# Patient Record
Sex: Male | Born: 1950 | Race: White | Hispanic: No | Marital: Married | Smoking: Former smoker
Health system: Southern US, Community
[De-identification: ages and names within clinical notes are randomized; demographics above are authoritative.]

## PROBLEM LIST (undated history)

## (undated) DIAGNOSIS — C801 Malignant (primary) neoplasm, unspecified: Secondary | ICD-10-CM

## (undated) DIAGNOSIS — Z87442 Personal history of urinary calculi: Secondary | ICD-10-CM

## (undated) DIAGNOSIS — G4730 Sleep apnea, unspecified: Secondary | ICD-10-CM

## (undated) DIAGNOSIS — M1990 Unspecified osteoarthritis, unspecified site: Secondary | ICD-10-CM

## (undated) DIAGNOSIS — I219 Acute myocardial infarction, unspecified: Secondary | ICD-10-CM

## (undated) DIAGNOSIS — G458 Other transient cerebral ischemic attacks and related syndromes: Secondary | ICD-10-CM

## (undated) DIAGNOSIS — J189 Pneumonia, unspecified organism: Secondary | ICD-10-CM

## (undated) DIAGNOSIS — M21371 Foot drop, right foot: Secondary | ICD-10-CM

## (undated) DIAGNOSIS — R011 Cardiac murmur, unspecified: Secondary | ICD-10-CM

## (undated) DIAGNOSIS — I2510 Atherosclerotic heart disease of native coronary artery without angina pectoris: Secondary | ICD-10-CM

## (undated) DIAGNOSIS — E785 Hyperlipidemia, unspecified: Secondary | ICD-10-CM

## (undated) DIAGNOSIS — H539 Unspecified visual disturbance: Secondary | ICD-10-CM

## (undated) DIAGNOSIS — I10 Essential (primary) hypertension: Secondary | ICD-10-CM

## (undated) DIAGNOSIS — M25511 Pain in right shoulder: Secondary | ICD-10-CM

## (undated) HISTORY — DX: Pain in right shoulder: M25.511

## (undated) HISTORY — DX: Hyperlipidemia, unspecified: E78.5

## (undated) HISTORY — PX: KNEE SURGERY: SHX244

## (undated) HISTORY — DX: Atherosclerotic heart disease of native coronary artery without angina pectoris: I25.10

## (undated) HISTORY — PX: TONSILLECTOMY: SUR1361

## (undated) HISTORY — DX: Unspecified osteoarthritis, unspecified site: M19.90

## (undated) HISTORY — PX: OTHER SURGICAL HISTORY: SHX169

## (undated) HISTORY — DX: Acute myocardial infarction, unspecified: I21.9

## (undated) HISTORY — DX: Unspecified visual disturbance: H53.9

## (undated) HISTORY — DX: Foot drop, right foot: M21.371

## (undated) HISTORY — DX: Essential (primary) hypertension: I10

---

## 1993-06-11 HISTORY — PX: KNEE SURGERY: SHX244

## 1993-06-11 HISTORY — PX: FRACTURE SURGERY: SHX138

## 2007-02-11 ENCOUNTER — Inpatient Hospital Stay (HOSPITAL_COMMUNITY): Admission: AD | Admit: 2007-02-11 | Discharge: 2007-02-19 | Payer: Self-pay | Admitting: Cardiology

## 2007-02-11 ENCOUNTER — Ambulatory Visit: Payer: Self-pay | Admitting: Cardiology

## 2007-02-12 ENCOUNTER — Ambulatory Visit: Payer: Self-pay | Admitting: Thoracic Surgery (Cardiothoracic Vascular Surgery)

## 2007-02-12 ENCOUNTER — Encounter: Payer: Self-pay | Admitting: Thoracic Surgery (Cardiothoracic Vascular Surgery)

## 2007-02-13 ENCOUNTER — Encounter: Payer: Self-pay | Admitting: Thoracic Surgery (Cardiothoracic Vascular Surgery)

## 2007-02-14 HISTORY — PX: CORONARY ARTERY BYPASS GRAFT: SHX141

## 2007-03-05 ENCOUNTER — Ambulatory Visit: Payer: Self-pay | Admitting: Internal Medicine

## 2007-03-18 ENCOUNTER — Encounter
Admission: RE | Admit: 2007-03-18 | Discharge: 2007-03-18 | Payer: Self-pay | Admitting: Thoracic Surgery (Cardiothoracic Vascular Surgery)

## 2007-03-18 ENCOUNTER — Ambulatory Visit: Payer: Self-pay | Admitting: Thoracic Surgery (Cardiothoracic Vascular Surgery)

## 2007-04-29 ENCOUNTER — Ambulatory Visit: Payer: Self-pay | Admitting: Cardiovascular Disease

## 2008-01-05 ENCOUNTER — Ambulatory Visit: Payer: Self-pay | Admitting: Cardiovascular Disease

## 2008-01-15 ENCOUNTER — Ambulatory Visit: Payer: Self-pay

## 2008-01-15 ENCOUNTER — Encounter: Payer: Self-pay | Admitting: Cardiovascular Disease

## 2008-09-22 ENCOUNTER — Telehealth: Payer: Self-pay | Admitting: Cardiovascular Disease

## 2008-09-28 DIAGNOSIS — R5383 Other fatigue: Secondary | ICD-10-CM

## 2008-09-28 DIAGNOSIS — E119 Type 2 diabetes mellitus without complications: Secondary | ICD-10-CM | POA: Insufficient documentation

## 2008-09-28 DIAGNOSIS — E785 Hyperlipidemia, unspecified: Secondary | ICD-10-CM | POA: Insufficient documentation

## 2008-09-28 DIAGNOSIS — M1990 Unspecified osteoarthritis, unspecified site: Secondary | ICD-10-CM | POA: Insufficient documentation

## 2008-09-28 DIAGNOSIS — R5381 Other malaise: Secondary | ICD-10-CM

## 2008-09-28 DIAGNOSIS — R0789 Other chest pain: Secondary | ICD-10-CM | POA: Insufficient documentation

## 2008-09-28 DIAGNOSIS — I10 Essential (primary) hypertension: Secondary | ICD-10-CM

## 2008-09-29 ENCOUNTER — Encounter: Payer: Self-pay | Admitting: Cardiovascular Disease

## 2008-09-29 ENCOUNTER — Ambulatory Visit: Payer: Self-pay | Admitting: Cardiovascular Disease

## 2008-09-29 DIAGNOSIS — R0989 Other specified symptoms and signs involving the circulatory and respiratory systems: Secondary | ICD-10-CM | POA: Insufficient documentation

## 2008-09-29 DIAGNOSIS — I2510 Atherosclerotic heart disease of native coronary artery without angina pectoris: Secondary | ICD-10-CM

## 2008-10-13 ENCOUNTER — Ambulatory Visit: Payer: Self-pay | Admitting: Cardiovascular Disease

## 2008-12-01 ENCOUNTER — Telehealth: Payer: Self-pay | Admitting: Cardiovascular Disease

## 2008-12-07 ENCOUNTER — Telehealth (INDEPENDENT_AMBULATORY_CARE_PROVIDER_SITE_OTHER): Payer: Self-pay | Admitting: *Deleted

## 2008-12-08 ENCOUNTER — Ambulatory Visit: Payer: Self-pay

## 2008-12-08 ENCOUNTER — Encounter: Payer: Self-pay | Admitting: Cardiology

## 2008-12-08 ENCOUNTER — Encounter: Payer: Self-pay | Admitting: Cardiovascular Disease

## 2008-12-22 ENCOUNTER — Encounter: Payer: Self-pay | Admitting: Cardiovascular Disease

## 2008-12-28 ENCOUNTER — Encounter: Payer: Self-pay | Admitting: Cardiovascular Disease

## 2009-01-05 ENCOUNTER — Encounter: Payer: Self-pay | Admitting: Cardiovascular Disease

## 2009-01-14 ENCOUNTER — Encounter: Payer: Self-pay | Admitting: Cardiovascular Disease

## 2009-02-01 ENCOUNTER — Encounter: Admission: RE | Admit: 2009-02-01 | Discharge: 2009-02-01 | Payer: Self-pay | Admitting: Endocrinology

## 2009-02-01 ENCOUNTER — Other Ambulatory Visit: Admission: RE | Admit: 2009-02-01 | Discharge: 2009-02-01 | Payer: Self-pay | Admitting: Interventional Radiology

## 2009-02-01 ENCOUNTER — Encounter (INDEPENDENT_AMBULATORY_CARE_PROVIDER_SITE_OTHER): Payer: Self-pay | Admitting: Interventional Radiology

## 2009-02-10 ENCOUNTER — Telehealth (INDEPENDENT_AMBULATORY_CARE_PROVIDER_SITE_OTHER): Payer: Self-pay | Admitting: *Deleted

## 2009-05-10 ENCOUNTER — Ambulatory Visit: Admission: RE | Admit: 2009-05-10 | Discharge: 2009-05-25 | Payer: Self-pay | Admitting: Radiation Oncology

## 2009-06-17 ENCOUNTER — Ambulatory Visit: Admission: RE | Admit: 2009-06-17 | Discharge: 2009-09-15 | Payer: Self-pay | Admitting: Radiation Oncology

## 2009-07-18 ENCOUNTER — Telehealth (INDEPENDENT_AMBULATORY_CARE_PROVIDER_SITE_OTHER): Payer: Self-pay | Admitting: *Deleted

## 2009-07-21 ENCOUNTER — Ambulatory Visit (HOSPITAL_BASED_OUTPATIENT_CLINIC_OR_DEPARTMENT_OTHER): Admission: RE | Admit: 2009-07-21 | Discharge: 2009-07-21 | Payer: Self-pay | Admitting: Urology

## 2009-08-11 ENCOUNTER — Encounter (INDEPENDENT_AMBULATORY_CARE_PROVIDER_SITE_OTHER): Payer: Self-pay | Admitting: *Deleted

## 2009-10-18 ENCOUNTER — Ambulatory Visit: Admission: RE | Admit: 2009-10-18 | Discharge: 2009-10-23 | Payer: Self-pay | Admitting: Radiation Oncology

## 2010-07-03 ENCOUNTER — Encounter: Payer: Self-pay | Admitting: Endocrinology

## 2010-07-13 NOTE — Letter (Signed)
Summary: Appointment - Reminder 2  [employer], Main Office  [address]   [place], [place] [address]   Phone: [phone]  Fax: [phone]     August 11, 2009 MRN: [id number]   BERNADETTE ARMIJO [address] [place], [place]  [address]   Dear Mr. BAUTCH,  Our records indicate that it is time to schedule a follow-up appointment with Dr. Eden Emms. It is very important that we reach you to schedule this appointment. We look forward to participating in your health care needs. Please contact us at the number listed above at your earliest convenience to schedule your appointment.  If you are unable to make an appointment at this time, give [place] a call so we can update our records.     Sincerely,   Migdalia Dk [facility] Scheduling Team

## 2010-07-13 NOTE — Progress Notes (Signed)
  Phone Note From Other Clinic   Caller: [facility] Details for Reason: Pt.Information Initial call taken by: Denny Peon    Faxed all Cardiac over to [phone] [facility]  July 18, 2009 11:10 AM

## 2010-08-18 ENCOUNTER — Encounter: Payer: Self-pay | Admitting: Cardiovascular Disease

## 2010-08-30 ENCOUNTER — Ambulatory Visit: Payer: Self-pay | Admitting: Cardiovascular Disease

## 2010-08-31 LAB — COMPREHENSIVE METABOLIC PANEL
AST: 18 U/L (ref 0–37)
Albumin: 4 g/dL (ref 3.5–5.2)
Alkaline Phosphatase: 92 U/L (ref 39–117)
CO2: 29 mEq/L (ref 19–32)
GFR calc Af Amer: >60 mL/min (ref >60)
Total Protein: 6.4 g/dL (ref 6.0–8.3)

## 2010-08-31 LAB — PROTIME-INR: Prothrombin Time: 12.4 seconds (ref 11.6–15.2)

## 2010-08-31 LAB — CBC
HCT: 39.8 % (ref 39.0–52.0)
Hemoglobin: 13.7 g/dL (ref 13.0–17.0)
MCHC: 34.4 g/dL (ref 30.0–36.0)
MCV: 95.6 fL (ref 78.0–100.0)
Platelets: 278 10*3/uL (ref 150–400)
RBC: 4.17 MIL/uL — ABNORMAL LOW (ref 4.22–5.81)
RDW: 12.9 % (ref 11.5–15.5)

## 2010-09-21 ENCOUNTER — Ambulatory Visit (INDEPENDENT_AMBULATORY_CARE_PROVIDER_SITE_OTHER): Payer: BC Managed Care – PPO | Admitting: Cardiovascular Disease

## 2010-09-21 ENCOUNTER — Encounter: Payer: Self-pay | Admitting: Cardiovascular Disease

## 2010-09-21 DIAGNOSIS — E785 Hyperlipidemia, unspecified: Secondary | ICD-10-CM

## 2010-09-21 DIAGNOSIS — R0989 Other specified symptoms and signs involving the circulatory and respiratory systems: Secondary | ICD-10-CM

## 2010-09-21 DIAGNOSIS — I2510 Atherosclerotic heart disease of native coronary artery without angina pectoris: Secondary | ICD-10-CM

## 2010-09-21 DIAGNOSIS — I10 Essential (primary) hypertension: Secondary | ICD-10-CM

## 2010-09-21 NOTE — Assessment & Plan Note (Signed)
Mild myalgias on crestor better than others F/U labs primary

## 2010-09-21 NOTE — Progress Notes (Signed)
Jason Bailey is seen today in followup for coronary artery disease.  Previous CABG . He had a nonischemic Myoview 2009 with normal LV function. He is much better than last time I saw him He has a new endocrine doctor in [place] and is off actos.  His thyroid nodules are stable.  In 4/10 he had a normal carotid duplex despite right bruit.  Active with no SSCP.  Compliant with meds.  Primary checking cholesterol  A1c in 6 range.    ROS: Denies fever, malais, weight loss, blurry vision, decreased visual acuity, cough, sputum, SOB, hemoptysis, pleuritic pain, palpitaitons, heartburn, abdominal pain, melena, lower extremity edema, claudication, or rash.   General: Affect appropriate Healthy:  appears stated age HEENT: normal Neck supple with no adenopathy JVP normal right  bruits no thyromegaly Lungs clear with no wheezing and good diaphragmatic motion Heart:  S1/S2 no murmur,rub, gallop or click PMI normal Abdomen: benighn, BS positve, no tenderness, no AAA no bruit.  No HSM or HJR Distal pulses intact with no bruits No edema Neuro non-focal Skin warm and dry No muscular weakness   Current Outpatient Prescriptions  Medication Sig Dispense Refill  . aspirin 81 MG tablet Take 81 mg by mouth daily.        . Cholecalciferol (VITAMIN D) 1000 UNITS capsule Take 1,000 Units by mouth daily.        . fish oil-omega-3 fatty acids 1000 MG capsule Take by mouth daily.        Marland Kitchen glipiZIDE (GLUCOTROL) 10 MG tablet Take 10 mg by mouth daily.        . metFORMIN (GLUMETZA) 500 MG (MOD) 24 hr tablet Take 500 mg by mouth 2 (two) times daily with a meal.        . pravastatin (PRAVACHOL) 10 MG tablet Take 10 mg by mouth daily.        . saxagliptin HCl (ONGLYZA) 5 MG TABS tablet Take 5 mg by mouth daily.        Marland Kitchen testosterone cypionate (DEPOTESTOTERONE CYPIONATE) 200 MG/ML injection Inject 50 mg into the muscle every 14 (fourteen) days.        . vitamin B-12 (CYANOCOBALAMIN) 500 MCG tablet Take 500 mcg by mouth  daily.        Marland Kitchen DISCONTD: pioglitazone (ACTOS) 45 MG tablet Take 45 mg by mouth daily.        Marland Kitchen DISCONTD: rosuvastatin (CRESTOR) 5 MG tablet Take 5 mg by mouth daily.          Allergies  Review of patient's allergies indicates no known allergies.  Electrocardiogram:  NSR 78 normal ECG  Assessment and Plan

## 2010-09-21 NOTE — Assessment & Plan Note (Signed)
Well controlled.  Continue current medications and low sodium Dash type diet.    

## 2010-09-21 NOTE — Assessment & Plan Note (Signed)
stabel no angina

## 2010-09-21 NOTE — Assessment & Plan Note (Signed)
Persistant right bruit.  No disease on duplex 2010  F/U duplex 2 years

## 2010-10-24 NOTE — Letter (Signed)
March 18, 2007   Noralyn Pick. Eden Emms, MD, [facility]  [address]  [address]  [place], [place] [address]   Re:  KENDRICKS, REAP                 DOB:  [birth year]   Dear Cindee Lame:   I saw Adrian Blackwater back in the office today.  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  At catheterization you found he had a 50% left main  stenosis and three-vessel coronary disease.  He underwent coronary  bypass grafting on September 5.  He did well in the early postoperative  period and has continued to do well since he has been discharged home.  His biggest complaints have been from muscle cramping with both Lipitor  and Zocor, so he is not currently on a statin medication because of that  intolerance.  Otherwise, he is doing quite well.   Thank you for allowing me to see Mr. Heyer and participate in his care.   Salvatore Decent Dorris Fetch, M.D.  Electronically Signed   SCH/MEDQ  D:  03/18/2007  T:  03/19/2007  Job:  [id number]   cc:   Nicolasa Ducking, MD

## 2010-10-24 NOTE — Cardiovascular Report (Signed)
NAMESILVESTRE, MINES NO.:  [id number]   MEDICAL RECORD NO.:  [id number]          PATIENT TYPE:  INP   LOCATION:  3731                         FACILITY:  [facility]   PHYSICIAN:  Noralyn Pick. Eden Emms, MD, FACCDATE OF BIRTH:  Jul 27, 1950   DATE OF PROCEDURE:  02/12/2007  DATE OF DISCHARGE:                            CARDIAC CATHETERIZATION   PROCEDURE PERFORMED:  Cardiac catheterization.   CARDIOLOGIST:  Noralyn Pick. Eden Emms, M.D., [facility]   INDICATIONS:  Unstable angina in a 60 year old diabetic.   DESCRIPTION OF THE PROCEDURE:  Cine catheterization is done with 6  French catheters from right femoral artery.  There was some oozing from  the arteriotomy site.  There was no hematoma.  At the end of the case we  had good hemostasis with Angio-Seal.   RESULTS:   ANGIOGRAPHIC DATA:  Left Main Coronary Artery:  The left main coronary  artery had a 50% eccentric stenosis.   Left Anterior Descending:  The left anterior descending artery 20-30%  multiple discrete lesions proximally.  The midvessel had 70% discrete  disease.  The distal vessel was large and graftable.  The first diagonal  branch had 70% multiple discrete lesions.   Circumflex:  The circumflex coronary artery had an 80% lesion in the  midvessel.  This was prior to the takeoff of a large first obtuse  marginal branch.  There appeared to be some left-to-right collaterals to  the distal right coronary artery.   Right Coronary Artery:  The right coronary artery had a long stenosis in  the proximal to midvessel.  I  could not tell if it was subtotally  occluded although it looked like a high-grade 95% lesion with TIMI II  flow.  Although the PDA and posterior lateral branch were small, there  appeared to be competitive flow from collaterals, and I believe the  vessel to be graftable.   Ventriculogram:  The RAO ventriculography and LAO ventriculography  showed preserved LV function with an EF of 55%.  There were no  discrete  wall motion abnormalities although the inferior basalar wall was  minimally hypokinetic.  There was no significant MR.   HEMODYNAMIC DATA:  Aortic pressure was equal to 135/62.  LV pressure was  135/11.   IMPRESSION:  The patient is a diabetic with multivessel disease  including moderate left main disease.  He has TIMI II flow in the right  coronary artery over a very long lesion, which I do not think is able to  be revascularized.  I  think his best option would be coronary bypass surgery.  I will have Dr.  Juanda Chance review the films, but  make tentative plans for CVTS to see the  patient.   The patient tolerated the procedure well.      Noralyn Pick. Eden Emms, MD, [facility]  Electronically Signed     PCN/MEDQ  D:  02/12/2007  T:  02/13/2007  Job:  [id number]

## 2010-10-24 NOTE — Assessment & Plan Note (Signed)
[facility]                            CARDIOLOGY OFFICE NOTE   NAME:Jason Bailey, Jason Bailey                        MRN:          [id number]  DATE:03/05/2007                            DOB:          1950/08/16    This is a 60 year old married white male patient who presented to the  hospital with chest pain and underwent cardiac catheterization and  eventual CABG x4 on February 14, 2007. He had a lima to the LAD, SVG to  the first diagonal, SVG to the OM1, and SVG to the PDA. The patient did  well postoperatively. The patient says that he was placed on Toprol in  the hospital and when he got home, he became extremely dizzy and  fatigued. His blood pressures were running in the 80s, so he stopped  this. He also was placed on Lipitor and he had so much muscle pain in  his upper thighs and lower extremities that he stopped this. Within  three days, he was feeling better. He had been on Lipitor before and did  not tolerate it. His wife inquires about cardiac rehab and diet as his  sugars have been running over 200 on his current treatment.   CURRENT MEDICATIONS:  1. Aspirin 325 mg daily.  2. Glipizide 5 mg daily.  3. Glucophage 500 mg 2 b.i.d.  4. Aspirin 81 mg daily.   He has stopped both his Toprol and his Lipitor.   PHYSICAL EXAMINATION:  GENERAL:  This is a pleasant 60 year old white  male in no acute distress.  VITAL SIGNS:  Blood pressure 135/81, pulse 77, weight 166.  NECK:  Without JVD, HJR, bruit, or thyroid enlargement.  LUNGS:  Decreased breath sounds at the bilateral bases. Clear elsewhere.  HEART:  Regular rate and rhythm at 77 beats-per-minute. Normal S1, S2,  positive S4, and a 1/6 systolic ejection murmur at the left sternal  border.  ABDOMEN:  Soft without organomegaly, masses, lesions, or abnormal  tenderness.  EXTREMITIES:  Without cyanosis, clubbing, or edema. He has good distal  pulses. Incision sites are all healing well.   EKG  normal sinus rhythm. Non-specific T-wave changes. No acute change.   IMPRESSION:  1. Coronary artery disease status post CABG x4 on 02/14/2007.  2. Diabetes mellitus.  3. Intolerance to LIPITOR and TOPROL.   PLAN:  I have given the patient a prescription for Zocor 20 mg daily to  see if he can tolerate this. I have also referred him to cardiac rehab  in [facility] and asked him to follow up with Dr.  Heriberto Antigua, his primary care physician, concerning his diabetes. He will  see Dr. Eden Emms back in follow up in two months and is due to see the  surgeon back next week.      Jacolyn Reedy, PA-C  Electronically Signed      Duke Salvia, MD, [facility]  Electronically Signed   ML/MedQ  DD: 03/05/2007  DT: 03/06/2007  Job #: [id number]   cc:   Salvatore Decent. Dorris Fetch, M.D.  Chrissie Noa MD Heriberto Antigua

## 2010-10-24 NOTE — Discharge Summary (Signed)
NAMEDEXTER, SIGNOR NO.:  [id number]   MEDICAL RECORD NO.:  [id number]          PATIENT TYPE:  INP   LOCATION:  2031                         FACILITY:  [facility]   PHYSICIAN:  Salvatore Decent. Dorris Fetch, M.D.DATE OF BIRTH:  1950-10-06   DATE OF ADMISSION:  02/11/2007  DATE OF DISCHARGE:  02/19/2007                               DISCHARGE SUMMARY   HISTORY OF PRESENT ILLNESS:  This patient is a 60 year old white male  with no previous history of coronary artery disease.  He awoke on the  day of admission at approximately 12:30 a.m. with left-sided chest  heaviness, diaphoresis, nausea and vomiting x1 without shortness of  breath.  The pain was approximately 4 on a scale of 1-10.  He did feel  some improvement with sitting up.  No changes in symptoms with deep  cough or inspiration.  The duration of the symptoms lasted for  approximately 2 hours.  He also noted that the day before he had some  generalized malaise.  He presented to the [facility] Emergency  Department when he developed recurrence of the chest pain.  He received  2 sublingual nitroglycerin and became painfree.  He was felt to require  further evaluation and treatment and was transferred to [facility] under the care of the [facility] Card[facility] physicians for further  evaluation and treatment including cardiac catheterization.   ALLERGIES:  NO KNOWN DRUG ALLERGIES.   MEDICATIONS PRIOR TO ADMISSION:  1. Glucophage 500 mg b.i.d.  2. Glucotrol 5 mg daily.  3. Aspirin 81 mg daily.   PAST MEDICAL HISTORY:  1. Diabetes mellitus, type 2.  2. Tobacco abuse.  3. Osteoarthritis.   FAMILY HISTORY SOCIAL HISTORY REVIEW OF SYSTEMS AND PHYSICAL EXAM:  Please see the history and physical done at the time of admission.   HOSPITAL COURSE:  The patient was admitted.  He was started on  intravenous heparin through pharmacy dosing.  He was scheduled for  cardiac catheterization, which he underwent on  February 12, 2007.  He  was found to have severe multivessel coronary artery disease.  Findings  included a 50% left main stenosis.  He also had a 70% mid LAD lesion and  a 70% diagonal #1 lesion.  The circumflex revealed a 80% midvessel  lesion and the right coronary artery showed a 90% proximal lesion.  Left  ventricular function was normal and ejection fraction was estimated at  55%.  Due to these findings, surgical consultation was felt to be  required and he was seen by Charlett Lango, MD.  He evaluated the  patient and his studies and agreed to proceed with recommendations of  surgical revascularization.  It is noted that his cardiac enzymes were  negative with a peak CK of 37 and his MB of 0.9 with a troponin and  0.02.  It is also noted to his initial cholesterol studies revealed his  HDL to be only 23 with his total at 186 and triglycerides significantly  elevated at 473.   PROCEDURE:  On February 14, 2007, he was taken to the operating  room,  where he underwent the following procedure:  Coronary artery bypass  grafting x4; the following grafts were placed:  (1) Left internal  mammary artery to the left anterior descending, (2) saphenous vein graft  to the first diagonal, (3) saphenous vein graft to the obtuse marginal  #1 and (4), a saphenous vein graft to the posterior descending.  The  patient underwent endoscopic vein harvesting from the right leg.  Additionally, the left radial artery was explored.  This was felt to be  small in size and had a minimal response to vasodilators; therefore, it  was abandoned as a conduit for bypass.  The patient tolerated the  procedure well and was taken in stable condition to the surgical  intensive care unit.   POSTOPERATIVE HOSPITAL COURSE:  The patient has done quite well.  He has  maintained stable hemodynamics.  He initially did require some inotropic  support including Neo-Synephrine and subsequent to that, Levophed, but  these  were weaned without difficulty over the early initial  postoperative period.  The patient was weaned from the ventilator  without difficulty.  All routine lines, monitors and drainage devices  have been discontinued in the standard fashion.  He is neurologically  intact.  His laboratory values do reveal a moderate acute blood loss  anemia.  His values have stabilized, but he did require transfusion.  On  February 18, 2007, his hemoglobin and hematocrit were measured at 8.6  and 24.5, respectively.  Electrolytes, BUN and creatinine are within  normal limits.  Incisions are all healing without evidence of infection.  He does have some evidence of volume overload and will require further  diuresis as an outpatient.  He is tolerating cardiac rehabilitation  phase one modalities per standard protocols.  Overall, his status is  felt to be tentatively stable for discharge the morning of February 19, 2007 pending morning round reevaluation.   MEDICATIONS AT TIME OF DISCHARGE:  1. Aspirin 325 mg daily.  2. Toprol-XL 25 mg daily.  3. Lipitor 40 mg daily.  4. Glipizide 5 mg daily.  5. Glucophage 500 mg twice daily.  6. Lasix 40 mg daily for 7 days.  7. K-Dur 20 mEq daily for 7 days.  8. For pain, Ultram 50 mg one or two every 6 hours as needed.   INSTRUCTIONS:  The patient received written instructions regarding  medications, activity, diet, wound care and followup.   FOLLOWUP:  Followup will include Dr. Dorris Fetch on March 14, 2007 with  chest x-ray as well.  Additionally, he will follow up with Dr. Dietrich Pates  at [facility] in 2 weeks.   FINAL DIAGNOSES:  1. Severe multivessel coronary disease as described above, now status      post surgical revascularization, also as described above.  2.      Acute blood loss anemia.  2. Diabetes mellitus.  3. History of tobacco abuse.  4. Other diagnoses as previously listed.      Rowe Clack, P.A.-C.      Salvatore Decent Dorris Fetch,  M.D.  Electronically Signed    WEG/MEDQ  D:  02/18/2007  T:  02/19/2007  Job:  [id number]   cc:   Wyatt Haste MD  Gerrit Friends. Dietrich Pates, MD, [facility]

## 2010-10-24 NOTE — Assessment & Plan Note (Signed)
[facility]                            CARDIOLOGY OFFICE NOTE   NAME:Jason Bailey, Jason Bailey                        MRN:          [id number]  DATE:01/05/2008                            DOB:          [birth year]    Jason Bailey is seen today at Jason request of Dr. Heriberto Antigua.   I have seen him in Jason past, most recently in September 2008 when he had  open heart surgery.   Jason Bailey is a diabetic with 3-vessel disease and normal LV function.  He underwent bypass surgery by Dr. Dorris Fetch.   Jason Bailey had been going to cardiac rehab in [facility].  He is a  Administrator by trade.  He has been having some sort of allergic  reactions.  In talking to Jason Bailey, he has had them for over 3 years.  He gets spontaneous hives and whelps and needs to take Benadryl.  Sometimes when he gets these reactions, his blood pressure drops too low  and this causes dizziness.  This is clearly not a cardiac problem.   He has had occasional atypical chest pain.  It actually sounds more  epigastric in nature.  It is nonexertional.  He did have one episode of  exertional chest pain, which was sharp and in Jason middle of his chest  last week.   In talking to Jason Bailey, there was a question of some of these  reactions being precipitated by food.  However, he has never had food  allergy testing and I do not know that there is a rhyme or reason to any  specific food.  He specifically states that they are clearly not related  to peanuts.   Jason Bailey is a longstanding diabetic.  He feels a tickling when it is  hot out.  He may be on too much medication; however, they have been no  documented hypoglycemic reactions.   Jason Bailey has just not felt well over Jason past 5 weeks.  He primarily  complains of fatigue and malaise.  I reviewed Jason lab work from Dr.  Sherrill Raring office.  His sed rate was 5.  His CBC count was normal.  There  was no evidence of other abnormalities.   I did not  notice any thyroid studies.   His review of systems is otherwise negative.  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   His only medications include glipizide and Glucophage as well as fish  oil.   PHYSICAL EXAMINATION:  GENERAL:  Remarkable for a healthy-appearing  middle-aged white male in no distress.  VITAL SIGNS:  Weight is 171, blood pressure is 140/70, pulse 74 and  regular, it is not postural, respiratory rate 14, afebrile.  HEENT:  Unremarkable.  No thyromegaly, lymphadenopathy.  No JVP elevation.  No  tenderness.  LUNGS:  Clear with good diaphragmatic motion.  No wheezing.  S1, S2  normal heart sounds.  Sternotomy is well healed. ABDOMEN:  Benign.  Bowel sounds positive.  No AAA, no tenderness, no bruit, no  hepatosplenomegaly or hepatojugular reflux.  EXTREMITIES:  Distal pulses are intact, no edema.  NEURO:  Nonfocal.  SKIN:  Warm and dry.  No muscular weakness.   EKG shows sinus rhythm with no acute changes.   Again, lab work from Dr. Sherrill Raring office reviewed.   IMPRESSION:  1. Malaise and fatigue, not likely due to his heart.  Check 2-D      echocardiogram to make sure right ventricular and left ventricular      function normal.  2. Spontaneous hives and question of allergic reaction.  Jason Bailey      may have some sort of mast cell disease.  We will refer him to Jason      allergist.  This seems to be his biggest problem.  Clearly, mast      cell release would account for his whelps and relative low blood      pressure.  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  3. Diabetes.  We gave Jason Bailey Jason name of some endocrinologists.      He really does not have a primary care doctor in [facility], Dr.      Heriberto Antigua; I believe is a Careers adviser.  He was inquiring  and I will give      him Jason names of Dr. Talmage Nap and Dr. Leslie Dales, Dr. Roanna Raider, and Dr.      Evlyn Kanner.  He will continue his current Glucophage and glipizide and      monitor his sugars and make sure that he is not having any      hypoglycemic reactions.  4. Previous coronary artery bypass surgery in Jason setting of diabetes.      Check stress Myoview.  We will make sure he has no abnormal blood      pressure responses to exercise and make sure there is not premature      graft failure.  As long as his echo and Myoview are normal, I will      see him back in 6 months, but I think that there may be an issue      regarding allergies or mast cell disease.     Noralyn Pick. Eden Emms, MD, [facility]  Electronically Signed    PCN/MedQ  DD: 01/05/2008  DT: 01/06/2008  Job #: [id number]

## 2010-10-24 NOTE — Consult Note (Signed)
[name], Bailey NO.:  [id number]   MEDICAL RECORD NO.:  [id number]          PATIENT TYPE:  INP   LOCATION:  2807                         FACILITY:  [facility]   PHYSICIAN:  Salvatore Decent. Dorris Fetch, M.D.DATE OF BIRTH:  07-14-1950   DATE OF CONSULTATION:  02/12/2007  DATE OF DISCHARGE:                                 CONSULTATION   REASON FOR CONSULTATION:  Left main and three-vessel disease.   HISTORY OF PRESENT ILLNESS:  Jason Bailey is a 60 year old white male who has  no prior cardiac history.  He does have a history of adult-onset non-  insulin-dependent diabetes and tobacco abuse.  He was awakened  approximately midnight September 1 with left-sided chest heaviness  associated with diaphoresis, nausea and vomiting.  There was no  shortness of breath.  He said it was about a 4/10 pain.  He was unable  to get comfortable but after about 2 hours the pain resolved and he felt  better.  On the morning of February 11, 2007, he felt dizzy and was  taken to the emergency room, where he was given sublingual nitroglycerin  x2 with resolution of his pain.  He was then transferred to [facility]  for further evaluation.  He was ruled out for an MI but given his risk  factors and his presentation he underwent cardiac catheterization today,  which showed 50% left main stenosis, three-vessel coronary disease and  an ejection fraction of 55%.   PAST MEDICAL HISTORY:  Significant for adult-onset diabetes for 14  years.  He denies hypertension, hyperlipidemia.  He does have  osteoarthritis and has right foot drop secondary to a previous injury  with a tree falling on him.  He also has right shoulder problems but no  functional problems with his right hand.   MEDICATIONS AT THE TIME OF ADMISSION:  1. Glucophage 500 mg b.i.d.  2. Glipizide 5 mg daily.  3. Aspirin 81 mg daily.   He has no known drug allergies.   FAMILY HISTORY:  Both parents died of cancer.  No history of  coronary  disease.   SOCIAL HISTORY:  He lives in [place] with his wife.  He works in  Aeronautical engineer.  He smokes about half-a-pack a day and has for the last 40  years.  He is active and walks on a regular.  He is not able to run due  to his foot drop.  His job does involve physical labor.   REVIEW OF SYSTEMS:  See HPI.  He does use reading glasses.  He had the  dizziness but no true syncope.  He does sometimes get cramping,  particularly his right leg with exertion, no true claudication.  No  history of excessive bleeding or bruising.  All other systems are  negative.   PHYSICAL EXAMINATION:  Jason Bailey is a 60 year old white male in no acute  distress.  NEUROLOGICAL:  He is alert and x3, he is appropriate and grossly intact.  HEENT:  Unremarkable.  NECK:  Supple without thyromegaly, adenopathy or bruits.  CARDIAC:  A regular rate and  rhythm, normal S1 and S2.  No rubs, murmurs  or gallops.  ABDOMEN:  Soft and nontender.  EXTREMITIES:  Without clubbing, cyanosis or edema.  He does have some  atrophy in his right calf compared to the left and has a right foot  drop.  He has 2+ pulses throughout.  He has a normal Allen test on the  left.   LABORATORY DATA:  Cardiac catheterization as previously mentioned.  His  cardiac enzymes were negative with a peak CK of 37, MB of 0.9, troponin  of 0.02.  His cholesterol was 186.  His HDL was low at 23.  Triglycerides were elevated at 473.  Hematocrit 40, white count 7.8,  platelets 302.  Sodium 136, potassium 4.0.  BUN and creatinine 9 and  0.4, glucose is 143.  PT 13.6.  EKG shows nonspecific T-wave  abnormalities.   IMPRESSION:  Jason Bailey is a 59 year old gentleman with multiple cardiac  risk factors who presents with an unstable coronary syndrome.  He ruled  out for myocardial infarction by enzymes but at catheterization has a  50% left main stenosis and severe three-vessel coronary disease with  preserved left ventricular function.   Coronary artery bypass grafting is  indicated for survival benefit and relief of symptoms.  I have discussed  in detail with the patient and his wife and daughter the indications,  risks, benefits and alternatives.  He understands the risks include but  are not limited to death, stroke, MI, DVT, PE, bleeding, possible need  for transfusions, infection, as well as other organ system dysfunction  including respiratory, renal or GI complications.  He also understands  the palliative nature of coronary artery bypass graft and the need for  long-term lifestyle modification, particularly smoking cessation and  tight control of his diabetes and diet for his long-term outcome.  He  does understand that given his young age that there is a high likelihood  he will need additional cardiac procedures in the future.  We did  discuss the potential for radial artery grafting.  He has a normal Allen  test confirmed with pulse oximetry and plethysonography.  Given his  young age, I think a left radial artery to his circumflex in addition to  his mammary to his left anterior descending artery will be important.  He does have poor targets on the right but likely graftable.  He also  needs a graft to the diagonal branch of the left anterior descending  artery.  [name] understands and accepts the risks of surgery.  He  wishes to proceed.  We will proceed with the first available OR, which  is Friday, September 5.      Salvatore Decent Dorris Fetch, M.D.  Electronically Signed     SCH/MEDQ  D:  02/12/2007  T:  02/13/2007  Job:  [id number]   cc:   Gerrit Friends. Dietrich Pates, MD, [facility]  Nicolasa Ducking, MD

## 2010-10-24 NOTE — Op Note (Signed)
NAMETERRENCE, WISHON NO.:  [id number]   MEDICAL RECORD NO.:  [id number]          PATIENT TYPE:  INP   LOCATION:  2313                         FACILITY:  [facility]   PHYSICIAN:  Salvatore Decent. Dorris Fetch, M.D.DATE OF BIRTH:  12/27/1950   DATE OF PROCEDURE:  02/14/2007  DATE OF DISCHARGE:                               OPERATIVE REPORT   PREOPERATIVE DIAGNOSIS:  Three-vessel coronary disease with unstable  angina.   POSTOPERATIVE DIAGNOSIS:  Three-vessel coronary disease with unstable  angina.   PROCEDURE:  Median sternotomy, extracorporeal circulation, coronary  artery bypass grafting x4 (left internal mammary artery to left anterior  descending artery, saphenous vein graft to first diagonal, saphenous  vein graft to obtuse marginal #1, saphenous vein graft to posterior  descending), endoscopic vein harvest, right leg.   SURGEON:  Salvatore Decent. Dorris Fetch, M.D.   ASSISTANT:  Rowe Clack, P.A.-C.   ANESTHESIA:  General.   FINDINGS:  Left radial small with minimal response to vasodilators,  therefore not utilized.  Saphenous vein of good quality, mammary artery  good-quality,  posterior descending, fair target.  Remaining targets  good-quality.   CLINICAL NOTE:  Mr. Kervin is a 60 year old white male who presents with  unstable anginal symptoms.  At catheterization he was found to have 50%  left main stenosis and three-vessel coronary disease.  He was advised to  undergo coronary artery bypass grafting.  The indications, benefits and  alternatives were discussed in detail with the patient.  He understood  and accepted the risks and agreed to proceed.  The patient was advised  that we would attempt to use the left radial artery.  He had a normal  Allen test preoperatively.   OPERATIVE NOTE:  Mr. Shahin was brought the preop holding area on February 14, 2007.  There the anesthesia service placed lines for monitoring  arterial, central venous and pulmonary arterial  pressure.  ECG leads  were placed for continuous telemetry and intravenous antibiotics were  administered.  He was taken to the operating room, anesthetized and  intubated, a Foley catheter was placed, and the chest, abdomen, legs and  left arm were prepped and draped in the usual fashion.  A curvilinear  incision was made in the volar aspect of the left arm overlying the  radial pulse.  This was carried through the skin and subcutaneous  tissue.  Hemostasis was achieved with the harmonic scalpel.  The distal  segment of the artery had been apparently used for an arterial blood gas  preoperatively and there was hematoma in that area.  The vessel was  relatively small at the wrist.  As the dissection was carried more  proximally it was apparent that the vessel was small throughout its  course.  There was concern for spasm.  Papaverine was applied but there  was no real response.  The vessel was felt to be too small to be  suitable for use as a coronary bypass graft.  The vessel did have a good  pulse; therefore, the incision was closed in two layers with a running 3-  0 Vicryl subcutaneous suture and running 3-0 Vicryl subcuticular suture.  An incision was made in the medial aspect of the right leg at the level  of the knee and the greater saphenous vein was harvested from the groin  to the mid calf.  This was a good-quality conduit.  Simultaneously a  median sternotomy was performed and the left internal mammary artery was  harvested using standard technique.  This also a good-quality conduit.  Five thousand units of heparin was administered during the vessel  harvest.  The remainder of the full heparin dose was given prior to  opening the pericardium.   The pericardium was opened.  The ascending aorta was inspected.  There  was no evidence of atherosclerotic disease.  The aorta was cannulated  via concentric 2-0 Ethibond pledgeted pursestring sutures.  A dual-stage  venous cannula  placed via a pursestring suture in the right atrial  appendage.  Cardiopulmonary bypass was instituted and the patient was  cooled to 32 degrees Celsius.  The coronary arteries were inspected and  anastomotic sites were chosen.  The conduits were inspected and cut to  length.  A foam pad was placed in the pericardium to the left phrenic  nerve.  A temperature probe was placed in the myocardial septum and a  cardioplegia cannula was placed in the ascending aorta.   The aorta was crossclamped.  The left ventricle was emptied via the  aortic root vent.  Cardiac arrest then was achieved with a combination  of cold antegrade blood cardioplegia and topical iced saline.  After  achieving a complete diastolic arrest and adequate myocardial septal  cooling, the following distal anastomoses were performed:   First a reversed saphenous vein graft was placed end-to-side to the to  posterior descending branch of the right coronary.  This was a 1.5-mm  fair-quality target.  The vein graft was anastomosed with a running 7-0  Prolene suture.  There was excellent flow.  All anastomoses were probed  proximally and distally at their completion.  There was good flow  through the graft with cardioplegia administration and good hemostasis.   Next a reversed saphenous vein graft was placed end-to-side to the first  obtuse marginal branch of the left circumflex.  This was a 1.5-mm good-  quality target.  The vein graft was of good quality and the anastomosis  was performed with a running 7-0 Prolene suture.   Next a reversed saphenous vein graft was placed end-to-side to the first  diagonal branch of the LAD.  This was also a 1.5 mm good-quality target  that bifurcated into two smaller 1-mm branches just beyond the  anastomosis.  Again the anastomosis was performed with a running 7-0  Prolene suture.   Next the left internal mammary artery was brought through a window in  the pericardium and the distal end  was beveled and was anastomosed end-  to-side to the distal LAD.  The LAD was a 2-mm good-quality target.  The  mammary was a 2 mm good-quality conduit.  The anastomosis was performed  with a running 8-0 Prolene suture.  At the completion of the mammary to  LAD anastomosis the bulldog clamp was briefly removed to inspect for  hemostasis.  Immediate and rapid septal rewarming was noted.  The  bulldog clamp was replaced.  The mammary pedicle was tacked to the  epicardial surface of the heart with 6-0 Prolene sutures.   Additional cardioplegia was administered down the aortic root and the  vein grafts.  The vein grafts were cut to length.  The cardioplegia  cannula then was removed from the ascending aorta and the proximal vein  graft anastomoses were performed to 4.5 mm punch aortotomies with  running 6-0 Prolene sutures.  At the completion of the final proximal  anastomosis, the patient was placed in Trendelenburg position.  Lidocaine was administered.  The bulldog clamp was again removed from  the left mammary artery and immediate and rapid septal rewarming was  again noted.  The aortic root was de-aired and the aortic crossclamp was  removed.  The total crossclamp time was 65 minutes.  The patient  initially fibrillated and required a single defibrillation with 20  joules.  He remained in sinus rhythm thereafter.   All proximal and distal anastomoses were inspected for hemostasis.  Epicardial pacing wires were placed on the right ventricle and right  atrium.  After the patient had rewarmed to a core temperature of 37  degrees Celsius, he was weaned from cardiopulmonary bypass on the first  attempt without difficulty.  The total bypass time was 110 minutes.  The  initial cardiac index greater than 2 L/min. per sq. m and the patient  remained hemodynamically stable throughout the post-bypass period.   A test dose of protamine was administered and was well-tolerated.  The  atrial and  aortic cannulae were removed.  The remainder of the protamine  was administered without incident.  The chest was irrigated with 1 L of  warm normal saline containing 1 g of vancomycin.  Hemostasis was  achieved.  A left pleural and two mediastinal chest tubes were placed  through separate subcostal incisions.  The pericardium was  reapproximated with interrupted 3-0 silk sutures.  It came together  easily without tension.  The sternum was closed with interrupted heavy-  gauge stainless steel wires.  The remainder of the incisions were closed  in standard fashion.  Subcuticular closure was used on the skin.  All  sponge, needle and instruments counts were correct at the end of  procedure.  The patient was taken from the operating room to the  surgical intensive care unit in fair condition.      Salvatore Decent Dorris Fetch, M.D.  Electronically Signed     SCH/MEDQ  D:  02/14/2007  T:  02/14/2007  Job:  [id number]   cc:   Gerrit Friends. Dietrich Pates, MD, [facility]  Noralyn Pick. Eden Emms, MD, [facility]  Nicolasa Ducking, MD

## 2010-10-24 NOTE — Assessment & Plan Note (Signed)
[facility]                            CARDIOLOGY OFFICE NOTE   NAME:Jason Bailey                        MRN:          [id number]  DATE:04/29/2007                            DOB:          September 04, 1950    [name] is seen today in followup.  Unfortunately, I do not really know  the patient well; I had to go back and look at his cath films and read  all of his hospital records.  He was admitted by Dr. Dietrich Pates and cared  for by him in the hospital.  I did do his heart catheterization.  The  patient presented with unstable angina.  He had left main and three-  vessel disease.  He is a diabetic.  His EF was in the 50% range.  He  underwent CABG by Dr. Dorris Fetch.  On reading through his notes,  apparently they tried to harvest his radial artery, but was too small to  be used for a bypass.  He had endoscopic harvesting of the right lower  extremity veins and a LIMA.  His postoperative course was fairly  unremarkable.  The patient has followed up with Rehab in [facility].  He  has his own landscaping business and had disability benefit forms that  had to be filled out; time to fill out was 15-20 minutes.   The patient has had some atypical pain since his discharge; this is  clearly related to his sternotomy, particularly in the left lower rib  area.  He has had improvement with anti-inflammatories; otherwise, he  has not had any PND or orthopnea.  He kept his followup appointment with  Dr. Dorris Fetch.  He said his chest x-ray looked fine and he was  released to follow up with [place].   The patient has gone back to work.  He is not lifting anything more than  20 pounds.   In regards to his risk factor, he has been somewhat slack with his  diabetes; he is really not checking his sugars every day.  In the  hospital prior to his outpatient followup, his hemoglobin A1c was in the  9 range.  Apparently, his Glucophage was just increased to 2 tablets  b.i.d. and  he has continued on his glipizide and I explained to him the  relationship between his diabetes, tight sugar control and a hemoglobin  A1c goal of 6 and keeping his bypass grafts open.  Fortunately, since he  has left the hospital, he has stopped smoking.   His blood pressure has been under good control and I reviewed his  records from [facility] and cardiac rehab and these seem to be fine.   Unfortunately, in regards to his cholesterol, he is intolerant to  stands.  He has been on Zocor and Lipitor and has severe myalgias.   The Lipitor was stopped by Dr. Dorris Fetch.  He apparently told the  patient his LDL was not that bad.  In talking to the patient, I told him  I would check his labs from the hospital, since they are currently not  in  the chart.  He was encouraged to take Niacin and fish oil over the  counter for the time-being.  At some point in the future, we may try him  on Zetia, although isolated use would probably increase liver  production.   REVIEW OF SYSTEMS:  His review of systems is otherwise remarkable for  some chronic right lower extremity pain from an accident involving a  knee injury in a crush accident from a tree; he wears a splint on the  back of his right leg chronically.   CURRENT MEDICATIONS:  1. Glipizide 5 a day.  2. Glucophage 1 g b.i.d.  3. An aspirin a day.   ALLERGIES:  He has no known allergies.   PHYSICAL EXAMINATION:  His exam is remarkable for a healthy-appearing  middle-age white male in no distress.  Weight is 174.  Blood pressure is 140/80.  Pulse is 70 and regular.  Afebrile.  Respiratory rate 14.  HEENT:  Unremarkable.  Carotids are normal without bruit.  No lymphadenopathy, thyromegaly or  JVP elevation.  LUNGS:  Clear with good diaphragmatic motion, no wheezing.  Sternum well healed.  S1 and S2 with no rub.  PMI normal.  No pain or evidence of fractured  ribs, particularly in the left lower cage.  ABDOMEN:  Benign.  No AAA.  Bowel  sounds are positive.  No  hepatosplenomegaly or hepatojugular reflux.  No AAA.  Femorals are +3 bilaterally.  PTs are +3. There is no neuropathy.  He  has a splint behind the right calf.  There is a mild weakness to flexion  and extension in the right foot, which is chronic.  His right saphenous  vein endoscopic vein harvest site is well healed.  His left arm has a  radial excision scar, but the radial pulse is intact and I guess was not  harvested at all.   IMPRESSION:  1. Diabetic with coronary disease, post coronary artery bypass graft,      recovering well.  He will continue aspirin therapy, currently no      indication for beta blocker therapy.  He has had fatigue and      excessive bradycardia on this before.  2. Hyperlipidemia.  Continue dietary consultation.  Avoid in between      meals.  Try Niacin and fish oil.  Followup lipid and liver profile      with Dr. Heriberto Antigua in 6 months.  3. Diabetes.  Follow up with Dr. Heriberto Antigua.  Glucophage apparently      recently increased.  Check hemoglobin A1c in a month or 2.      Continue to escalate drugs as needed to enhance longlasting bypass      grafts.  4. Orthopedic problem with previous crush injury.  Continue to wear      splints.  Physical therapy and occupational therapy as needed.  5. Smoking:  The patient is currently abstaining; he knows how      important this is.  I told him to call us if he thinks that he is      going to back to this and we can prescribe him either Wellbutrin,      Chantix or nicotine replacement.  6. Overall, the patient is doing well.  I spent quite a bit of time      with him today, since I really did not know him.  I had to review      his hospital records, fill out his disability forms and  review his      heart catheterization.   I will see him back in 6 months.  Being a diabetic, I would think that  he would need a yearly Myoview study for surveillance of his grafts,  particularly if his risk factors  continue to more poorly modified.     Noralyn Pick. Eden Emms, MD, [facility]  Electronically Signed    PCN/MedQ  DD: 04/29/2007  DT: 04/30/2007  Job #: [id number]

## 2010-10-24 NOTE — Assessment & Plan Note (Signed)
OFFICE VISIT   DEMONT, LINFORD  DOB:  [birth year]                                        March 18, 2007  CHART #:  [id number]   HISTORY OF PRESENT ILLNESS:  Mr. Glance is a 60 year old gentleman who  recently was admitted with unstable angina. At catheterization, he had 3  vessel coronary disease and a 50% left main stenosis. He underwent  coronary bypass grafting x4 on February 14, 2007. We explored the radial  artery but it was a small vessel and did not respond to vasodilators and  therefore, was not utilized. We used saphenous vein in addition to his  left mammary. Postoperatively, Mr. Galli did well and was discharged home  on postoperative day 4. Since that time, he had a problem with Lipitor,  which caused muscle cramping in his legs. He subsequently was changed to  Zocor, which again caused muscle cramping. He has been off that now for  2 or 3 days. He has had minimal incisional discomfort. He has not had to  take any pain medication. He has had some difficulty sleeping.   PHYSICAL EXAMINATION:  GENERAL:  Mr. Dobrowski is a 60 year old white male in  no acute distress.  VITAL SIGNS:  Blood pressure 130/79, pulse 78, respiratory rate 18.  Oxygen saturation is 97% on room air.  SKIN:  There is a macular papillare rash over the trunk, more prominent  on the chest than the back.  CHEST:  The sternal incision is clean, dry, and intact. The sternum is  stable. Chest tube site on the left is open with some Eschar but no  signs of infection.  EXTREMITIES:  His radial artery incision is well healed. His hand is  neurologic intact with good capillary refill. His leg incision is  healing well.   DIAGNOSTIC STUDIES:  Chest x-ray shows improving aeration with no  significant effusions.   IMPRESSION:  Mr. Mancia is a 60 year old gentleman. He is now about a month  out for coronary bypass grafting. He is doing very well at this point in  time. He is having a lot less pain  than most younger men have. I do not  know if this is related to diabetic neuropathy or what but he really has  minimal discomfort. His activity level is good. He is anxious to  increase his activity levels. He has had some trouble with his sleep. I  offered to give him a prescription for a sleep medication. He did not  wish to do that. I did tell him that he can try Benadryl or some other  diphenhydramine preparation if he wanted to give that a try. He says it  seems to be getting slightly better over the past few days. He was going  to start cardiac rehabilitation next week. He is scheduled for  orientation next Wednesday. He should be able to proceed with that  without difficulty. Unfortunately, he has now failed 2 Statins, both  Lipitor and Zocor caused muscle cramping and those have been  discontinued. I will leave it up to the cardiologists discretion,  whether they would like to try any other anti lipid therapy.   ACTIVITY:  I gave Mr. Schopf instructions on driving. He may begin driving  but with strict precautions for the next several weeks. He is still not  to lift any objects greater than 10 pounds or do any heavy work for at  least another 2 weeks.   FOLLOWUP:  He will continue to be followed by [facility] here in  [place]. I will be happy to see him back at any time, if I can be of  any further assistance for his care.   Salvatore Decent Dorris Fetch, M.D.  Electronically Signed   SCH/MEDQ  D:  03/18/2007  T:  03/18/2007  Job:  [id number]   cc:   Noralyn Pick. Eden Emms, MD, [facility]  Nicolasa Ducking, M.D.

## 2010-10-24 NOTE — H&P (Signed)
[name], Jason Bailey NO.:  [id number]   MEDICAL RECORD NO.:  [id number]          PATIENT TYPE:  INP   LOCATION:  3731                         FACILITY:  [facility]   PHYSICIAN:  Gerrit Friends. Dietrich Pates, MD, FACCDATE OF BIRTH:  May 25, 1951   DATE OF ADMISSION:  02/11/2007  DATE OF DISCHARGE:                              HISTORY & PHYSICAL   REFERRING:  Dr. Livingston Diones from [place].   HISTORY OF PRESENT ILLNESS:  A 60 year old gentleman with recent  episodes of chest discomfort referred from the [facility] for further evaluation and treatment.  Jason Bailey has multiple  cardiovascular risk factors, most notably diabetes for the past 14  years.  He has been advised to take a lipid lowering agents, but has not  had frank hyperlipidemia.  He does use tobacco products with a total  consumption of 20 pack-years.  There is no prominent family history for  coronary disease.  He developed the sudden onset of mild to moderate  left chest heaviness early in the morning on the day prior to admission.  This awakened him from sleep.  There was a clammy sensation, as well as  nausea and one episode of emesis.  The discomfort persisted for  approximately 2 hours and then resolved gradually.  There was no  associated dyspnea.  He also has had dizziness in recent weeks that is  typically orthostatic and occurs morning, and that he relates to his  chest discomfort.  He subsequently did well, went to work on the day of  admission, but had dizziness with some recurrent left upper chest  discomfort without radiation prompting him to come to the emergency  department.  His symptoms were promptly relieved with sublingual  nitroglycerin.  He has felt well since, after her treatment with heparin  was initiated.   Past medical history is generally benign.  He has had orthopedic  procedures on the left shoulder and right knee after an injury work.   HE HAS NO KNOWN  ALLERGIES.   CURRENT MEDICATIONS:  Include only metformin b.i.d., and a second  medicine for diabetes, the name of which he cannot recall.  He also  takes aspirin 81 mg daily.   SOCIAL HISTORY:  No excessive alcohol; works in [employer];  physically active, generally without symptoms.   FAMILY HISTORY:  Mother died at age 40 due to neoplastic disease; father  died at age 79, also due to neoplastic disease.  He has one living  sister who is well.   Review of systems is notable for decreased mobility of the right ankle,  mild arthralgias, and the need for corrective lenses for reading.   GENERAL APPEARANCE:  On exam, pleasant, tanned, well-appearing  gentleman.  VITAL SIGNS:  The heart rate is 80 and regular, respirations 14, blood  pressure 120/70.  Afebrile.  EENT:  Anicteric sclerae; normal oral mucosa.  NECK:  No jugular venous distention; normal carotid upstrokes without  bruits.  ENDOCRINE:  No thyromegaly.  HEMATOPOIETIC:  No adenopathy.  SKIN:  No significant lesions.  PSYCHIATRIC:  Alert and  oriented; normal affect.  MUSCULOSKELETAL::  Decreased mobility of the right ankle.  CARDIAC:  Normal first and second heart sounds; fourth heart sound  present.  Normal PMI.  ABDOMEN:  Soft and nontender; liver edge 2 cm below the right costal  margin; smooth and firm.  LUNGS:  Clear.  EXTREMITIES:  Normal distal pulses; no edema.   EKG:  Normal sinus rhythm; shallow inferior T-wave inversions.   Other laboratory from [facility] is normal including a CBC, chemistry  profile, coagulation studies, urinalysis and cardiac markers.   IMPRESSION:  [name] is actually more concerned about as lightheadedness  than his chest discomfort.  We will rule out orthostatic hypotension,  but reserve any possible neurologic assessment until his cardiac workup  is complete.  His chest discomfort is worrisome in quality.  His EKG is  not entirely normal.  He has substantial cardiovascular  risk.  Accordingly, I believe that coronary angiography is warranted for  definitive diagnosis and plan optimal therapy.  The risks and benefits  that procedure were described to the patient, who agrees to proceed.  We  will obtain additional cardiac markers to rule out myocardial damage.  EKG will be repeated.  We will assess lipid profile and start a statin  drug.  His initial therapy will consist of a beta blocker, aspirin and  intravenous heparin.  Intravenous nitroglycerin will be added should  symptoms recur.      Gerrit Friends. Dietrich Pates, MD, [facility]  Electronically Signed     RMR/MEDQ  D:  02/11/2007  T:  02/12/2007  Job:  [id number]

## 2011-03-23 LAB — POCT I-STAT 3, VENOUS BLOOD GAS (G3P V)
Acid-base deficit: 2
Bicarbonate: 23.7
O2 Saturation: 81
TCO2: 25
pH, Ven: 7.34 — ABNORMAL HIGH

## 2011-03-23 LAB — CROSSMATCH: ABO/RH(D): O NEG

## 2011-03-23 LAB — POCT I-STAT 4, (NA,K, GLUC, HGB,HCT)
Glucose, Bld: 120 — ABNORMAL HIGH
Glucose, Bld: 120 — ABNORMAL HIGH
Glucose, Bld: 129 — ABNORMAL HIGH
Glucose, Bld: 144 — ABNORMAL HIGH
Glucose, Bld: 91
HCT: 23 — ABNORMAL LOW
HCT: 23 — ABNORMAL LOW
HCT: 24 — ABNORMAL LOW
HCT: 35 — ABNORMAL LOW
HCT: 36 — ABNORMAL LOW
Hemoglobin: 11.2 — ABNORMAL LOW
Hemoglobin: 11.9 — ABNORMAL LOW
Hemoglobin: 7.8 — CL
Hemoglobin: 8.2 — ABNORMAL LOW
Operator id: 203371
Operator id: 3291
Operator id: 3291
Operator id: 3291
Potassium: 3.5
Potassium: 4
Potassium: 4
Potassium: 4.4
Potassium: 5
Sodium: 134 — ABNORMAL LOW
Sodium: 135
Sodium: 136
Sodium: 142

## 2011-03-23 LAB — BASIC METABOLIC PANEL
BUN: 5 — ABNORMAL LOW
BUN: 7
BUN: 9
BUN: 9
CO2: 26
Calcium: 7.4 — ABNORMAL LOW
Calcium: 8 — ABNORMAL LOW
Chloride: 109
Creatinine, Ser: 0.41
Creatinine, Ser: 0.48
Creatinine, Ser: 0.51
GFR calc Af Amer: >60
GFR calc Af Amer: >60
GFR calc Af Amer: >60
GFR calc non Af Amer: >60
GFR calc non Af Amer: >60
GFR calc non Af Amer: >60
GFR calc non Af Amer: >60
Glucose, Bld: 112 — ABNORMAL HIGH
Glucose, Bld: 133 — ABNORMAL HIGH
Glucose, Bld: 143 — ABNORMAL HIGH
Potassium: 3.7
Potassium: 3.7
Potassium: 4
Sodium: 136

## 2011-03-23 LAB — BLOOD GAS, ARTERIAL
Acid-Base Excess: 1.3
FIO2: 0.21
pCO2 arterial: 39.4
pH, Arterial: 7.423
pO2, Arterial: 72.1 — ABNORMAL LOW

## 2011-03-23 LAB — CREATININE, SERUM
Creatinine, Ser: 0.45
Creatinine, Ser: 0.48
GFR calc Af Amer: >60
GFR calc non Af Amer: >60
GFR calc non Af Amer: >60

## 2011-03-23 LAB — I-STAT EC8
Acid-Base Excess: 1
Chloride: 107
HCT: 27 — ABNORMAL LOW
Operator id: 279171
Potassium: 4.1
TCO2: 25
pCO2 arterial: 31.3 — ABNORMAL LOW
pH, Arterial: 7.497 — ABNORMAL HIGH

## 2011-03-23 LAB — POCT I-STAT 3, ART BLOOD GAS (G3+)
Bicarbonate: 24.7 — ABNORMAL HIGH
Bicarbonate: 26 — ABNORMAL HIGH
Bicarbonate: 27.3 — ABNORMAL HIGH
O2 Saturation: 100
O2 Saturation: 100
Operator id: 203371
Operator id: 279171
Operator id: 3291
Patient temperature: 35.7
TCO2: 26
pCO2 arterial: 31.2 — ABNORMAL LOW
pCO2 arterial: 41.8
pCO2 arterial: 41.9
pH, Arterial: 7.379
pH, Arterial: 7.462 — ABNORMAL HIGH
pH, Arterial: 7.492 — ABNORMAL HIGH
pO2, Arterial: 164 — ABNORMAL HIGH
pO2, Arterial: 231 — ABNORMAL HIGH
pO2, Arterial: 331 — ABNORMAL HIGH

## 2011-03-23 LAB — CBC
HCT: 19.7 — ABNORMAL LOW
HCT: 22.8 — ABNORMAL LOW
HCT: 23.8 — ABNORMAL LOW
HCT: 23.9 — ABNORMAL LOW
HCT: 25 — ABNORMAL LOW
HCT: 25.9 — ABNORMAL LOW
HCT: 39.6
Hemoglobin: 13.2
Hemoglobin: 13.8
Hemoglobin: 8.6 — ABNORMAL LOW
Hemoglobin: 8.7 — ABNORMAL LOW
Hemoglobin: 8.9 — ABNORMAL LOW
MCHC: 34.6
MCHC: 34.8
MCHC: 34.9
MCHC: 35
MCV: 93.1
MCV: 93.1
Platelets: 156
Platelets: 162
Platelets: 172
Platelets: 175
Platelets: 177
Platelets: 317
RBC: 2.05 — ABNORMAL LOW
RBC: 2.65 — ABNORMAL LOW
RBC: 2.78 — ABNORMAL LOW
RBC: 3.09 — ABNORMAL LOW
RBC: 4.05 — ABNORMAL LOW
RBC: 4.28
RDW: 12.6
RDW: 12.9
RDW: 13
RDW: 13
RDW: 13.1
RDW: 13.3
RDW: 14.5 — ABNORMAL HIGH
WBC: 10.8 — ABNORMAL HIGH
WBC: 10.8 — ABNORMAL HIGH
WBC: 6.2
WBC: 7.1
WBC: 7.1
WBC: 9
WBC: 9
WBC: 9.7

## 2011-03-23 LAB — COMPREHENSIVE METABOLIC PANEL
ALT: 18
Albumin: 3.7
Alkaline Phosphatase: 104
BUN: 6
Calcium: 9.1
Potassium: 4.3
Sodium: 138
Total Protein: 6.4

## 2011-03-23 LAB — ABO/RH: ABO/RH(D): O NEG

## 2011-03-23 LAB — CARDIAC PANEL(CRET KIN+CKTOT+MB+TROPI)
CK, MB: 0.9
Relative Index: INVALID
Total CK: 37

## 2011-03-23 LAB — TYPE AND SCREEN
ABO/RH(D): O NEG
Antibody Screen: NEGATIVE

## 2011-03-23 LAB — LIPID PANEL
HDL: 23 — ABNORMAL LOW
LDL Cholesterol: UNDETERMINED
Triglycerides: 473 — ABNORMAL HIGH

## 2011-03-23 LAB — URINALYSIS, ROUTINE W REFLEX MICROSCOPIC
Nitrite: NEGATIVE
Specific Gravity, Urine: 1.044 — ABNORMAL HIGH
pH: 5.5

## 2011-03-23 LAB — MAGNESIUM
Magnesium: 1.7
Magnesium: 1.9
Magnesium: 2.6 — ABNORMAL HIGH

## 2011-03-23 LAB — PROTIME-INR
INR: 1
Prothrombin Time: 16.8 — ABNORMAL HIGH

## 2011-03-23 LAB — HEPARIN LEVEL (UNFRACTIONATED): Heparin Unfractionated: 0.69

## 2011-03-23 LAB — HEMOGLOBIN AND HEMATOCRIT, BLOOD
HCT: 24.6 — ABNORMAL LOW
Hemoglobin: 8.4 — ABNORMAL LOW

## 2011-03-23 LAB — POCT I-STAT GLUCOSE: Glucose, Bld: 145 — ABNORMAL HIGH

## 2011-03-23 LAB — HEMOGLOBIN A1C: Mean Plasma Glucose: 172

## 2011-03-23 LAB — PLATELET COUNT: Platelets: 222

## 2011-10-09 ENCOUNTER — Telehealth: Payer: Self-pay | Admitting: Cardiovascular Disease

## 2011-10-09 NOTE — Telephone Encounter (Signed)
New Problem:     I called the patient and was unable to reach them. I left a message on their voicemail with my name, the reason I called, the name of his physician, and a number to call back to schedule their appointment. 

## 2011-11-22 ENCOUNTER — Encounter: Payer: Self-pay | Admitting: Cardiovascular Disease

## 2011-11-22 ENCOUNTER — Ambulatory Visit (INDEPENDENT_AMBULATORY_CARE_PROVIDER_SITE_OTHER): Payer: BC Managed Care – PPO | Admitting: Cardiovascular Disease

## 2011-11-22 VITALS — BP 135/77 | HR 77 | Ht 68.0 in | Wt 174.0 lb

## 2011-11-22 DIAGNOSIS — E119 Type 2 diabetes mellitus without complications: Secondary | ICD-10-CM

## 2011-11-22 DIAGNOSIS — I10 Essential (primary) hypertension: Secondary | ICD-10-CM

## 2011-11-22 DIAGNOSIS — E785 Hyperlipidemia, unspecified: Secondary | ICD-10-CM

## 2011-11-22 DIAGNOSIS — R0989 Other specified symptoms and signs involving the circulatory and respiratory systems: Secondary | ICD-10-CM

## 2011-11-22 DIAGNOSIS — I2510 Atherosclerotic heart disease of native coronary artery without angina pectoris: Secondary | ICD-10-CM

## 2011-11-22 NOTE — Assessment & Plan Note (Signed)
Well controlled.  Continue current medications and low sodium Dash type diet.    

## 2011-11-22 NOTE — Progress Notes (Signed)
Patient ID: Jason Bailey, male   DOB: 03-14-1951, 61 y.o.   MRN: [id number] Jason Bailey is seen today in followup for coronary artery disease. Previous CABG . He had a nonischemic Myoview 2009 with normal LV function. He is much better than last time I saw him He has a new endocrine doctor in [place] and is off actos. His thyroid nodules are stable. In 4/10 he had a normal carotid duplex despite right bruit. Active with no SSCP. Compliant with meds. Primary checking cholesterol A1c in 6 range. Has mild calf pain with statin but tolerable.  ROS: Denies fever, malais, weight loss, blurry vision, decreased visual acuity, cough, sputum, SOB, hemoptysis, pleuritic pain, palpitaitons, heartburn, abdominal pain, melena, lower extremity edema, claudication, or rash.  All other systems reviewed and negative  General: Affect appropriate Healthy:  appears stated age HEENT: normal Neck supple with no adenopathy JVP normal right ruits no thyromegaly Lungs clear with no wheezing and good diaphragmatic motion Heart:  S1/S2 no murmur, no rub, gallop or click PMI normal Abdomen: benighn, BS positve, no tenderness, no AAA no bruit.  No HSM or HJR Distal pulses intact with no bruits S/P left radial artery graft No edema Neuro non-focal Skin warm and dry No muscular weakness Brace on RLE  S./P right rotator cuff surgery   Current Outpatient Prescriptions  Medication Sig Dispense Refill  . aspirin 81 MG tablet Take 81 mg by mouth daily.        . Cholecalciferol (VITAMIN D) 1000 UNITS capsule Take 2,000 Units by mouth daily.       Marland Kitchen glipiZIDE (GLUCOTROL) 5 MG tablet 1 TAB AM AND A 2.5 MG TAB AT NIGHT...  TOTAL OF 7.5 MG DAILY      . lisinopril (PRINIVIL,ZESTRIL) 10 MG tablet Take 10 mg by mouth daily.      . metFORMIN (GLUMETZA) 500 MG (MOD) 24 hr tablet 1 TAB AM AND 2 TABS PM      . pravastatin (PRAVACHOL) 40 MG tablet Take 40 mg by mouth daily.      . saxagliptin HCl (ONGLYZA) 5 MG TABS tablet Take 5 mg by  mouth daily.        Marland Kitchen testosterone cypionate (DEPOTESTOTERONE CYPIONATE) 200 MG/ML injection Inject 50 mg into the muscle every 14 (fourteen) days.        . vitamin B-12 (CYANOCOBALAMIN) 500 MCG tablet Take 500 mcg by mouth daily.          Allergies  Review of patient's allergies indicates no known allergies.  Electrocardiogram: NSR rate 73  Nonspecific inferolateral T wave changes  Assessment and Plan

## 2011-11-22 NOTE — Patient Instructions (Signed)
Your physician wants you to follow-up in:  YEAR WITH DR Haywood Filler will receive a reminder letter in the mail two months in advance. If you don't receive a letter, please call our office to schedule the follow-up appointment. Your physician recommends that you continue on your current medications as directed. Please refer to the Current Medication list given to you today. Your physician has requested that you have a carotid duplex. This test is an ultrasound of the carotid arteries in your neck. It looks at blood flow through these arteries that supply the brain with blood. Allow one hour for this exam. There are no restrictions or special instructions. NEXT COUPLE OF WEEKS  DX BRUIT

## 2011-11-22 NOTE — Assessment & Plan Note (Signed)
Discussed low carb diet.  Target hemoglobin A1c is 6.5 or less.  Continue current medications.  

## 2011-11-22 NOTE — Assessment & Plan Note (Signed)
Stable with no angina and good activity level.  Continue medical Rx  

## 2011-11-22 NOTE — Assessment & Plan Note (Signed)
F/U duplex Has not been done in 3 years will order.  ASA

## 2011-11-22 NOTE — Assessment & Plan Note (Signed)
Cholesterol is at goal.  Continue current dose of statin and diet Rx.  No myalgias or side effects.  F/U  LFT's in 6 months. Lab Results  Component Value Date   LDLCALC  Value: UNABLE TO CALCULATE IF TRIGLYCERIDE OVER 400 mg/dL        Total Cholesterol/HDL:CHD Risk Coronary Heart Disease Risk Table                     Men   Women  1/2 Average Risk   3.4   3.3 02/11/2007   Labs being followed by primary.

## 2011-12-11 ENCOUNTER — Encounter (INDEPENDENT_AMBULATORY_CARE_PROVIDER_SITE_OTHER): Payer: BC Managed Care – PPO

## 2011-12-11 DIAGNOSIS — I6529 Occlusion and stenosis of unspecified carotid artery: Secondary | ICD-10-CM

## 2011-12-11 DIAGNOSIS — R0989 Other specified symptoms and signs involving the circulatory and respiratory systems: Secondary | ICD-10-CM

## 2013-01-12 ENCOUNTER — Ambulatory Visit (INDEPENDENT_AMBULATORY_CARE_PROVIDER_SITE_OTHER): Payer: BC Managed Care – PPO | Admitting: Cardiovascular Disease

## 2013-01-12 ENCOUNTER — Encounter: Payer: Self-pay | Admitting: Cardiovascular Disease

## 2013-01-12 VITALS — BP 132/72 | HR 77 | Ht 68.0 in | Wt 174.0 lb

## 2013-01-12 DIAGNOSIS — R0989 Other specified symptoms and signs involving the circulatory and respiratory systems: Secondary | ICD-10-CM

## 2013-01-12 DIAGNOSIS — I2510 Atherosclerotic heart disease of native coronary artery without angina pectoris: Secondary | ICD-10-CM

## 2013-01-12 MED ORDER — NITROGLYCERIN 0.4 MG SL SUBL: 0.4000 mg | SUBLINGUAL_TABLET | SUBLINGUAL | Status: AC | PRN

## 2013-01-12 NOTE — Assessment & Plan Note (Signed)
Discussed low carb diet.  Target hemoglobin A1c is 6.5 or less.  Continue current medications.  

## 2013-01-12 NOTE — Patient Instructions (Addendum)
Your physician has requested that you have a lexiscan myoview. For further information please visit [email or web address] Please follow instruction sheet, as given.  Your physician has requested that you have a carotid duplex. This test is an ultrasound of the carotid arteries in your neck. It looks at blood flow through these arteries that supply the brain with blood. Allow one hour for this exam. There are no restrictions or special instructions.  Your physician has recommended you make the following change in your medication: start using Nitroglycerin for chest pain. Please follow instructions given to you today and on RX bottle.

## 2013-01-12 NOTE — Assessment & Plan Note (Signed)
F/U carotid duplex no sig disease on previous duplex  ASA

## 2013-01-12 NOTE — Assessment & Plan Note (Signed)
Last 2 months with muscular like pain Seems related to zero turn mover and sounds muscular Nitro to be called in.  F/U lexiscan myovue ECG does show some T wave changes compared to a year ago

## 2013-01-12 NOTE — Addendum Note (Signed)
**Note De-Identified Jason Bailey Obfuscation** Addended by: Demetrios Loll on: 01/12/2013 10:57 AM   Modules accepted: Orders

## 2013-01-12 NOTE — Assessment & Plan Note (Signed)
Cholesterol is at goal.  Continue current dose of statin and diet Rx.  No myalgias or side effects.  F/U  LFT's in 6 months. Lab Results  Component Value Date   LDLCALC  Value: UNABLE TO CALCULATE IF TRIGLYCERIDE OVER 400 mg/dL        Total Cholesterol/HDL:CHD Risk Coronary Heart Disease Risk Table                     Men   Women  1/2 Average Risk   3.4   3.3 02/11/2007             

## 2013-01-12 NOTE — Assessment & Plan Note (Signed)
Well controlled.  Continue current medications and low sodium Dash type diet.    

## 2013-01-12 NOTE — Progress Notes (Signed)
Patient ID: Jason Bailey, male   DOB: February 01, 1951, 62 y.o.   MRN: [id number] Jason Bailey is seen today in followup for coronary artery disease. Previous CABG . He had a nonischemic Myoview 2009 with normal LV function. He is much better than last time I saw him He has a new endocrine doctor in [place] and is off actos. His thyroid nodules are stable. In 4/10 he had a normal carotid duplex despite right bruit. Active with no SSCP. Compliant with meds. Primary checking cholesterol A1c in 6 range. Has mild calf pain with statin but tolerable.  CABG 2008 [facility] PROCEDURE: Median sternotomy, extracorporeal circulation, coronary  artery bypass grafting x4 (left internal mammary artery to left anterior  descending artery, saphenous vein graft to first diagonal, saphenous  vein graft to obtuse marginal #1, saphenous vein graft to posterior  descending), endoscopic vein harvest, right leg.   ROS: Denies fever, malais, weight loss, blurry vision, decreased visual acuity, cough, sputum, SOB, hemoptysis, pleuritic pain, palpitaitons, heartburn, abdominal pain, melena, lower extremity edema, claudication, or rash.  All other systems reviewed and negative  General: Affect appropriate Healthy:  appears stated age HEENT: normal Neck supple with no adenopathy JVP normal right bruits no thyromegaly Lungs clear with no wheezing and good diaphragmatic motion Heart:  S1/S2 no murmur, no rub, gallop or click PMI normal Abdomen: benighn, BS positve, no tenderness, no AAA no bruit.  No HSM or HJR Distal pulses intact with no bruits No edema Neuro non-focal Skin warm and dry RLE weak with splint   Current Outpatient Prescriptions  Medication Sig Dispense Refill  . aspirin 81 MG tablet Take 81 mg by mouth daily.        Marland Kitchen glipiZIDE (GLUCOTROL) 5 MG tablet 1 TAB AM AND A 2.5 MG TAB AT NIGHT...  TOTAL OF 7.5 MG DAILY      . lisinopril (PRINIVIL,ZESTRIL) 10 MG tablet Take 10 mg by mouth daily.      .  metFORMIN (GLUMETZA) 500 MG (MOD) 24 hr tablet 1 TAB AM AND 2 TABS PM      . pravastatin (PRAVACHOL) 40 MG tablet Take 40 mg by mouth daily.      Marland Kitchen PRILOSEC OTC 20 MG tablet Take 1 tablet by mouth daily.      . saxagliptin HCl (ONGLYZA) 5 MG TABS tablet Take 5 mg by mouth daily.        Marland Kitchen testosterone cypionate (DEPOTESTOTERONE CYPIONATE) 200 MG/ML injection Inject 50 mg into the muscle every 14 (fourteen) days.        . traMADol (ULTRAM) 50 MG tablet Take 1 tablet by mouth as needed.      . vitamin B-12 (CYANOCOBALAMIN) 500 MCG tablet Take 500 mcg by mouth daily.         No current facility-administered medications for this visit.    Allergies  Review of patient's allergies indicates no known allergies.  Electrocardiogram:  SR rate 51 old IMI    Assessment and Plan

## 2013-01-20 ENCOUNTER — Ambulatory Visit (HOSPITAL_COMMUNITY): Payer: BC Managed Care – PPO | Attending: Cardiology | Admitting: Radiology

## 2013-01-20 ENCOUNTER — Encounter (INDEPENDENT_AMBULATORY_CARE_PROVIDER_SITE_OTHER): Payer: BC Managed Care – PPO

## 2013-01-20 VITALS — BP 148/72 | HR 58 | Ht 68.0 in | Wt 173.0 lb

## 2013-01-20 DIAGNOSIS — I6529 Occlusion and stenosis of unspecified carotid artery: Secondary | ICD-10-CM

## 2013-01-20 DIAGNOSIS — I2510 Atherosclerotic heart disease of native coronary artery without angina pectoris: Secondary | ICD-10-CM

## 2013-01-20 DIAGNOSIS — R079 Chest pain, unspecified: Secondary | ICD-10-CM

## 2013-01-20 DIAGNOSIS — R0989 Other specified symptoms and signs involving the circulatory and respiratory systems: Secondary | ICD-10-CM

## 2013-01-20 MED ORDER — TECHNETIUM TC 99M SESTAMIBI GENERIC - CARDIOLITE
33.0000 | Freq: Once | INTRAVENOUS | Status: AC | PRN
  Administered 2013-01-20: 33 via INTRAVENOUS

## 2013-01-20 MED ORDER — TECHNETIUM TC 99M SESTAMIBI GENERIC - CARDIOLITE
11.0000 | Freq: Once | INTRAVENOUS | Status: AC | PRN
  Administered 2013-01-20: 11 via INTRAVENOUS

## 2013-01-20 MED ORDER — REGADENOSON 0.4 MG/5ML IV SOLN
0.4000 mg | Freq: Once | INTRAVENOUS | Status: AC
  Administered 2013-01-20: 0.4 mg via INTRAVENOUS

## 2013-01-20 NOTE — Progress Notes (Signed)
[facility] SITE 3 NUCLEAR MED [address] [place], [place] [address] [phone]    Cardiology Nuclear Med Study  Jason Bailey is a 62 y.o. male     MRN : [id number]     DOB: [birth year]  Procedure Date: 01/20/2013  Nuclear Med Background Indication for Stress Test:  Evaluation for Ischemia and Graft Patency History:  '08 CABG; '09 Echo:EF=65%; '10 OZH:YQMVHQ, EF=56% Cardiac Risk Factors: History of Smoking, Hypertension, Lipids and NIDDM  Symptoms:  Chest Pain with and without Exertion (last episode of chest discomfort is now, 1/10) and Fatigue   Nuclear Pre-Procedure Caffeine/Decaff Intake:  7:00pm NPO After: 10:00pm   Lungs:  Clear. O2 Sat: 98% on room air. IV 0.9% NS with Angio Cath:  20g  IV Site: R Hand  IV Started by:  Cathlyn Parsons, RN  Chest Size (in):  40 Cup Size: n/a  Height: 5\' 8"  (1.727 m)  Weight:  173 lb (78.472 kg)  BMI:  Body mass index is 26.31 kg/(m^2). Tech Comments:  No med's taken today.    Nuclear Med Study 1 or 2 day study: 1 day  Stress Test Type:  Lexiscan  Reading MD: Cassell Clement, MD  Order Authorizing Provider:  Charlton Haws, MD  Resting Radionuclide: Technetium 50m Sestamibi  Resting Radionuclide Dose: 11.0 mCi   Stress Radionuclide:  Technetium 42m Sestamibi  Stress Radionuclide Dose: 33.0 mCi           Stress Protocol Rest HR: 58 Stress HR: 98  Rest BP: 148/72 Stress BP: 155/84  Exercise Time (min): 2:00 METS: n/a   Predicted Max HR: 160 bpm % Max HR: 60.62 bpm Rate Pressure Product: 46962   Dose of Adenosine (mg):  n/a Dose of Lexiscan: 0.4 mg  Dose of Atropine (mg): n/a Dose of Dobutamine: n/a mcg/kg/min (at max HR)  Stress Test Technologist: Smiley Houseman, CMA-N  Nuclear Technologist:  Domenic Polite, CNMT     Rest Procedure:  Myocardial perfusion imaging was performed at rest 45 minutes following the intravenous administration of Technetium 79m Sestamibi.  Rest ECG: NSR - Normal EKG  Stress  Procedure:  The patient received IV Lexiscan 0.4 mg over 15-seconds with concurrent low level exercise and then Technetium 18m Sestamibi was injected at 30-seconds while the patient continued walking one more minute.  He c/o being mildly lightheaded with the Lexiscan.  Quantitative spect images were obtained after a 45-minute delay.  Stress ECG: No significant change from baseline ECG  QPS Raw Data Images:  Normal; no motion artifact; normal heart/lung ratio. Stress Images:  Normal homogeneous uptake in all areas of the myocardium. Rest Images:  Normal homogeneous uptake in all areas of the myocardium. Subtraction (SDS):  No evidence of ischemia. Transient Ischemic Dilatation (Normal <1.22):  n/a Lung/Heart Ratio (Normal <0.45):  0.34  Quantitative Gated Spect Images QGS EDV:  133 ml QGS ESV:  80 ml  Impression Exercise Capacity:  Lexiscan with low level exercise. BP Response:  Normal blood pressure response. Clinical Symptoms:  No chest pain. ECG Impression:  No significant ST segment change suggestive of ischemia. Comparison with Prior Nuclear Study: No significant change from previous study except EF has dropped from 56% down to 40%.  Overall Impression:  Low risk stress nuclear study.  No evidence of ischemia or scar.  LV Ejection Fraction: 40%.  LV Wall Motion:  There are no segmental wall motion abnormalities.  The visual EF appears to be higher than 40%. Consider 2D echo to assess  LV function additionally.   Cassell Clement

## 2013-01-22 ENCOUNTER — Telehealth: Payer: Self-pay | Admitting: Cardiovascular Disease

## 2013-01-22 DIAGNOSIS — I25810 Atherosclerosis of coronary artery bypass graft(s) without angina pectoris: Secondary | ICD-10-CM

## 2013-01-22 DIAGNOSIS — R079 Chest pain, unspecified: Secondary | ICD-10-CM

## 2013-01-22 NOTE — Telephone Encounter (Signed)
Follow up ° ° ° °Pt is calling about his test results.  °

## 2013-01-22 NOTE — Telephone Encounter (Signed)
Spoke with patient to report nuclear stress results.  Patient informed that Dr. Eden Emms recommends f/u ECHO to assess EF.  Patient verbalized understanding and agreement with plan of care; patient aware that scheduling will call back with an appointment for ECHO; orders in EPIC.

## 2013-01-22 NOTE — Telephone Encounter (Signed)
Ok to go to [facility] and have echo latter

## 2013-01-22 NOTE — Telephone Encounter (Signed)
°

## 2013-01-22 NOTE — Telephone Encounter (Signed)
I left patient a message per patient request regarding Dr. Fabio Bering advice that [place] can go to beach and have ECHO after he returns.

## 2013-01-22 NOTE — Telephone Encounter (Signed)
Spoke with patient who would like to know if it is okay to go to the beach next week and have ECHO the week of 8/25?  I advised that I will ask Dr. Eden Emms and call patient back.

## 2013-02-06 ENCOUNTER — Ambulatory Visit (HOSPITAL_COMMUNITY): Payer: BC Managed Care – PPO | Attending: Cardiovascular Disease | Admitting: Radiology

## 2013-02-06 DIAGNOSIS — I059 Rheumatic mitral valve disease, unspecified: Secondary | ICD-10-CM | POA: Insufficient documentation

## 2013-02-06 DIAGNOSIS — R079 Chest pain, unspecified: Secondary | ICD-10-CM

## 2013-02-06 DIAGNOSIS — I2510 Atherosclerotic heart disease of native coronary artery without angina pectoris: Secondary | ICD-10-CM | POA: Insufficient documentation

## 2013-02-06 DIAGNOSIS — I25810 Atherosclerosis of coronary artery bypass graft(s) without angina pectoris: Secondary | ICD-10-CM

## 2013-02-06 DIAGNOSIS — I428 Other cardiomyopathies: Secondary | ICD-10-CM | POA: Insufficient documentation

## 2013-02-06 DIAGNOSIS — E785 Hyperlipidemia, unspecified: Secondary | ICD-10-CM | POA: Insufficient documentation

## 2013-02-06 DIAGNOSIS — R072 Precordial pain: Secondary | ICD-10-CM

## 2013-02-06 DIAGNOSIS — I10 Essential (primary) hypertension: Secondary | ICD-10-CM | POA: Insufficient documentation

## 2013-02-06 DIAGNOSIS — I379 Nonrheumatic pulmonary valve disorder, unspecified: Secondary | ICD-10-CM | POA: Insufficient documentation

## 2013-02-06 DIAGNOSIS — E119 Type 2 diabetes mellitus without complications: Secondary | ICD-10-CM | POA: Insufficient documentation

## 2013-02-06 NOTE — Progress Notes (Signed)
Echocardiogram performed.  

## 2013-02-11 NOTE — Telephone Encounter (Signed)
PT AWARE OF ECHO RESULTS./CY 

## 2013-02-11 NOTE — Telephone Encounter (Signed)
Follow up ° °Pt is returning your call regarding his test results.  °

## 2014-03-05 ENCOUNTER — Other Ambulatory Visit: Payer: Self-pay | Admitting: *Deleted

## 2014-03-05 DIAGNOSIS — R0989 Other specified symptoms and signs involving the circulatory and respiratory systems: Secondary | ICD-10-CM

## 2014-04-07 ENCOUNTER — Ambulatory Visit (HOSPITAL_COMMUNITY): Payer: BC Managed Care – PPO | Attending: Cardiovascular Disease | Admitting: Cardiology

## 2014-04-07 ENCOUNTER — Ambulatory Visit (INDEPENDENT_AMBULATORY_CARE_PROVIDER_SITE_OTHER): Payer: BC Managed Care – PPO | Admitting: Cardiovascular Disease

## 2014-04-07 ENCOUNTER — Encounter: Payer: Self-pay | Admitting: Cardiovascular Disease

## 2014-04-07 VITALS — BP 140/68 | HR 72 | Resp 12 | Ht 68.0 in | Wt 167.0 lb

## 2014-04-07 DIAGNOSIS — E119 Type 2 diabetes mellitus without complications: Secondary | ICD-10-CM

## 2014-04-07 DIAGNOSIS — I2510 Atherosclerotic heart disease of native coronary artery without angina pectoris: Secondary | ICD-10-CM | POA: Diagnosis present

## 2014-04-07 DIAGNOSIS — I10 Essential (primary) hypertension: Secondary | ICD-10-CM

## 2014-04-07 DIAGNOSIS — R0989 Other specified symptoms and signs involving the circulatory and respiratory systems: Secondary | ICD-10-CM

## 2014-04-07 DIAGNOSIS — E785 Hyperlipidemia, unspecified: Secondary | ICD-10-CM | POA: Insufficient documentation

## 2014-04-07 DIAGNOSIS — Z87891 Personal history of nicotine dependence: Secondary | ICD-10-CM | POA: Insufficient documentation

## 2014-04-07 DIAGNOSIS — Z951 Presence of aortocoronary bypass graft: Secondary | ICD-10-CM | POA: Insufficient documentation

## 2014-04-07 DIAGNOSIS — I6523 Occlusion and stenosis of bilateral carotid arteries: Secondary | ICD-10-CM

## 2014-04-07 NOTE — Assessment & Plan Note (Signed)
Stable with no angina and good activity level.  Continue medical Rx  

## 2014-04-07 NOTE — Patient Instructions (Signed)
Your physician wants you to follow-up in:  6 MONTHS WITH DR NISHAN  You will receive a reminder letter in the mail two months in advance. If you don't receive a letter, please call our office to schedule the follow-up appointment. Your physician recommends that you continue on your current medications as directed. Please refer to the Current Medication list given to you today. 

## 2014-04-07 NOTE — Progress Notes (Signed)
Patient ID: Jason Bailey, male   DOB: December 28, 1950, 63 y.o.   MRN: [id number] Jason Bailey is seen today in followup for coronary artery disease. Previous CABG . He had a nonischemic Myoview 2009 with normal LV function. He is much better than last time I saw him He has a new endocrine doctor in [place] and is off actos. His thyroid nodules are stable. In 4/10 he had a normal carotid duplex despite right bruit. Active with no SSCP. Compliant with meds. Primary checking cholesterol A1c in 6 range. Has mild calf pain with statin but tolerable.   CABG 2008 [facility]  PROCEDURE: Median sternotomy, extracorporeal circulation, coronary  artery bypass grafting x4 (left internal mammary artery to left anterior  descending artery, saphenous vein graft to first diagonal, saphenous  vein graft to obtuse marginal #1, saphenous vein graft to posterior  descending), endoscopic vein harvest, right leg.  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  F/U duplex in one year  Needs surgery for right foot drop in [facility]  From previous accident    ROS: Denies fever, malais, weight loss, blurry vision, decreased visual acuity, cough, sputum, SOB, hemoptysis, pleuritic pain, palpitaitons, heartburn, abdominal pain, melena, lower extremity edema, claudication, or rash.  All other systems reviewed and negative  General: Affect appropriate Healthy:  appears stated age 7: normal Neck supple with no adenopathy JVP normal bilateral  bruits no thyromegaly Lungs clear with no wheezing and good diaphragmatic motion Heart:  S1/S2 SEM murmur, no rub, gallop or click PMI normal Abdomen: benighn, BS positve, no tenderness, no AAA no bruit.  No HSM or HJR Distal pulses intact with no bruits No edema Neuro non-focal  Right foot drop  Skin warm and dry No muscular weakness   Current Outpatient Prescriptions  Medication Sig Dispense Refill  . aspirin 81 MG tablet Take 81 mg by mouth daily.         Marland Kitchen glipiZIDE (GLUCOTROL) 5 MG tablet 1 TAB AM AND A 2.5 MG TAB AT NIGHT...  TOTAL OF 7.5 MG DAILY      . lisinopril (PRINIVIL,ZESTRIL) 10 MG tablet Take 10 mg by mouth daily.      . metFORMIN (GLUMETZA) 500 MG (MOD) 24 hr tablet 1 TAB AM AND 2 TABS PM      . nitroGLYCERIN (NITROSTAT) 0.4 MG SL tablet Place 1 tablet (0.4 mg total) under the tongue every 5 (five) minutes as needed for chest pain.  25 tablet  3  . pravastatin (PRAVACHOL) 40 MG tablet Take 40 mg by mouth daily.      Marland Kitchen PRILOSEC OTC 20 MG tablet Take 1 tablet by mouth daily.      . saxagliptin HCl (ONGLYZA) 5 MG TABS tablet Take 5 mg by mouth daily.        Marland Kitchen testosterone cypionate (DEPOTESTOTERONE CYPIONATE) 200 MG/ML injection Inject 50 mg into the muscle every 14 (fourteen) days.        . traMADol (ULTRAM) 50 MG tablet Take 1 tablet by mouth as needed.      . vitamin B-12 (CYANOCOBALAMIN) 500 MCG tablet Take 500 mcg by mouth daily.         No current facility-administered medications for this visit.    Allergies  Review of patient's allergies indicates no known allergies.  Electrocardiogram:  SR rate 77 LVH  Old IMI   Assessment and Plan

## 2014-04-07 NOTE — Assessment & Plan Note (Signed)
Well controlled.  Continue current medications and low sodium Dash type diet.    

## 2014-04-07 NOTE — Assessment & Plan Note (Signed)
Cholesterol is at goal.  Continue current dose of statin and diet Rx.  No myalgias or side effects.  F/U  LFT's in 6 months. Lab Results  Component Value Date   LDLCALC  Value: UNABLE TO CALCULATE IF TRIGLYCERIDE OVER 400 mg/dL        Total Cholesterol/HDL:CHD Risk Coronary Heart Disease Risk Table                     Men   Women  1/2 Average Risk   3.4   3.3 02/11/2007

## 2014-04-07 NOTE — Assessment & Plan Note (Signed)
Discussed low carb diet.  Target hemoglobin A1c is 6.5 or less.  Continue current medications. A1c down to 7.2 from 9 slowly improving with change in meds as well Seen by eye doctor yearly

## 2014-04-07 NOTE — Progress Notes (Signed)
Carotid duplex performed 

## 2014-04-07 NOTE — Assessment & Plan Note (Signed)
123456 LICA stenosis.  F/U duplex in one year

## 2015-04-27 ENCOUNTER — Other Ambulatory Visit: Payer: Self-pay | Admitting: Cardiovascular Disease

## 2015-04-27 DIAGNOSIS — I6523 Occlusion and stenosis of bilateral carotid arteries: Secondary | ICD-10-CM

## 2015-05-02 ENCOUNTER — Inpatient Hospital Stay (HOSPITAL_COMMUNITY): Admission: RE | Admit: 2015-05-02 | Payer: BC Managed Care – PPO | Source: Ambulatory Visit

## 2015-06-23 ENCOUNTER — Encounter: Payer: Self-pay | Admitting: Cardiovascular Disease

## 2015-06-23 NOTE — Progress Notes (Signed)
Patient ID: Jason Bailey, male   DOB: 06-Mar-1951, 65 y.o.   MRN: [id number] Tavon is seen today in followup for coronary artery disease. Previous CABG . He had a nonischemic Myoview 2009 with normal LV function. He is much better than last time I saw him He has a new endocrine doctor in [place] and is off actos. His thyroid nodules are stable. In 4/10 he had a normal carotid duplex despite right bruit. Active with no SSCP. Compliant with meds. Primary checking cholesterol A1c in 6 range. Has mild calf pain with statin but tolerable.   CABG 2008 [facility]  PROCEDURE: Median sternotomy, extracorporeal circulation, coronary  artery bypass grafting x4 (left internal mammary artery to left anterior  descending artery, saphenous vein graft to first diagonal, saphenous  vein graft to obtuse marginal #1, saphenous vein graft to posterior  descending), endoscopic vein harvest, right leg.  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  F/U duplex overdue  Had Surgery for right foot drop in [facility]  From previous accident    ROS: Denies fever, malais, weight loss, blurry vision, decreased visual acuity, cough, sputum, SOB, hemoptysis, pleuritic pain, palpitaitons, heartburn, abdominal pain, melena, lower extremity edema, claudication, or rash.  All other systems reviewed and negative  General: Affect appropriate Healthy:  appears stated age 47: normal Neck supple with no adenopathy JVP normal bilateral  bruits no thyromegaly Lungs clear with no wheezing and good diaphragmatic motion Heart:  S1/S2 SEM murmur, no rub, gallop or click PMI normal Abdomen: benighn, BS positve, no tenderness, no AAA no bruit.  No HSM or HJR Distal pulses intact with no bruits No edema Neuro non-focal  Right foot drop  Skin warm and dry No muscular weakness   Current Outpatient Prescriptions  Medication Sig Dispense Refill  . aspirin 81 MG tablet Take 81 mg by mouth daily.      .  dapagliflozin propanediol (FARXIGA) 5 MG TABS tablet Take 2.5 mg by mouth daily.    Marland Kitchen lisinopril (PRINIVIL,ZESTRIL) 10 MG tablet Take 10 mg by mouth daily.    . metFORMIN (GLUMETZA) 500 MG (MOD) 24 hr tablet 1 TAB BY MOUTH IN THE AM AND 2 TABS BY MOUTH IN THE PM    . nitroGLYCERIN (NITROSTAT) 0.4 MG SL tablet Place 1 tablet (0.4 mg total) under the tongue every 5 (five) minutes as needed for chest pain. 25 tablet 3  . pravastatin (PRAVACHOL) 40 MG tablet Take 40 mg by mouth daily.    Marland Kitchen PRILOSEC OTC 20 MG tablet Take 1 tablet by mouth daily.    Marland Kitchen testosterone cypionate (DEPOTESTOTERONE CYPIONATE) 200 MG/ML injection Inject 50 mg into the muscle every 14 (fourteen) days.      . traMADol (ULTRAM) 50 MG tablet Take 1 tablet by mouth as directed.     . vitamin B-12 (CYANOCOBALAMIN) 500 MCG tablet Take 500 mcg by mouth daily.       No current facility-administered medications for this visit.    Allergies  Review of patient's allergies indicates no known allergies.  Electrocardiogram:  SR rate 77 LVH  Old IMI  06/24/15  SR rate 63 LVH Q lead 3   Assessment and Plan CAD/CABG:  2008  No angina continue medical Rx Chol: labs with primary  Lab Results  Component Value Date   LDLCALC  02/11/2007    UNABLE TO CALCULATE IF TRIGLYCERIDE OVER 400 mg/dL        Total Cholesterol/HDL:CHD Risk Coronary Heart Disease Risk Table  Men   Women  1/2 Average Risk   3.4   3.3  Bruit:  Known 123456 LICA f/u duplex ordered     Jenkins Rouge

## 2015-06-24 ENCOUNTER — Encounter: Payer: Self-pay | Admitting: Cardiovascular Disease

## 2015-06-24 ENCOUNTER — Ambulatory Visit (INDEPENDENT_AMBULATORY_CARE_PROVIDER_SITE_OTHER): Payer: BC Managed Care – PPO | Admitting: Cardiovascular Disease

## 2015-06-24 VITALS — BP 120/60 | HR 68 | Ht 68.0 in | Wt 156.1 lb

## 2015-06-24 DIAGNOSIS — I10 Essential (primary) hypertension: Secondary | ICD-10-CM | POA: Diagnosis not present

## 2015-06-24 DIAGNOSIS — R0989 Other specified symptoms and signs involving the circulatory and respiratory systems: Secondary | ICD-10-CM

## 2015-06-24 DIAGNOSIS — I6523 Occlusion and stenosis of bilateral carotid arteries: Secondary | ICD-10-CM | POA: Diagnosis not present

## 2015-06-24 NOTE — Patient Instructions (Addendum)
Medication Instructions:  Your physician recommends that you continue on your current medications as directed. Please refer to the Current Medication list given to you today.  Labwork: NONE  Testing/Procedures: Your physician has requested that you have a carotid duplex. This test is an ultrasound of the carotid arteries in your neck. It looks at blood flow through these arteries that supply the brain with blood. Allow one hour for this exam. There are no restrictions or special instructions.  Follow-Up: Your physician wants you to follow-up in: 12 months with Dr. Nishan. You will receive a reminder letter in the mail two months in advance. If you don't receive a letter, please call our office to schedule the follow-up appointment.   If you need a refill on your cardiac medications before your next appointment, please call your pharmacy.    

## 2015-07-04 ENCOUNTER — Ambulatory Visit (HOSPITAL_COMMUNITY)
Admission: RE | Admit: 2015-07-04 | Discharge: 2015-07-04 | Disposition: A | Discharge status: Home / Self Care | Payer: BC Managed Care – PPO | Source: Ambulatory Visit | Attending: Cardiovascular Disease | Admitting: Cardiovascular Disease

## 2015-07-04 DIAGNOSIS — Z951 Presence of aortocoronary bypass graft: Secondary | ICD-10-CM | POA: Diagnosis not present

## 2015-07-04 DIAGNOSIS — E785 Hyperlipidemia, unspecified: Secondary | ICD-10-CM | POA: Insufficient documentation

## 2015-07-04 DIAGNOSIS — Z87891 Personal history of nicotine dependence: Secondary | ICD-10-CM | POA: Diagnosis not present

## 2015-07-04 DIAGNOSIS — I6523 Occlusion and stenosis of bilateral carotid arteries: Secondary | ICD-10-CM

## 2015-07-04 DIAGNOSIS — I10 Essential (primary) hypertension: Secondary | ICD-10-CM

## 2015-07-04 DIAGNOSIS — R0989 Other specified symptoms and signs involving the circulatory and respiratory systems: Secondary | ICD-10-CM

## 2015-07-04 DIAGNOSIS — I2510 Atherosclerotic heart disease of native coronary artery without angina pectoris: Secondary | ICD-10-CM | POA: Insufficient documentation

## 2015-07-04 DIAGNOSIS — E119 Type 2 diabetes mellitus without complications: Secondary | ICD-10-CM | POA: Insufficient documentation

## 2015-07-18 ENCOUNTER — Telehealth: Payer: Self-pay

## 2015-07-18 NOTE — Telephone Encounter (Signed)
Carotids ok some blockage in right innominate F/u duplex in a year   Left message for patient to call back about Carotid results.

## 2015-07-18 NOTE — Telephone Encounter (Signed)
Patient called back about carotid results. Informed patient that carotids okay some blockage in right innominate, per Dr. Johnsie Cancel. Will put recall in for patient to have echo in one year.

## 2016-06-22 NOTE — Progress Notes (Signed)
Patient ID: Jason Bailey, male   DOB: 04/25/1951, 66 y.o.   MRN: [id number] Jason Bailey is seen today in followup for coronary artery disease. Previous CABG . He had a nonischemic Myoview 2009 with normal LV function. He is much better than last time I saw him He has a new endocrine doctor in [place] and is off actos. His thyroid nodules are stable. In 4/10 he had a normal carotid duplex despite right bruit. Active with no SSCP. Compliant with meds. Primary checking cholesterol A1c in 6 range. Has mild calf pain with statin but tolerable.   CABG 2008 [facility]  PROCEDURE: Median sternotomy, extracorporeal circulation, coronary  artery bypass grafting x4 (left internal mammary artery to left anterior  descending artery, saphenous vein graft to first diagonal, saphenous  vein graft to obtuse marginal #1, saphenous vein graft to posterior  descending), endoscopic vein harvest, right leg.  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  F/U duplex overdue Duplex with new criteria 07/04/15 suggested lower blockage in LICA  Had Surgery for right foot drop in [facility]  From previous accident   Only taking 1/2 his pravachol due to myalgias  ROS: Denies fever, malais, weight loss, blurry vision, decreased visual acuity, cough, sputum, SOB, hemoptysis, pleuritic pain, palpitaitons, heartburn, abdominal pain, melena, lower extremity edema, claudication, or rash.  All other systems reviewed and negative  General: Affect appropriate Healthy:  appears stated age 66: normal Neck supple with no adenopathy JVP normal bilateral  bruits no thyromegaly Lungs clear with no wheezing and good diaphragmatic motion Heart:  S1/S2 SEM murmur, no rub, gallop or click PMI normal Abdomen: benighn, BS positve, no tenderness, no AAA no bruit.  No HSM or HJR Distal pulses intact with no bruits No edema Neuro non-focal  Right foot drop  Skin warm and dry No muscular weakness   Current  Outpatient Prescriptions  Medication Sig Dispense Refill  . aspirin 81 MG tablet Take 81 mg by mouth daily.      . citalopram (CELEXA) 10 MG tablet Take 5 mg by mouth daily.    . dapagliflozin propanediol (FARXIGA) 5 MG TABS tablet Take 2.5 mg by mouth daily.    . fluticasone (FLONASE) 50 MCG/ACT nasal spray Place 2 sprays into both nostrils daily as needed for allergies.    Marland Kitchen lisinopril (PRINIVIL,ZESTRIL) 10 MG tablet Take 10 mg by mouth daily.    . metFORMIN (GLUMETZA) 500 MG (MOD) 24 hr tablet 1 TAB BY MOUTH IN THE AM AND 2 TABS BY MOUTH IN THE PM    . nitroGLYCERIN (NITROSTAT) 0.4 MG SL tablet Place 1 tablet (0.4 mg total) under the tongue every 5 (five) minutes as needed for chest pain. 25 tablet 3  . pravastatin (PRAVACHOL) 40 MG tablet Take 40 mg by mouth daily.    Marland Kitchen PRILOSEC OTC 20 MG tablet Take 1 tablet by mouth daily.    Marland Kitchen testosterone cypionate (DEPOTESTOTERONE CYPIONATE) 200 MG/ML injection Inject 50 mg into the muscle every 14 (fourteen) days.      . traMADol (ULTRAM) 50 MG tablet Take 1 tablet by mouth as directed.     . vitamin B-12 (CYANOCOBALAMIN) 500 MCG tablet Take 500 mcg by mouth daily.       No current facility-administered medications for this visit.     Allergies  Patient has no known allergies.  Electrocardiogram:  SR rate 77 LVH  Old IMI  06/24/15  SR rate 63 LVH Q lead 3  06/27/15 SR rate  79 old IMI T inversions 3,F and V6   Assessment and Plan CAD/CABG:  2008  No angina continue medical Rx Chol: labs with primary on statin he indicates being at goal if not and myalgias prevent Titration of statin will refer to lipid clinic for PSK9 Bruit:  Known 123456 LICA Left subclavian velocity 3 m/sec Duplex next visit 06/2017    Jason Bailey

## 2016-06-26 ENCOUNTER — Encounter: Payer: Self-pay | Admitting: Cardiovascular Disease

## 2016-06-26 ENCOUNTER — Ambulatory Visit (INDEPENDENT_AMBULATORY_CARE_PROVIDER_SITE_OTHER): Payer: BC Managed Care – PPO | Admitting: Cardiovascular Disease

## 2016-06-26 VITALS — BP 110/50 | HR 69 | Ht 67.0 in | Wt 163.8 lb

## 2016-06-26 DIAGNOSIS — I10 Essential (primary) hypertension: Secondary | ICD-10-CM | POA: Diagnosis not present

## 2016-06-26 DIAGNOSIS — I6523 Occlusion and stenosis of bilateral carotid arteries: Secondary | ICD-10-CM | POA: Diagnosis not present

## 2016-06-26 NOTE — Patient Instructions (Addendum)
Medication Instructions:  Your physician recommends that you continue on your current medications as directed. Please refer to the Current Medication list given to you today.  Labwork: NONE  Testing/Procedures: Your physician has requested that you have a carotid duplex in one year. This test is an ultrasound of the carotid arteries in your neck. It looks at blood flow through these arteries that supply the brain with blood. Allow one hour for this exam. There are no restrictions or special instructions.  Follow-Up: Your physician wants you to follow-up in: 12 months with Dr. Johnsie Cancel. You will receive a reminder letter in the mail two months in advance. If you don't receive a letter, please call our office to schedule the follow-up appointment.   If you need a refill on your cardiac medications before your next appointment, please call your pharmacy.

## 2017-06-13 ENCOUNTER — Emergency Department (HOSPITAL_BASED_OUTPATIENT_CLINIC_OR_DEPARTMENT_OTHER): Payer: BC Managed Care – PPO

## 2017-06-13 ENCOUNTER — Encounter (HOSPITAL_BASED_OUTPATIENT_CLINIC_OR_DEPARTMENT_OTHER): Payer: Self-pay

## 2017-06-13 ENCOUNTER — Other Ambulatory Visit: Payer: Self-pay

## 2017-06-13 ENCOUNTER — Observation Stay (HOSPITAL_BASED_OUTPATIENT_CLINIC_OR_DEPARTMENT_OTHER)
Admission: EM | Admit: 2017-06-13 | Discharge: 2017-06-14 | Disposition: A | Discharge status: Home / Self Care | Payer: BC Managed Care – PPO | Attending: Internal Medicine | Admitting: Internal Medicine

## 2017-06-13 DIAGNOSIS — Z951 Presence of aortocoronary bypass graft: Secondary | ICD-10-CM | POA: Diagnosis not present

## 2017-06-13 DIAGNOSIS — I10 Essential (primary) hypertension: Secondary | ICD-10-CM | POA: Diagnosis present

## 2017-06-13 DIAGNOSIS — Z7982 Long term (current) use of aspirin: Secondary | ICD-10-CM | POA: Diagnosis not present

## 2017-06-13 DIAGNOSIS — G459 Transient cerebral ischemic attack, unspecified: Secondary | ICD-10-CM

## 2017-06-13 DIAGNOSIS — R200 Anesthesia of skin: Secondary | ICD-10-CM | POA: Diagnosis present

## 2017-06-13 DIAGNOSIS — Z87891 Personal history of nicotine dependence: Secondary | ICD-10-CM | POA: Insufficient documentation

## 2017-06-13 DIAGNOSIS — I2510 Atherosclerotic heart disease of native coronary artery without angina pectoris: Secondary | ICD-10-CM | POA: Insufficient documentation

## 2017-06-13 DIAGNOSIS — E785 Hyperlipidemia, unspecified: Secondary | ICD-10-CM | POA: Diagnosis not present

## 2017-06-13 DIAGNOSIS — R0989 Other specified symptoms and signs involving the circulatory and respiratory systems: Secondary | ICD-10-CM | POA: Diagnosis not present

## 2017-06-13 DIAGNOSIS — Z7984 Long term (current) use of oral hypoglycemic drugs: Secondary | ICD-10-CM | POA: Insufficient documentation

## 2017-06-13 DIAGNOSIS — Z79899 Other long term (current) drug therapy: Secondary | ICD-10-CM | POA: Diagnosis not present

## 2017-06-13 DIAGNOSIS — I639 Cerebral infarction, unspecified: Principal | ICD-10-CM | POA: Insufficient documentation

## 2017-06-13 DIAGNOSIS — E119 Type 2 diabetes mellitus without complications: Secondary | ICD-10-CM | POA: Diagnosis not present

## 2017-06-13 LAB — BASIC METABOLIC PANEL
ANION GAP: 9 (ref 5–15)
BUN: 11 mg/dL (ref 6–20)
CALCIUM: 9.2 mg/dL (ref 8.9–10.3)
CO2: 26 mmol/L (ref 22–32)
Chloride: 101 mmol/L (ref 101–111)
Creatinine, Ser: 0.58 mg/dL — ABNORMAL LOW (ref 0.61–1.24)
GFR calc Af Amer: >60 mL/min (ref >60)
Glucose, Bld: 215 mg/dL — ABNORMAL HIGH (ref 65–99)
Potassium: 3.9 mmol/L (ref 3.5–5.1)
Sodium: 136 mmol/L (ref 135–145)

## 2017-06-13 LAB — CBC WITH DIFFERENTIAL/PLATELET
BASOS PCT: 0 %
Basophils Absolute: 0 10*3/uL (ref 0.0–0.1)
Eosinophils Absolute: 0.1 10*3/uL (ref 0.0–0.7)
Eosinophils Relative: 2 %
HEMATOCRIT: 45.6 % (ref 39.0–52.0)
Hemoglobin: 15.8 g/dL (ref 13.0–17.0)
Lymphocytes Relative: 27 %
Lymphs Abs: 2.2 10*3/uL (ref 0.7–4.0)
MCH: 32.1 pg (ref 26.0–34.0)
MCHC: 34.6 g/dL (ref 30.0–36.0)
MCV: 92.7 fL (ref 78.0–100.0)
MONO ABS: 0.7 10*3/uL (ref 0.1–1.0)
Monocytes Relative: 8 %
NEUTROS ABS: 5 10*3/uL (ref 1.7–7.7)
NEUTROS PCT: 63 %
PLATELETS: 232 10*3/uL (ref 150–400)
RBC: 4.92 MIL/uL (ref 4.22–5.81)
RDW: 13 % (ref 11.5–15.5)
WBC: 8 10*3/uL (ref 4.0–10.5)

## 2017-06-13 MED ORDER — INSULIN ASPART 100 UNIT/ML ~~LOC~~ SOLN
0.0000 [IU] | Freq: Three times a day (TID) | SUBCUTANEOUS | Status: DC
  Administered 2017-06-14: 2 [IU] via SUBCUTANEOUS
  Administered 2017-06-14: 3 [IU] via SUBCUTANEOUS

## 2017-06-13 MED ORDER — STROKE: EARLY STAGES OF RECOVERY BOOK
Freq: Once | Status: AC
  Administered 2017-06-13: 1

## 2017-06-13 MED ORDER — PRAVASTATIN SODIUM 40 MG PO TABS
40.0000 mg | ORAL_TABLET | Freq: Every day | ORAL | Status: DC
Start: 2017-06-14 — End: 2017-06-14
  Administered 2017-06-14: 40 mg via ORAL
  Filled 2017-06-13: qty 1

## 2017-06-13 MED ORDER — ASPIRIN 325 MG PO TABS
325.0000 mg | ORAL_TABLET | Freq: Every day | ORAL | Status: DC
  Administered 2017-06-14: 325 mg via ORAL
  Filled 2017-06-13: qty 1

## 2017-06-13 MED ORDER — ACETAMINOPHEN 160 MG/5ML PO SOLN: 650.0000 mg | ORAL | Status: DC | PRN

## 2017-06-13 MED ORDER — ASPIRIN 300 MG RE SUPP: 300.0000 mg | Freq: Every day | RECTAL | Status: DC

## 2017-06-13 MED ORDER — LISINOPRIL 10 MG PO TABS
10.0000 mg | ORAL_TABLET | Freq: Every day | ORAL | Status: DC
  Administered 2017-06-14: 10 mg via ORAL
  Filled 2017-06-13: qty 1

## 2017-06-13 MED ORDER — ASPIRIN 81 MG PO CHEW
324.0000 mg | CHEWABLE_TABLET | Freq: Once | ORAL | Status: AC
  Administered 2017-06-13: 324 mg via ORAL
  Filled 2017-06-13: qty 4

## 2017-06-13 MED ORDER — ACETAMINOPHEN 650 MG RE SUPP: 650.0000 mg | RECTAL | Status: DC | PRN

## 2017-06-13 MED ORDER — ACETAMINOPHEN 325 MG PO TABS: 650.0000 mg | ORAL_TABLET | ORAL | Status: DC | PRN

## 2017-06-13 NOTE — ED Notes (Signed)
After patient passed stroke swallow screen. Pt was given crackers, pnut butter, and water

## 2017-06-13 NOTE — ED Triage Notes (Signed)
Pt has no difficulty speaking, no facial drooping, or weakness in triage.

## 2017-06-13 NOTE — ED Triage Notes (Signed)
Pt c/o dizziness x2 weeks. Pt reports last night he started having facial numbness on the L side from ear the jaw. Pt also endorses ear ache on L.

## 2017-06-13 NOTE — ED Provider Notes (Signed)
[facility] Provider Note   CSN: [id number] Arrival date & time: 06/13/17  1510     History   Chief Complaint Chief Complaint  Patient presents with  . Numbness    Face    HPI Jason Bailey is a 67 y.o. male. Chief complaint is left hand clumsiness and weakness last weekend, and facial numbness today.  HPI:  67 year old male. History of coronary bypass, diabetes mellitus-non-insulin-dependent, hypertension, hypercholesterolemia. No prior CVA or TIA.  Last weekend on Saturday and Sunday states that he felt weak and clumsy with his left hand. This is improved or resolved. He fell sleep last night before midnight feeling normal. He awakened at 2 AM and felt like his left face was numb "like you've been to the dentist". He felt like his left eye would not open completely. His vision was however normal.  He awakened naproxen 7 AM. He's been up and around. No abnormalities of strength or coordination with his arm or leg today. No difficulty with gait vision or swallowing. However he still feels numbness on the left side of his face and head.  No palpitations. No history of A. Fib. He is in sinus rhythm today.  On exam he has an notable right carotid bruit, and very subtle left carotid bruit. He states Dr. Johnsie Cancel in his cardiologist has told him about this in the past  Past Medical History:  Diagnosis Date  . Chest pain   . Diabetes mellitus   . Hyperlipidemia   . Hypertension   . Malaise and fatigue   . Osteoarthritis   . Right foot drop   . Right shoulder pain     Patient Active Problem List   Diagnosis Date Noted  . Coronary atherosclerosis 09/29/2008  . Bilateral carotid bruits 09/29/2008  . Diabetes mellitus type 2, uncomplicated ([place]) 78/46/9629  . HYPERLIPIDEMIA 09/28/2008  . Essential hypertension 09/28/2008  . OSTEOARTHRITIS 09/28/2008  . MALAISE AND FATIGUE 09/28/2008  . CHEST PAIN, ATYPICAL 09/28/2008    Past Surgical History:    Procedure Laterality Date  . 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 KNEE SURGERY         Home Medications    Prior to Admission medications   Medication Sig Start Date End Date Taking? Authorizing Provider  cholecalciferol (VITAMIN D) 1000 units tablet Take 1,000 Units by mouth daily.   Yes [provider]  aspirin 81 MG tablet Take 81 mg by mouth daily.      [provider]  citalopram (CELEXA) 10 MG tablet Take 5 mg by mouth daily. 05/11/16   [provider]  dapagliflozin propanediol (FARXIGA) 5 MG TABS tablet Take 2.5 mg by mouth daily.    [provider]  fluticasone (FLONASE) 50 MCG/ACT nasal spray Place 2 sprays into both nostrils daily as needed for allergies. 05/25/15   [provider]  lisinopril (PRINIVIL,ZESTRIL) 10 MG tablet Take 10 mg by mouth daily.    [provider]  metFORMIN (GLUMETZA) 500 MG (MOD) 24 hr tablet 1 TAB BY MOUTH IN THE AM AND 2 TABS BY MOUTH IN THE PM    [provider]  nitroGLYCERIN (NITROSTAT) 0.4 MG SL tablet Place 1 tablet (0.4 mg total) under the tongue every 5 (five) minutes as needed for chest pain. 01/12/13   Josue Hector, MD  pravastatin (PRAVACHOL) 40 MG tablet Take 40 mg by mouth daily.    [provider]  PRILOSEC OTC 20 MG tablet Take 1 tablet by mouth daily. 12/30/12   [provider]  testosterone cypionate (DEPOTESTOTERONE CYPIONATE) 200 MG/ML injection Inject 50 mg into the muscle every 14 (fourteen) days.      [provider]  traMADol (ULTRAM) 50 MG tablet Take 1 tablet by mouth as directed.  12/29/12   [provider]  vitamin B-12 (CYANOCOBALAMIN) 500 MCG tablet Take 500 mcg by mouth daily.      [provider]    Family History No family history on file.  Social History Social History   Tobacco Use  . Smoking status: Former Research scientist (life sciences)  . Smokeless tobacco: Never Used  . Tobacco comment: quit 4 yrs ago   Substance Use Topics  . Alcohol use: No  . Drug use: No     Allergies   Patient has no known allergies.   Review of Systems Review of Systems  Constitutional: Negative for appetite change, chills, diaphoresis, fatigue and fever.  HENT: Negative for mouth sores, sore throat and trouble swallowing.   Eyes: Negative for visual disturbance.  Respiratory: Negative for cough, chest tightness, shortness of breath and wheezing.   Cardiovascular: Negative for chest pain.  Gastrointestinal: Negative for abdominal distention, abdominal pain, diarrhea, nausea and vomiting.  Endocrine: Negative for polydipsia, polyphagia and polyuria.  Genitourinary: Negative for dysuria, frequency and hematuria.  Musculoskeletal: Negative for gait problem.  Skin: Negative for color change, pallor and rash.  Neurological: Positive for weakness and numbness. Negative for dizziness, syncope, light-headedness and headaches.  Hematological: Does not bruise/bleed easily.  Psychiatric/Behavioral: Negative for behavioral problems and confusion.     Physical Exam Updated Vital Signs BP 114/69   Pulse 63   Temp 98.1 F (36.7 C) (Oral)   Resp 16   Ht 5\' 7"  (1.702 m)   Wt 71.7 kg (158 lb)   SpO2 95%   BMI 24.75 kg/m   Physical Exam  Constitutional: He is oriented to person, place, and time. He appears well-developed and well-nourished. No distress.  HENT:  Head: Normocephalic.  Eyes: Conjunctivae are normal. Pupils are equal, round, and reactive to light. No scleral icterus.  Neck: Normal range of motion. Neck supple. No thyromegaly present.  Cardiovascular: Normal rate and regular rhythm. Exam reveals no gallop and no friction rub.  No murmur heard. Regular. Sinus rhythm on the monitor.  Pulmonary/Chest: Effort normal and breath sounds normal. No respiratory distress. He has no wheezes. He has no rales.  Abdominal: Soft. Bowel sounds are normal. He exhibits no distension. There is no tenderness. There  is no rebound.  Musculoskeletal: Normal range of motion.  Neurological: He is alert and oriented to person, place, and time.  Numbness to the left upper face. No facial weakness. No peripheral Bell's. Normal ear. No rash or lesions. Cranial nerves otherwise intact. Normal strength and sensation 4 extremities. Normal gait and cerebellar function. NIH 1 for decreased facial sensation  Skin: Skin is warm and dry. No rash noted.  Psychiatric: He has a normal mood and affect. His behavior is normal.     ED Treatments / Results  Labs (all labs ordered are listed, but only abnormal results are displayed) Labs Reviewed  BASIC METABOLIC PANEL - Abnormal; Notable for the following components:      Result Value   Glucose, Bld 215 (*)    Creatinine, Ser 0.58 (*)    All other components within normal limits  CBC WITH DIFFERENTIAL/PLATELET    EKG  EKG Interpretation None  Radiology Ct Head Wo Contrast  Result Date: 06/13/2017 CLINICAL DATA:  Dizziness for 2 weeks EXAM: CT HEAD WITHOUT CONTRAST TECHNIQUE: Contiguous axial images were obtained from the base of the skull through the vertex without intravenous contrast. COMPARISON:  07/14/2013 FINDINGS: Brain: No evidence of acute infarction, hemorrhage, hydrocephalus, extra-axial collection or mass lesion/mass effect. A small lacunar infarct is noted along the inferior aspect of the basal ganglia on the left. Vascular: No hyperdense vessel or unexpected calcification. Skull: Normal. Negative for fracture or focal lesion. Sinuses/Orbits: No acute finding. Other: None. IMPRESSION: No acute abnormality noted. Small lacunar infarct is noted as described. Electronically Signed   By: Inez Catalina M.D.   On: 06/13/2017 17:27    Procedures Procedures (including critical care time)  Medications Ordered in ED Medications - No data to display   Initial Impression / Assessment and Plan / ED Course  I have reviewed the triage vital signs and the  nursing notes.  Pertinent labs & imaging results that were available during my care of the patient were reviewed by me and considered in my medical decision making (see chart for details).    My concern is for lacunar right hemispheric infarct. Patient has multiple risk factors and known vascular disease with status post bypass surgery. No indication for medics for code stroke as he is several days into symptoms and has been symptomatically since greater than 12 hours ago today, also low NIH. CT scan shows left basal ganglia are lacunar infarct. Does not show right hemispheric abnormalities.  I'm concerned that this is embolic or thrombotic CVA not evident on CT scanning. She's had 2 episodes over the last 5 days with left ventricular me symptoms now resolved, and persistent left facial weakness.  Strangely, although he has significant right-sided bruit his carotid Dopplers done one year ago showed no right-sided carotid disease, and less than 47% left carotid disease.  I discussed transfer for admission, MRI, and further stroke evaluation with patient. I have placed a call to the hospitalist regarding this.  Final Clinical Impressions(s) / ED Diagnoses   Final diagnoses:  Cerebrovascular accident (CVA), unspecified mechanism [facility])    ED Discharge Orders    None       Tanna Furry, MD 06/13/17 1759

## 2017-06-13 NOTE — H&P (Signed)
History and Physical    Jason Bailey:[id number] DOB: 08-Nov-1950 DOA: 06/13/2017  PCP: Coy Saunas, MD  Patient coming from:  home  Chief Complaint:  Dizziness, left facial numb  HPI: Jason Bailey is a 67 y.o. male with medical history significant of HTN, HLD, DM comes in with 2 weeks of persistent dizziness and with facial left numbness that started yesterday that has improved but not resolved along with feeling like both hands are dropping stuff.  He denies any weakness in arms or legs.  But feels more clumsy.  No fevers.  Pt takes a baby aspirin a day already.  He went to [facility] and ct shows an acute infarct.  Pt referred for admission for further work up of likely stroke.  Review of Systems: As per HPI otherwise 10 point review of systems negative.   Past Medical History:  Diagnosis Date  . Chest pain   . Diabetes mellitus   . Hyperlipidemia   . Hypertension   . Malaise and fatigue   . Osteoarthritis   . Right foot drop   . Right shoulder pain     Past Surgical History:  Procedure Laterality Date  . 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 KNEE SURGERY       reports that he has quit smoking. he has never used smokeless tobacco. He reports that he does not drink alcohol or use drugs.  No Known Allergies  No family history on file.  No premature CAD  Prior to Admission medications   Medication Sig Start Date End Date Taking? Authorizing Provider  cholecalciferol (VITAMIN D) 1000 units tablet Take 1,000 Units by mouth daily.   Yes [provider]  aspirin 81 MG tablet Take 81 mg by mouth daily.      [provider]  citalopram (CELEXA) 10 MG tablet Take 5 mg by mouth daily. 05/11/16   [provider]  dapagliflozin propanediol (FARXIGA) 5 MG TABS tablet Take 2.5 mg by mouth daily.    [provider]  fluticasone (FLONASE) 50 MCG/ACT nasal spray Place 2 sprays into both nostrils daily as needed for allergies. 05/25/15    [provider]  lisinopril (PRINIVIL,ZESTRIL) 10 MG tablet Take 10 mg by mouth daily.    [provider]  metFORMIN (GLUMETZA) 500 MG (MOD) 24 hr tablet 1 TAB BY MOUTH IN THE AM AND 2 TABS BY MOUTH IN THE PM    [provider]  nitroGLYCERIN (NITROSTAT) 0.4 MG SL tablet Place 1 tablet (0.4 mg total) under the tongue every 5 (five) minutes as needed for chest pain. 01/12/13   Josue Hector, MD  pravastatin (PRAVACHOL) 40 MG tablet Take 40 mg by mouth daily.    [provider]  PRILOSEC OTC 20 MG tablet Take 1 tablet by mouth daily. 12/30/12   [provider]  testosterone cypionate (DEPOTESTOTERONE CYPIONATE) 200 MG/ML injection Inject 50 mg into the muscle every 14 (fourteen) days.      [provider]  traMADol (ULTRAM) 50 MG tablet Take 1 tablet by mouth as directed.  12/29/12   [provider]  vitamin B-12 (CYANOCOBALAMIN) 500 MCG tablet Take 500 mcg by mouth daily.      [provider]    Physical Exam: Vitals:   06/13/17 1804 06/13/17 1829 06/13/17 1924 06/13/17 2057  BP: 126/68  120/60 119/62  Pulse: 62 60 63 60  Resp: 16 15 18  18  Temp:    98 F (36.7 C)  TempSrc:    Oral  SpO2: 99% 100% 99% 100%  Weight:    71.7 kg (158 lb)  Height:    5\' 7"  (1.702 m)      Constitutional: NAD, calm, comfortable Vitals:   06/13/17 1804 06/13/17 1829 06/13/17 1924 06/13/17 2057  BP: 126/68  120/60 119/62  Pulse: 62 60 63 60  Resp: 16 15 18 18   Temp:    98 F (36.7 C)  TempSrc:    Oral  SpO2: 99% 100% 99% 100%  Weight:    71.7 kg (158 lb)  Height:    5\' 7"  (1.702 m)   Eyes: PERRL, lids and conjunctivae normal ENMT: Mucous membranes are moist. Posterior pharynx clear of any exudate or lesions.Normal dentition.  Neck: normal, supple, no masses, no thyromegaly Respiratory: clear to auscultation bilaterally, no wheezing, no crackles. Normal respiratory effort. No accessory muscle use.  Cardiovascular: Regular rate and  rhythm, no murmurs / rubs / gallops. No extremity edema. 2+ pedal pulses. No carotid bruits.  Abdomen: no tenderness, no masses palpated. No hepatosplenomegaly. Bowel sounds positive.  Musculoskeletal: no clubbing / cyanosis. No joint deformity upper and lower extremities. Good ROM, no contractures. Normal muscle tone.  Skin: no rashes, lesions, ulcers. No induration Neurologic: CN 2-12 grossly intact. Sensation intact, DTR normal. Strength 5/5 in all 4.  Psychiatric: Normal judgment and insight. Alert and oriented x 3. Normal mood.    Labs on Admission: I have personally reviewed following labs and imaging studies  CBC: Recent Labs  Lab 06/13/17 1700  WBC 8.0  NEUTROABS 5.0  HGB 15.8  HCT 45.6  MCV 92.7  PLT 161   Basic Metabolic Panel: Recent Labs  Lab 06/13/17 1700  NA 136  K 3.9  CL 101  CO2 26  GLUCOSE 215*  BUN 11  CREATININE 0.58*  CALCIUM 9.2   GFR: Estimated Creatinine Clearance: 87.2 mL/min (A) (by C-G formula based on SCr of 0.58 mg/dL (L)). Liver Function Tests: No results for input(s): AST, ALT, ALKPHOS, BILITOT, PROT, ALBUMIN in the last 168 hours. No results for input(s): LIPASE, AMYLASE in the last 168 hours. No results for input(s): AMMONIA in the last 168 hours. Coagulation Profile: No results for input(s): INR, PROTIME in the last 168 hours. Cardiac Enzymes: No results for input(s): CKTOTAL, CKMB, CKMBINDEX, TROPONINI in the last 168 hours. BNP (last 3 results) No results for input(s): PROBNP in the last 8760 hours. HbA1C: No results for input(s): HGBA1C in the last 72 hours. CBG: No results for input(s): GLUCAP in the last 168 hours. Lipid Profile: No results for input(s): CHOL, HDL, LDLCALC, TRIG, CHOLHDL, LDLDIRECT in the last 72 hours. Thyroid Function Tests: No results for input(s): TSH, T4TOTAL, FREET4, T3FREE, THYROIDAB in the last 72 hours. Anemia Panel: No results for input(s): VITAMINB12, FOLATE, FERRITIN, TIBC, IRON, RETICCTPCT in  the last 72 hours. Urine analysis:    Component Value Date/Time   COLORURINE YELLOW 02/12/2007 2301   APPEARANCEUR CLOUDY (A) 02/12/2007 2301   LABSPEC 1.044 (H) 02/12/2007 2301   PHURINE 5.5 02/12/2007 2301   GLUCOSEU 500 (A) 02/12/2007 2301   HGBUR NEGATIVE 02/12/2007 2301   BILIRUBINUR NEGATIVE 02/12/2007 2301   KETONESUR NEGATIVE 02/12/2007 2301   PROTEINUR NEGATIVE 02/12/2007 2301   UROBILINOGEN 1.0 02/12/2007 2301   NITRITE NEGATIVE 02/12/2007 2301   LEUKOCYTESUR  02/12/2007 2301    NEGATIVE MICROSCOPIC NOT DONE ON URINES WITH NEGATIVE PROTEIN, BLOOD, LEUKOCYTES, NITRITE, OR GLUCOSE <  1000 mg/dL.   Sepsis Labs: !!!!!!!!!!!!!!!!!!!!!!!!!!!!!!!!!!!!!!!!!!!! @LABRCNTIP (procalcitonin:4,lacticidven:4) )No results found for this or any previous visit (from the past 240 hour(s)).   Radiological Exams on Admission: Ct Head Wo Contrast  Result Date: 06/13/2017 CLINICAL DATA:  Dizziness for 2 weeks EXAM: CT HEAD WITHOUT CONTRAST TECHNIQUE: Contiguous axial images were obtained from the base of the skull through the vertex without intravenous contrast. COMPARISON:  07/14/2013 FINDINGS: Brain: No evidence of acute infarction, hemorrhage, hydrocephalus, extra-axial collection or mass lesion/mass effect. A small lacunar infarct is noted along the inferior aspect of the basal ganglia on the left. Vascular: No hyperdense vessel or unexpected calcification. Skull: Normal. Negative for fracture or focal lesion. Sinuses/Orbits: No acute finding. Other: None. IMPRESSION: No acute abnormality noted. Small lacunar infarct is noted as described. Electronically Signed   By: Inez Catalina M.D.   On: 06/13/2017 17:27    EKG: Independently reviewed. nsr Old chart reviewed Case discussed with edp at [facility]   Assessment/Plan 67 yo male with small left lucanar infarct  Principal Problem:   Stroke [facility])- full dose of aspirin.  freq neuro cks.  flp and hga1c in am.  Obtain cardiac echo, carotid dopplers, along  with mri brain and mra head.  Stroke pathway initiated.  Active Problems:   Diabetes mellitus type 2, uncomplicated ([place])- ssi, hold oral agents   Essential hypertension- cont home meds   Bilateral carotid bruits- noted, work up as above    DVT prophylaxis:  scds Code Status:  full Family Communication:  none Disposition Plan:  Per day team Consults called:  none Admission status:  observation   Aaronjames Kelsay A MD [facility]  If 7PM-7AM, please contact night-coverage [email or web address] Password [email or web address]  06/13/2017, 11:02 PM

## 2017-06-13 NOTE — ED Notes (Signed)
Patient transported to CT 

## 2017-06-14 ENCOUNTER — Other Ambulatory Visit: Payer: Self-pay

## 2017-06-14 ENCOUNTER — Encounter (HOSPITAL_COMMUNITY): Payer: BC Managed Care – PPO

## 2017-06-14 ENCOUNTER — Observation Stay (HOSPITAL_BASED_OUTPATIENT_CLINIC_OR_DEPARTMENT_OTHER): Payer: BC Managed Care – PPO

## 2017-06-14 ENCOUNTER — Observation Stay (HOSPITAL_COMMUNITY): Payer: BC Managed Care – PPO

## 2017-06-14 DIAGNOSIS — R0989 Other specified symptoms and signs involving the circulatory and respiratory systems: Secondary | ICD-10-CM

## 2017-06-14 DIAGNOSIS — I639 Cerebral infarction, unspecified: Secondary | ICD-10-CM | POA: Diagnosis not present

## 2017-06-14 DIAGNOSIS — G459 Transient cerebral ischemic attack, unspecified: Secondary | ICD-10-CM

## 2017-06-14 DIAGNOSIS — I5033 Acute on chronic diastolic (congestive) heart failure: Secondary | ICD-10-CM | POA: Diagnosis not present

## 2017-06-14 DIAGNOSIS — E119 Type 2 diabetes mellitus without complications: Secondary | ICD-10-CM | POA: Diagnosis not present

## 2017-06-14 LAB — LIPID PANEL
CHOL/HDL RATIO: 6 ratio
Cholesterol: 156 mg/dL (ref 0–200)
HDL: 26 mg/dL — AB (ref >40)
LDL CALC: 89 mg/dL (ref 0–99)
Triglycerides: 206 mg/dL — ABNORMAL HIGH (ref <150)
VLDL: 41 mg/dL — ABNORMAL HIGH (ref 0–40)

## 2017-06-14 LAB — GLUCOSE, CAPILLARY
GLUCOSE-CAPILLARY: 233 mg/dL — AB (ref 65–99)
GLUCOSE-CAPILLARY: 234 mg/dL — AB (ref 65–99)
Glucose-Capillary: 177 mg/dL — ABNORMAL HIGH (ref 65–99)

## 2017-06-14 LAB — ECHOCARDIOGRAM COMPLETE
CHL CUP MV DEC (S): 250
E decel time: 250 msec
FS: 27 % — AB (ref 28–44)
HEIGHTINCHES: 67 in
IVS/LV PW RATIO, ED: 1
LA diam end sys: 48 mm
LA diam index: 2.59 cm/m2
LA vol A4C: 52.2 ml
LA vol index: 29.5 mL/m2
LA vol: 54.5 mL
LASIZE: 48 mm
LV PW d: 11 mm — AB (ref 0.6–1.1)
LV TDI E'MEDIAL: 7.51
LVELAT: 9.68 cm/s
LVOT area: 3.8 cm2
LVOT diameter: 22 mm
Lateral S' vel: 9.9 cm/s
MV pk E vel: 0.9 m/s
TAPSE: 23.5 mm
TDI e' lateral: 9.68
Weight: 2528 oz

## 2017-06-14 LAB — HEMOGLOBIN A1C
Hgb A1c MFr Bld: 9 % — ABNORMAL HIGH (ref 4.8–5.6)
Mean Plasma Glucose: 211.6 mg/dL

## 2017-06-14 MED ORDER — METFORMIN HCL ER (MOD) 1000 MG PO TB24: 1000.0000 mg | ORAL_TABLET | Freq: Two times a day (BID) | ORAL | 2 refills | Status: DC

## 2017-06-14 MED ORDER — METFORMIN HCL 1000 MG PO TABS: 1000.0000 mg | ORAL_TABLET | Freq: Two times a day (BID) | ORAL | 3 refills | Status: DC

## 2017-06-14 MED ORDER — ASPIRIN 325 MG PO TABS: 325.0000 mg | ORAL_TABLET | Freq: Every day | ORAL | 3 refills | Status: DC

## 2017-06-14 MED ORDER — PRAVASTATIN SODIUM 40 MG PO TABS: 40.0000 mg | ORAL_TABLET | Freq: Every day | ORAL | 1 refills | Status: AC

## 2017-06-14 NOTE — Discharge Summary (Signed)
Physician Discharge Summary   Patient ID: Jason Bailey MRN: [id number] DOB/AGE: 65-30-1952 65 y.o.  Admit date: 06/13/2017 Discharge date: 06/14/2017  Primary Care Physician:  Coy Saunas, MD  Discharge Diagnoses:    . Essential hypertension . Bilateral carotid bruits . TIA (transient ischemic attack) hyperlipidemia   Consults:  None   Recommendations for Outpatient Follow-up:  1. Metformin increased to 1000mg  BID 2. Placed on ASA 325mg  daily, statin  3. Please repeat CBC/BMET at next visit    DIET:carb modified     Allergies:  No Known Allergies   DISCHARGE MEDICATIONS: Allergies as of 06/14/2017   No Known Allergies     Medication List    STOP taking these medications   PRILOSEC OTC 20 MG tablet Generic drug:  omeprazole   traMADol 50 MG tablet Commonly known as:  ULTRAM     TAKE these medications   aspirin 325 MG tablet Take 1 tablet (325 mg total) by mouth daily. What changed:    medication strength  how much to take  Another medication with the same name was removed. Continue taking this medication, and follow the directions you see here.   cholecalciferol 1000 units tablet Commonly known as:  VITAMIN D Take 1,000 Units by mouth daily.   citalopram 10 MG tablet Commonly known as:  CELEXA Take 5 mg by mouth daily.   FARXIGA 5 MG Tabs tablet Generic drug:  dapagliflozin propanediol Take 2.5 mg by mouth daily.   fluticasone 50 MCG/ACT nasal spray Commonly known as:  FLONASE Place 2 sprays into both nostrils daily as needed for allergies.   lisinopril 10 MG tablet Commonly known as:  PRINIVIL,ZESTRIL Take 5 mg by mouth daily.   metFORMIN 1000 MG tablet Commonly known as:  GLUCOPHAGE Take 1 tablet (1,000 mg total) by mouth 2 (two) times daily with a meal. What changed:    medication strength  how much to take  additional instructions  Another medication with the same name was removed. Continue taking this medication, and follow  the directions you see here.   nitroGLYCERIN 0.4 MG SL tablet Commonly known as:  NITROSTAT Place 1 tablet (0.4 mg total) under the tongue every 5 (five) minutes as needed for chest pain.   pravastatin 40 MG tablet Commonly known as:  PRAVACHOL Take 1 tablet (40 mg total) by mouth daily. What changed:  how much to take   testosterone cypionate 200 MG/ML injection Commonly known as:  DEPOTESTOSTERONE CYPIONATE Inject 50 mg into the muscle every 14 (fourteen) days.   vitamin B-12 500 MCG tablet Commonly known as:  CYANOCOBALAMIN Take 500 mcg by mouth daily.        Brief H and P: For complete details please refer to admission H and P, but in briefPatient is a 67 year old male with hypertension, hyperlipidemia, diabetes presented with 2 weeks of persistent dizziness, facial left numbness that started the day before the admission, has improved but not resolved along with feeling like both hands were dropping stuff.  Denied any weakness in the arms or legs.   Hospital Course:  TIA (transient ischemic attack)  with dizziness, facial left numbness, symptoms improved -The head with no acute abnormality but small lacunar infarct noted along the inferior aspect of basal ganglia on the left -MRI of the brain negative for acute infarct, mild chronic changes in the white matter and pons likely due to microvascular ischemia, no emergent large vessel intracranial occlusion -Increase aspirin to 325 mg daily (on ASA 81  mg daily PTA) -2D echo showed EF 10-93%, grade 1 diastolic dysfunction, no cardiac source of emboli - carotid Dopplers showed 1-39% ICA stenosis bilaterally -Lipid panel showed HDL 26, LDL 89, hemoglobin A1c 9.0, continue statin -PT OT evaluation showed no PT needed, patient back to baseline    Diabetes mellitus type 2, uncomplicated ([facility]CC) -Hemoglobin A1c 9.0, uncontrolled -Increase metformin to 1000 mg twice a day at the time of discharge -Continue sliding scale insulin while  inpatient    Essential hypertension -BP stable, continue lisinopril  Hyperlipidemia -LDL 89, goal <70 continue pravastatin, increase to 40 mg daily    Bilateral carotid bruits -Carotid Doppler showed 1-39% ICA stenosis bilaterally     Day of Discharge BP 130/64 (BP Location: Right Arm)   Pulse 84   Temp 98.1 F (36.7 C) (Oral)   Resp 18   Ht 5\' 7"  (1.702 m)   Wt 71.7 kg (158 lb)   SpO2 98%   BMI 24.75 kg/m   Physical Exam: General: Alert and awake oriented x3 not in any acute distress. HEENT: anicteric sclera, pupils reactive to light and accommodation CVS: S1-S2 clear no murmur rubs or gallops Chest: clear to auscultation bilaterally, no wheezing rales or rhonchi Abdomen: soft nontender, nondistended, normal bowel sounds Extremities: no cyanosis, clubbing or edema noted bilaterally Neuro: Cranial nerves II-XII intact, no focal neurological deficits   The results of significant diagnostics from this hospitalization (including imaging, microbiology, ancillary and laboratory) are listed below for reference.    LAB RESULTS: Basic Metabolic Panel: Recent Labs  Lab 06/13/17 1700  NA 136  K 3.9  CL 101  CO2 26  GLUCOSE 215*  BUN 11  CREATININE 0.58*  CALCIUM 9.2   Liver Function Tests: No results for input(s): AST, ALT, ALKPHOS, BILITOT, PROT, ALBUMIN in the last 168 hours. No results for input(s): LIPASE, AMYLASE in the last 168 hours. No results for input(s): AMMONIA in the last 168 hours. CBC: Recent Labs  Lab 06/13/17 1700  WBC 8.0  NEUTROABS 5.0  HGB 15.8  HCT 45.6  MCV 92.7  PLT 232   Cardiac Enzymes: No results for input(s): CKTOTAL, CKMB, CKMBINDEX, TROPONINI in the last 168 hours. BNP: Invalid input(s): POCBNP CBG: Recent Labs  Lab 06/14/17 0709 06/14/17 1226  GLUCAP 177* 234*    Significant Diagnostic Studies:  Ct Head Wo Contrast  Result Date: 06/13/2017 CLINICAL DATA:  Dizziness for 2 weeks EXAM: CT HEAD WITHOUT CONTRAST  TECHNIQUE: Contiguous axial images were obtained from the base of the skull through the vertex without intravenous contrast. COMPARISON:  07/14/2013 FINDINGS: Brain: No evidence of acute infarction, hemorrhage, hydrocephalus, extra-axial collection or mass lesion/mass effect. A small lacunar infarct is noted along the inferior aspect of the basal ganglia on the left. Vascular: No hyperdense vessel or unexpected calcification. Skull: Normal. Negative for fracture or focal lesion. Sinuses/Orbits: No acute finding. Other: None. IMPRESSION: No acute abnormality noted. Small lacunar infarct is noted as described. Electronically Signed   By: Inez Catalina M.D.   On: 06/13/2017 17:27   Mr Brain Wo Contrast  Result Date: 06/14/2017 CLINICAL DATA:  Stroke follow-up EXAM: MRI HEAD WITHOUT CONTRAST MRA HEAD WITHOUT CONTRAST TECHNIQUE: Multiplanar, multiecho pulse sequences of the brain and surrounding structures were obtained without intravenous contrast. Angiographic images of the head were obtained using MRA technique without contrast. COMPARISON:  CT 06/13/2017 FINDINGS: MRI HEAD FINDINGS Brain: Negative for acute infarction. Several small subcortical white matter hyperintensities left frontal lobe. Hyperintensity in the central pons.  Negative for hemorrhage or mass. Ventricle size and cerebral volume normal for age. Vascular: Normal arterial flow void Skull and upper cervical spine: Negative Sinuses/Orbits: Mild mucosal edema paranasal sinuses.  Normal orbit. Other: None MRA HEAD FINDINGS Both vertebral arteries are patent to the basilar without stenosis. Basilar widely patent. PICA, superior cerebellar, and posterior cerebral artery is normal bilaterally. Abnormal cluster of vessels in the superior cerebellar vermis with a prominent draining vein extending into the torcula. This may be supplied by the left posterior cerebral artery which is larger than the right and extends into the vascular cluster. Findings suspicious  for arteriovenous malformation. Internal carotid artery widely patent bilaterally. Anterior and middle cerebral arteries widely patent bilaterally. IMPRESSION: Negative for acute infarct. Mild chronic changes in the white matter and pons likely due to microvascular ischemia No emergent large vessel intracranial occlusion. 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 Recommend CT head for further evaluation. Electronically Signed   By: Franchot Gallo M.D.   On: 06/14/2017 08:52   Mr Jodene Nam Head Wo Contrast  Result Date: 06/14/2017 CLINICAL DATA:  Stroke follow-up EXAM: MRI HEAD WITHOUT CONTRAST MRA HEAD WITHOUT CONTRAST TECHNIQUE: Multiplanar, multiecho pulse sequences of the brain and surrounding structures were obtained without intravenous contrast. Angiographic images of the head were obtained using MRA technique without contrast. COMPARISON:  CT 06/13/2017 FINDINGS: MRI HEAD FINDINGS Brain: Negative for acute infarction. Several small subcortical white matter hyperintensities left frontal lobe. Hyperintensity in the central pons. Negative for hemorrhage or mass. Ventricle size and cerebral volume normal for age. Vascular: Normal arterial flow void Skull and upper cervical spine: Negative Sinuses/Orbits: Mild mucosal edema paranasal sinuses.  Normal orbit. Other: None MRA HEAD FINDINGS Both vertebral arteries are patent to the basilar without stenosis. Basilar widely patent. PICA, superior cerebellar, and posterior cerebral artery is normal bilaterally. Abnormal cluster of vessels in the superior cerebellar vermis with a prominent draining vein extending into the torcula. This may be supplied by the left posterior cerebral artery which is larger than the right and extends into the vascular cluster. Findings suspicious for arteriovenous malformation. Internal carotid artery widely patent bilaterally. Anterior and middle cerebral arteries widely patent bilaterally. IMPRESSION: Negative for acute  infarct. Mild chronic changes in the white matter and pons likely due to microvascular ischemia No emergent large vessel intracranial occlusion. 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 Recommend CT head for further evaluation. Electronically Signed   By: Franchot Gallo M.D.   On: 06/14/2017 08:52    2D ECHO: Study Conclusions  - Left ventricle: The cavity size was normal. Wall thickness was   normal. Systolic function was normal. The estimated ejection   fraction was in the range of 55% to 60%. Wall motion was normal;   there were no regional wall motion abnormalities. Doppler   parameters are consistent with abnormal left ventricular   relaxation (grade 1 diastolic dysfunction). - Aortic valve: Trileaflet; mildly thickened, mildly calcified   leaflets. - Mitral valve: There was trivial regurgitation. - Left atrium: The atrium was mildly dilated.  Impressions:  - No cardiac source of emboli was indentified.  Disposition and Follow-up: Discharge Instructions    Diet Carb Modified   Complete by:  As directed    Increase activity slowly   Complete by:  As directed        DISPOSITION:  Home    DISCHARGE FOLLOW-UP Follow-up Information    Coy Saunas, MD. Schedule an appointment as soon as possible  for a visit in 2 week(s).   Specialty:  Family Medicine Contact information: [address] [place] [address] [phone]            Time spent on Discharge: 102mins   Signed:   Estill Cotta M.D. [facility] 06/14/2017, 4:23 PM Pager: [phone]

## 2017-06-14 NOTE — Progress Notes (Signed)
OT Cancellation Note  Patient Details Name: Jason Bailey MRN: [id number] DOB: [birth year]   Cancelled Treatment:    Reason Eval/Treat Not Completed: OT screened, no needs identified, will sign off  Britt Bottom 06/14/2017, 1:35 PM

## 2017-06-14 NOTE — Care Management Note (Signed)
Case Management Note  Patient Details  Name: Jason Bailey MRN: [id number] Date of Birth: 12/01/1950  Subjective/Objective:                    Action/Plan: Pt discharging home with self care. Pt has PCP, insurance and transportation home. No further needs per CM.   Expected Discharge Date:  06/14/17               Expected Discharge Plan:  Home/Self Care  In-House Referral:     Discharge planning Services     Post Acute Care Choice:    Choice offered to:     DME Arranged:    DME Agency:     HH Arranged:    HH Agency:     Status of Service:  Completed, signed off  If discussed at [employer] of Stay Meetings, dates discussed:    Additional Comments:  Pollie Friar, RN 06/14/2017, 4:36 PM

## 2017-06-14 NOTE — Progress Notes (Signed)
Bilateral Carotid completed - Bilateral - 1% to 39% ICA stenosis. Vertebral artery flow is antegrade. [facility], [address] 06/14/2017, 1:26 PM

## 2017-06-14 NOTE — Care Management Note (Signed)
Case Management Note  Patient Details  Name: Jason Bailey MRN: [id number] Date of Birth: 22-Feb-1951  Subjective/Objective:     Pt admitted to r/o CVA. He is from home with spouse.               Action/Plan: No f/u per PT. Await OT eval. CM following for d/c needs, physician orders.   Expected Discharge Date:                  Expected Discharge Plan:  Home/Self Care  In-House Referral:     Discharge planning Services     Post Acute Care Choice:    Choice offered to:     DME Arranged:    DME Agency:     HH Arranged:    HH Agency:     Status of Service:  In process, will continue to follow  If discussed at Long Length of Stay Meetings, dates discussed:    Additional Comments:  Pollie Friar, RN 06/14/2017, 3:08 PM

## 2017-06-14 NOTE — Evaluation (Signed)
Physical Therapy Evaluation/Discharge Patient Details Name: Jason Bailey MRN: [id number] DOB: [birth year] Today's Date: 06/14/2017   History of Present Illness  67 y.o. male presents with left-sided facial numbness and 2-week history of dizziness and clumsiness. CT and MRI negative for acute changes. PMH significant of HTN, DM, HLD, OA, and right foot drop.   Clinical Impression  Pt tolerates ambulation and stair negotiation without AD and supervision only. Pt demonstrates independence with bed mobility and sit-to-stand transfers. Pt reports that his symptoms of dizziness occur sometimes when he is standing and turns his head one way or the other. Unable to elicit symptoms of dizziness during PT session today. Pt works as Development worker, international aid. Pt is near baseline level of functioning and does not require further skilled PT acutely. No PT follow-up required. Pt understands and agrees to PT plan.     Follow Up Recommendations No PT follow up    Equipment Recommendations  None recommended by PT    Recommendations for Other Services       Precautions / Restrictions Restrictions Weight Bearing Restrictions: No      Mobility  Bed Mobility Overal bed mobility: Independent             General bed mobility comments: Pt springs up out of bed independently without difficulty and did not require any verbal cueing.   Transfers Overall transfer level: Independent Equipment used: None Transfers: Sit to/from Stand Sit to Stand: Independent         General transfer comment: STS x1 from EOB without difficulty. Pt in chair post-ambulation.   Ambulation/Gait Ambulation/Gait assistance: Supervision Ambulation Distance (Feet): 150 Feet Assistive device: None Gait Pattern/deviations: Step-through pattern;Decreased dorsiflexion - right Gait velocity: normal   General Gait Details: Pt ambulates with increased foot clearance of right LE due to right foot drop, which is his baseline.    Stairs Stairs: Yes Stairs assistance: Supervision Stair Management: One rail Right;Alternating pattern;One rail Left Number of Stairs: 10 General stair comments: Pt with light touch on right railing during stair ascent and light touch on left railing on stair descent. Pt intermittently without use of railing during ascent/descent.   Wheelchair Mobility    Modified Rankin (Stroke Patients Only) Modified Rankin (Stroke Patients Only) Pre-Morbid Rankin Score: No significant disability Modified Rankin: No significant disability     Balance Overall balance assessment: Independent Sitting-balance support: No upper extremity supported;Feet supported Sitting balance-Leahy Scale: Good Sitting balance - Comments: Pt demonstrates sitting balance EOB without UE support and ability to hold trunk steady during MMT of UEs and LEs.      Standing balance-Leahy Scale: Good Standing balance comment: Pt demosntrates no LOB or issues with static and dynamic standing balance. Able to negotiate stairs and turn head during walking without LOB.                              Pertinent Vitals/Pain Pain Assessment: No/denies pain    Home Living Family/patient expects to be discharged to:: Private residence Living Arrangements: Spouse/significant other Available Help at Discharge: Family;Available PRN/intermittently Type of Home: House Home Access: Stairs to enter Entrance Stairs-Rails: Chemical engineer of Steps: 3 Home Layout: Other (Comment)(split-level with a few steps in between levels)        Prior Function Level of Independence: Independent               Hand Dominance        Extremity/Trunk Assessment  Upper Extremity Assessment Upper Extremity Assessment: Defer to OT evaluation;Overall [facility] for tasks assessed    Lower Extremity Assessment Lower Extremity Assessment: RLE deficits/detail(5/5 strength grossly BLEs (minus right DF)) RLE Deficits /  Details: right foot drop, unable to DF against gravity (baseline)    Cervical / Trunk Assessment Cervical / Trunk Assessment: Normal  Communication   Communication: No difficulties  Cognition Arousal/Alertness: Awake/alert Behavior During Therapy: WFL for tasks assessed/performed Overall Cognitive Status: Within Functional Limits for tasks assessed                                        General Comments General comments (skin integrity, edema, etc.): Pt describes dizziness comes about sometimes when he turns his head to the right or left. Unable to elicit symtpmoms during today's session. Pt works in Biomedical scientist.     Exercises     Assessment/Plan    PT Assessment Patent does not need any further PT services  PT Problem List         PT Treatment Interventions      PT Goals (Current goals can be found in the Care Plan section)  Acute Rehab PT Goals Patient Stated Goal: to go home PT Goal Formulation: All assessment and education complete, DC therapy Time For Goal Achievement: 06/28/17 Potential to Achieve Goals: Good    Frequency     Barriers to discharge        Co-evaluation               AM-PAC PT "6 Clicks" Daily Activity  Outcome Measure Difficulty turning over in bed (including adjusting bedclothes, sheets and blankets)?: None Difficulty moving from lying on back to sitting on the side of the bed? : None Difficulty sitting down on and standing up from a chair with arms (e.g., wheelchair, bedside commode, etc,.)?: None Help needed moving to and from a bed to chair (including a wheelchair)?: None Help needed walking in hospital room?: None Help needed climbing 3-5 steps with a railing? : None 6 Click Score: 24    End of Session Equipment Utilized During Treatment: Gait belt Activity Tolerance: Patient tolerated treatment well Patient left: in chair;with call bell/phone within reach   PT Visit Diagnosis: Unsteadiness on feet  (R26.81);Dizziness and giddiness (R42)    Time: 9024-0973 PT Time Calculation (min) (ACUTE ONLY): 12 min   Charges:   PT Evaluation $PT Eval Low Complexity: 1 Low     PT G Codes:        Judee Clara, SPT  Judee Clara 06/14/2017, 11:50 AM

## 2017-06-14 NOTE — Progress Notes (Signed)
  Echocardiogram 2D Echocardiogram has been performed.  Jason Bailey 06/14/2017, 2:04 PM

## 2017-06-14 NOTE — Progress Notes (Signed)
[facility]                                                                              Patient Demographics  Jason Bailey, is a 67 y.o. male, DOB - 10/21/1950, BZJ:[id number]  Admit date - 06/13/2017   Admitting Physician Jason Grout, MD  Outpatient Primary MD for the patient is Jason Saunas, MD  Outpatient specialists:   LOS - 0  days   Medical records reviewed and are as summarized below:    Chief Complaint  Patient presents with  . Numbness    Face       Brief summary   Patient is a 67 year old male with hypertension, hyperlipidemia, diabetes presented with 2 weeks of persistent dizziness, facial left numbness that started the day before the admission, has improved but not resolved along with feeling like both hands were dropping stuff.  Denied any weakness in the arms or legs.   Assessment & Plan    Principal Problem:   TIA (transient ischemic attack)  with dizziness, facial left numbness, symptoms improved -The head with no acute abnormality but small lacunar infarct noted along the inferior aspect of basal ganglia on the left -MRI of the brain negative for acute infarct, mild chronic changes in the white matter and pons likely due to microvascular ischemia, no emergent large vessel intracranial occlusion -Increase aspirin to 325 mg daily (on ASA 81 mg daily PTA), follow 2D echo, carotid Dopplers -Lipid panel showed HDL 26, LDL 89, hemoglobin A1c 9.0 -PT OT evaluation pending  Active Problems:   Diabetes mellitus type 2, uncomplicated (HCC) -Hemoglobin A1c 9.0, uncontrolled -Increase metformin to 1000 mg twice a day at the time of discharge -Continue sliding scale insulin while inpatient    Essential hypertension -BP stable, continue lisinopril  Hyperlipidemia -Continue pravastatin    Bilateral carotid bruits -Follow carotid Doppler  Code Status: Full code DVT Prophylaxis:   SCD's Family Communication: Discussed in detail with the  patient, all imaging results, lab results explained to the patient   Disposition Plan:  Time Spent in minutes   25  minutes  Procedures:  MRI brain   Consultants:   None   Antimicrobials:      Medications  Scheduled Meds: . aspirin  300 mg Rectal Daily   Or  . aspirin  325 mg Oral Daily  . insulin aspart  0-9 Units Subcutaneous TID WC  . lisinopril  10 mg Oral Daily  . pravastatin  40 mg Oral Daily   Continuous Infusions: PRN Meds:.acetaminophen **OR** acetaminophen (TYLENOL) oral liquid 160 mg/5 mL **OR** acetaminophen   Antibiotics   Anti-infectives (From admission, onward)   None        Subjective:   Jason Bailey was seen and examined today.  Feels a lot better today, symptoms improving. Patient denies dizziness, chest pain, shortness of breath, abdominal pain, N/V/D/C.  No acute events overnight.    Objective:   Vitals:   06/14/17 0300 06/14/17 0500 06/14/17 0700 06/14/17 1004  BP: 118/62 123/61 124/68 132/65  Pulse:  (!) 59 (!) 55 87  Resp:    18  Temp:  98.6 F (37 C)  TempSrc:    Oral  SpO2:    97%  Weight:      Height:        Intake/Output Summary (Last 24 hours) at 06/14/2017 1233 Last data filed at 06/14/2017 0716 Gross per 24 hour  Intake -  Output 700 ml  Net -700 ml     Wt Readings from Last 3 Encounters:  06/13/17 71.7 kg (158 lb)  06/26/16 74.3 kg (163 lb 12.8 oz)  06/24/15 70.8 kg (156 lb 1.9 oz)     Exam  General: Alert and oriented x 3, NAD  Eyes:  HEENT:  Atraumatic, normocephalic, normal oropharynx  Cardiovascular: S1 S2 auscultated, no rubs, murmurs or gallops. Regular rate and rhythm.  Respiratory: Clear to auscultation bilaterally, no wheezing, rales or rhonchi  Gastrointestinal: Soft, nontender, nondistended, + bowel sounds  Ext: no pedal edema bilaterally  Neuro: AAOx3, Cr N's II- XII. Strength 5/5 upper and lower extremities bilaterally, speech clear, sensations grossly intact  Musculoskeletal: No  digital cyanosis, clubbing  Skin: No rashes  Psych: Normal affect and demeanor, alert and oriented x3    Data Reviewed:  I have personally reviewed following labs and imaging studies  Micro Results No results found for this or any previous visit (from the past 240 hour(s)).  Radiology Reports Ct Head Wo Contrast  Result Date: 06/13/2017 CLINICAL DATA:  Dizziness for 2 weeks EXAM: CT HEAD WITHOUT CONTRAST TECHNIQUE: Contiguous axial images were obtained from the base of the skull through the vertex without intravenous contrast. COMPARISON:  07/14/2013 FINDINGS: Brain: No evidence of acute infarction, hemorrhage, hydrocephalus, extra-axial collection or mass lesion/mass effect. A small lacunar infarct is noted along the inferior aspect of the basal ganglia on the left. Vascular: No hyperdense vessel or unexpected calcification. Skull: Normal. Negative for fracture or focal lesion. Sinuses/Orbits: No acute finding. Other: None. IMPRESSION: No acute abnormality noted. Small lacunar infarct is noted as described. Electronically Signed   By: Jason Bailey M.D.   On: 06/13/2017 17:27   Mr Brain Wo Contrast  Result Date: 06/14/2017 CLINICAL DATA:  Stroke follow-up EXAM: MRI HEAD WITHOUT CONTRAST MRA HEAD WITHOUT CONTRAST TECHNIQUE: Multiplanar, multiecho pulse sequences of the brain and surrounding structures were obtained without intravenous contrast. Angiographic images of the head were obtained using MRA technique without contrast. COMPARISON:  CT 06/13/2017 FINDINGS: MRI HEAD FINDINGS Brain: Negative for acute infarction. Several small subcortical white matter hyperintensities left frontal lobe. Hyperintensity in the central pons. Negative for hemorrhage or mass. Ventricle size and cerebral volume normal for age. Vascular: Normal arterial flow void Skull and upper cervical spine: Negative Sinuses/Orbits: Mild mucosal edema paranasal sinuses.  Normal orbit. Other: None MRA HEAD FINDINGS Both vertebral  arteries are patent to the basilar without stenosis. Basilar widely patent. PICA, superior cerebellar, and posterior cerebral artery is normal bilaterally. Abnormal cluster of vessels in the superior cerebellar vermis with a prominent draining vein extending into the torcula. This may be supplied by the left posterior cerebral artery which is larger than the right and extends into the vascular cluster. Findings suspicious for arteriovenous malformation. Internal carotid artery widely patent bilaterally. Anterior and middle cerebral arteries widely patent bilaterally. IMPRESSION: Negative for acute infarct. Mild chronic changes in the white matter and pons likely due to microvascular ischemia No emergent large vessel intracranial occlusion. 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 Recommend CT head for further evaluation. Electronically Signed   By: Franchot Gallo M.D.   On:  06/14/2017 08:52   Mr Jodene Nam Head Wo Contrast  Result Date: 06/14/2017 CLINICAL DATA:  Stroke follow-up EXAM: MRI HEAD WITHOUT CONTRAST MRA HEAD WITHOUT CONTRAST TECHNIQUE: Multiplanar, multiecho pulse sequences of the brain and surrounding structures were obtained without intravenous contrast. Angiographic images of the head were obtained using MRA technique without contrast. COMPARISON:  CT 06/13/2017 FINDINGS: MRI HEAD FINDINGS Brain: Negative for acute infarction. Several small subcortical white matter hyperintensities left frontal lobe. Hyperintensity in the central pons. Negative for hemorrhage or mass. Ventricle size and cerebral volume normal for age. Vascular: Normal arterial flow void Skull and upper cervical spine: Negative Sinuses/Orbits: Mild mucosal edema paranasal sinuses.  Normal orbit. Other: None MRA HEAD FINDINGS Both vertebral arteries are patent to the basilar without stenosis. Basilar widely patent. PICA, superior cerebellar, and posterior cerebral artery is normal bilaterally. Abnormal cluster of  vessels in the superior cerebellar vermis with a prominent draining vein extending into the torcula. This may be supplied by the left posterior cerebral artery which is larger than the right and extends into the vascular cluster. Findings suspicious for arteriovenous malformation. Internal carotid artery widely patent bilaterally. Anterior and middle cerebral arteries widely patent bilaterally. IMPRESSION: Negative for acute infarct. Mild chronic changes in the white matter and pons likely due to microvascular ischemia No emergent large vessel intracranial occlusion. 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 Recommend CT head for further evaluation. Electronically Signed   By: Franchot Gallo M.D.   On: 06/14/2017 08:52    Lab Data:  CBC: Recent Labs  Lab 06/13/17 1700  WBC 8.0  NEUTROABS 5.0  HGB 15.8  HCT 45.6  MCV 92.7  PLT 161   Basic Metabolic Panel: Recent Labs  Lab 06/13/17 1700  NA 136  K 3.9  CL 101  CO2 26  GLUCOSE 215*  BUN 11  CREATININE 0.58*  CALCIUM 9.2   GFR: Estimated Creatinine Clearance: 87.2 mL/min (A) (by C-G formula based on SCr of 0.58 mg/dL (L)). Liver Function Tests: No results for input(s): AST, ALT, ALKPHOS, BILITOT, PROT, ALBUMIN in the last 168 hours. No results for input(s): LIPASE, AMYLASE in the last 168 hours. No results for input(s): AMMONIA in the last 168 hours. Coagulation Profile: No results for input(s): INR, PROTIME in the last 168 hours. Cardiac Enzymes: No results for input(s): CKTOTAL, CKMB, CKMBINDEX, TROPONINI in the last 168 hours. BNP (last 3 results) No results for input(s): PROBNP in the last 8760 hours. HbA1C: Recent Labs    06/14/17 0344  HGBA1C 9.0*   CBG: Recent Labs  Lab 06/14/17 0141 06/14/17 0709 06/14/17 1226  GLUCAP 233* 177* 234*   Lipid Profile: Recent Labs    06/14/17 0344  CHOL 156  HDL 26*  LDLCALC 89  TRIG 206*  CHOLHDL 6.0   Thyroid Function Tests: No results for  input(s): TSH, T4TOTAL, FREET4, T3FREE, THYROIDAB in the last 72 hours. Anemia Panel: No results for input(s): VITAMINB12, FOLATE, FERRITIN, TIBC, IRON, RETICCTPCT in the last 72 hours. Urine analysis:    Component Value Date/Time   COLORURINE YELLOW 02/12/2007 2301   APPEARANCEUR CLOUDY (A) 02/12/2007 2301   LABSPEC 1.044 (H) 02/12/2007 2301   PHURINE 5.5 02/12/2007 2301   GLUCOSEU 500 (A) 02/12/2007 2301   HGBUR NEGATIVE 02/12/2007 2301   BILIRUBINUR NEGATIVE 02/12/2007 2301   KETONESUR NEGATIVE 02/12/2007 2301   PROTEINUR NEGATIVE 02/12/2007 2301   UROBILINOGEN 1.0 02/12/2007 2301   NITRITE NEGATIVE 02/12/2007 2301   LEUKOCYTESUR  02/12/2007 2301  NEGATIVE MICROSCOPIC NOT DONE ON URINES WITH NEGATIVE PROTEIN, BLOOD, LEUKOCYTES, NITRITE, OR GLUCOSE <1000 mg/dL.     Estill Cotta M.D. [facility] 06/14/2017, 12:33 PM  Pager: [phone] Between 7am to 7pm - call Pager - [phone]  After 7pm go to [email or web address] - password [email or web address]  Call night coverage person covering after 7pm

## 2017-06-14 NOTE — Progress Notes (Signed)
SLP Cancellation Note  Patient Details Name: Jason Bailey MRN: [id number] DOB: [birth year]   Cancelled treatment:       Reason Eval/Treat Not Completed: SLP screened, no needs identified, will sign off   Aladdin Kollmann 06/14/2017, 1:54 PM

## 2017-06-14 NOTE — Progress Notes (Signed)
Nurse went over discharge with patient and family. Patient and family verbalized understanding of discharge. All questions and concerns addressed.    Discharging home with all belongings.  Patient left ambulatory.

## 2017-06-26 ENCOUNTER — Ambulatory Visit (HOSPITAL_COMMUNITY)
Admission: RE | Admit: 2017-06-26 | Payer: BC Managed Care – PPO | Source: Ambulatory Visit | Attending: Cardiovascular Disease | Admitting: Cardiovascular Disease

## 2017-07-23 NOTE — Progress Notes (Signed)
Patient ID: Jason Bailey, male   DOB: 08-11-1950, 67 y.o.   MRN: [id number] [name] is seen today in followup for coronary artery disease. Previous CABG . He had a nonischemic Myoview 2009 with normal LV function. He is much better than last time I saw him He has a new endocrine doctor in [place] and is off actos. His thyroid nodules are stable. In 4/10 he had a normal carotid duplex despite right bruit. Active with no SSCP. Compliant with meds. Primary checking cholesterol A1c in 6 range. Has mild calf pain with statin but tolerable.   CABG 2008 [place]  PROCEDURE: Median sternotomy, extracorporeal circulation, coronary  artery bypass grafting x4 (left internal mammary artery to left anterior  descending artery, saphenous vein graft to first diagonal, saphenous  vein graft to obtuse marginal #1, saphenous vein graft to posterior  descending), endoscopic vein harvest, right leg.  Carotid 06/14/17 reviewed plaque no stenosis  Echo reviewed 06/14/17 EF 55-60% Mild LAE Trivial AR/MR  Had Surgery for right foot drop in [place]  From previous accident   Only taking 1/2 his pravachol due to myalgias   Admitted to hospital 06/14/17 with dizziness and facial numbness Small lacunar infarct MRI no acute stroke  ASA increased to 325 mg  A1c was elevated at 9.0   ROS: Denies fever, malais, weight loss, blurry vision, decreased visual acuity, cough, sputum, SOB, hemoptysis, pleuritic pain, palpitaitons, heartburn, abdominal pain, melena, lower extremity edema, claudication, or rash.  All other systems reviewed and negative  General: BP (!) 154/74   Pulse 86   Ht '5\' 7"'$  (1.702 m)   Wt 163 lb 8 oz (74.2 kg)   BMI 25.61 kg/m  Affect appropriate Healthy:  appears stated age 68: normal Neck supple with no adenopathy JVP normal left  bruits no thyromegaly Lungs clear with no wheezing and good diaphragmatic motion Heart:  S1/S2 SEM  murmur, no rub, gallop or click PMI normal Abdomen: benighn,  BS positve, no tenderness, no AAA no bruit.  No HSM or HJR Distal pulses intact with no bruits No edema Neuro non-focal Skin warm and dry No muscular weakness    Current Outpatient Medications  Medication Sig Dispense Refill  . amoxicillin (AMOXIL) 875 MG tablet Take 875 mg by mouth as directed.    Marland Kitchen aspirin 325 MG tablet Take 1 tablet (325 mg total) by mouth daily. 30 tablet 3  . cholecalciferol (VITAMIN D) 1000 units tablet Take 1,000 Units by mouth daily.    . citalopram (CELEXA) 10 MG tablet Take 5 mg by mouth daily.    . dapagliflozin propanediol (FARXIGA) 5 MG TABS tablet Take 2.5 mg by mouth daily.    . fluticasone (FLONASE) 50 MCG/ACT nasal spray Place 2 sprays into both nostrils daily as needed for allergies.    Marland Kitchen lisinopril (PRINIVIL,ZESTRIL) 10 MG tablet Take 5 mg by mouth daily.     . metFORMIN (GLUCOPHAGE) 1000 MG tablet Take 1 tablet (1,000 mg total) by mouth 2 (two) times daily with a meal. 60 tablet 3  . nitroGLYCERIN (NITROSTAT) 0.4 MG SL tablet Place 1 tablet (0.4 mg total) under the tongue every 5 (five) minutes as needed for chest pain. 25 tablet 3  . pravastatin (PRAVACHOL) 40 MG tablet Take 1 tablet (40 mg total) by mouth daily. 30 tablet 1  . Testosterone Cypionate 200 MG/ML KIT Inject 25 mg into the muscle once a week.    . vitamin B-12 (CYANOCOBALAMIN) 500 MCG tablet Take 500 mcg by  mouth daily.       No current facility-administered medications for this visit.     Allergies  Patient has no known allergies.  Electrocardiogram:  SR rate 77 LVH  Old IMI  06/24/15  SR rate 63 LVH Q lead 3  06/27/15 SR rate 82 old IMI T inversions 3,F and V6   Assessment and Plan CAD/CABG:  2008  No angina continue medical Rx Chol: labs with primary on statin   Bruit:  1-39% bilateral plaque duplex 08/11/56  TIA: not embolic no PAF echo, carotids normal ASA f/u neuro DM:  Poorly controlled discussed issues with stroke and DM f/u primary continue metformin and farxiga      Jenkins Rouge

## 2017-07-24 ENCOUNTER — Encounter: Payer: Self-pay | Admitting: Cardiovascular Disease

## 2017-07-24 ENCOUNTER — Ambulatory Visit: Payer: BC Managed Care – PPO | Admitting: Cardiovascular Disease

## 2017-07-24 VITALS — BP 154/74 | HR 86 | Ht 67.0 in | Wt 163.5 lb

## 2017-07-24 DIAGNOSIS — I25810 Atherosclerosis of coronary artery bypass graft(s) without angina pectoris: Secondary | ICD-10-CM | POA: Diagnosis not present

## 2017-07-24 DIAGNOSIS — I10 Essential (primary) hypertension: Secondary | ICD-10-CM | POA: Diagnosis not present

## 2017-07-24 DIAGNOSIS — I6523 Occlusion and stenosis of bilateral carotid arteries: Secondary | ICD-10-CM

## 2017-07-24 DIAGNOSIS — E785 Hyperlipidemia, unspecified: Secondary | ICD-10-CM | POA: Diagnosis not present

## 2017-07-24 NOTE — Patient Instructions (Addendum)
Medication Instructions:  Your physician recommends that you continue on your current medications as directed. Please refer to the Current Medication list given to you today.  Labwork: NONE  Testing/Procedures: Your physician has requested that you have an echocardiogram in 1 year. Echocardiography is a painless test that uses sound waves to create images of your heart. It provides your doctor with information about the size and shape of your heart and how well your heart's chambers and valves are working. This procedure takes approximately one hour. There are no restrictions for this procedure.  Your physician has requested that you have a carotid duplex in one year. This test is an ultrasound of the carotid arteries in your neck. It looks at blood flow through these arteries that supply the brain with blood. Allow one hour for this exam. There are no restrictions or special instructions.  Follow-Up: Your physician wants you to follow-up in: 12 months with Dr. Johnsie Cancel. You will receive a reminder letter in the mail two months in advance. If you don't receive a letter, please call our office to schedule the follow-up appointment.   If you need a refill on your cardiac medications before your next appointment, please call your pharmacy.

## 2017-07-31 ENCOUNTER — Other Ambulatory Visit: Payer: Self-pay

## 2017-07-31 ENCOUNTER — Ambulatory Visit: Payer: BC Managed Care – PPO | Admitting: Neurology

## 2017-07-31 ENCOUNTER — Encounter: Payer: Self-pay | Admitting: Neurology

## 2017-07-31 VITALS — BP 125/71 | HR 60 | Resp 16 | Ht 67.0 in | Wt 163.0 lb

## 2017-07-31 DIAGNOSIS — G459 Transient cerebral ischemic attack, unspecified: Secondary | ICD-10-CM

## 2017-07-31 NOTE — Progress Notes (Signed)
PATIENT: Jason Bailey DOB: 05-05-1951  Chief Complaint  Patient presents with  . Numbness    Woke up 06/13/17 with right sided facial numbness, no other noted deficits.  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 Has not had any episodes of numbness since then. Taking ASA '325mg'$  daily. Currently being treated for a sinus infection/fim     HISTORICAL  Jason Bailey is a 67 year old male, seen in refer by his primary care physician Dr. Rosana Hoes, Jeneen Rinks for evaluation of right facial numbness, initial evaluation was on July 31, 2017.  He has past medical history of hypertension, hyperlipidemia, diabetes, coronary artery disease, open heart surgery, used to take aspirin 81 mg daily,  On June 12, 2017, he woke up in the middle of the night noticed left facial numbness, but denies numbness involving left hand of left leg, no dysarthria, no weakness, he went to sleep, when he woke up 3 -4 hours later, majority of the numbness has went away.  He was admitted to the hospital for TIA workup, I personally reviewed MRI of the brain, no acute abnormality, pontine area small vessel disease, MRA of the brain showed no large vessel disease, left superior cerebellum vermis area abnormal clusters of vessels,  CT head showed no acute abnormality,  Laboratory evaluation showed A1c was 9, LDL 89, 2D echo showed EF 63-01%, grade 1 diastolic dysfunction, no cardiac source of emboli carotid Dopplers showed 1-39% ICA stenosis bilaterally  His aspirin was changed to 325 mg daily, increase Metformin to 1000 mg twice a day,  Is now back to baseline,  REVIEW OF SYSTEMS: Full 14 system review of systems performed and notable only for as above  ALLERGIES: No Known Allergies  HOME MEDICATIONS: Current Outpatient Medications  Medication Sig Dispense Refill  . amoxicillin (AMOXIL) 875 MG tablet Take 875 mg by mouth as directed.    Marland Kitchen aspirin 325 MG tablet Take 1 tablet (325 mg total) by mouth daily.  30 tablet 3  . cholecalciferol (VITAMIN D) 1000 units tablet Take 1,000 Units by mouth daily.    . citalopram (CELEXA) 10 MG tablet Take 5 mg by mouth daily.    . dapagliflozin propanediol (FARXIGA) 5 MG TABS tablet Take 2.5 mg by mouth daily.    Marland Kitchen lisinopril (PRINIVIL,ZESTRIL) 10 MG tablet Take 5 mg by mouth daily.     . metFORMIN (GLUCOPHAGE) 1000 MG tablet Take 1 tablet (1,000 mg total) by mouth 2 (two) times daily with a meal. 60 tablet 3  . nitroGLYCERIN (NITROSTAT) 0.4 MG SL tablet Place 1 tablet (0.4 mg total) under the tongue every 5 (five) minutes as needed for chest pain. 25 tablet 3  . pravastatin (PRAVACHOL) 40 MG tablet Take 1 tablet (40 mg total) by mouth daily. 30 tablet 1  . Testosterone Cypionate 200 MG/ML KIT Inject 25 mg into the muscle once a week.    . vitamin B-12 (CYANOCOBALAMIN) 500 MCG tablet Take 500 mcg by mouth daily.      . fluticasone (FLONASE) 50 MCG/ACT nasal spray Place 2 sprays into both nostrils daily as needed for allergies.     No current facility-administered medications for this visit.     PAST MEDICAL HISTORY: Past Medical History:  Diagnosis Date  . Chest pain   . Coronary artery disease   . Diabetes mellitus   . Heart attack ([place])    approx. 2009  . Hyperlipidemia   . Hypertension   . Malaise and fatigue   .  Osteoarthritis   . Right foot drop   . Right shoulder pain   . Vision abnormalities     PAST SURGICAL HISTORY: Past Surgical History:  Procedure Laterality Date  . 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 KNEE SURGERY      FAMILY HISTORY: Family History  Problem Relation Age of Onset  . Breast cancer Mother   . Liver cancer Father   . Renal cancer Sister     SOCIAL HISTORY:  Social History   Socioeconomic History  . Marital status: Married    Spouse name: Not on file  . Number of children: Not on file  . Years of education: Not on file  . Highest education level: Not on file  Social Needs  .  Financial resource strain: Not on file  . Food insecurity - worry: Not on file  . Food insecurity - inability: Not on file  . Transportation needs - medical: Not on file  . Transportation needs - non-medical: Not on file  Occupational History  . Not on file  Tobacco Use  . Smoking status: Former Research scientist (life sciences)  . Smokeless tobacco: Never Used  . Tobacco comment: quit 4 yrs ago  Substance and Sexual Activity  . Alcohol use: No  . Drug use: No  . Sexual activity: Not on file  Other Topics Concern  . Not on file  Social History Narrative  . Not on file     PHYSICAL EXAM   Vitals:   07/31/17 0728  BP: 125/71  Pulse: 60  Resp: 16  Weight: 163 lb (73.9 kg)  Height: '5\' 7"'$  (1.702 m)    Not recorded      Body mass index is 25.53 kg/m.  PHYSICAL EXAMNIATION:  Gen: NAD, conversant, well nourised, obese, well groomed                     Cardiovascular: Regular rate rhythm, no peripheral edema, warm, nontender. Eyes: Conjunctivae clear without exudates or hemorrhage Neck: Supple, no carotid bruits. Pulmonary: Clear to auscultation bilaterally   NEUROLOGICAL EXAM:  MENTAL STATUS: Speech:    Speech is normal; fluent and spontaneous with normal comprehension.  Cognition:     Orientation to time, place and person     Normal recent and remote memory     Normal Attention span and concentration     Normal Language, naming, repeating,spontaneous speech     Fund of knowledge   CRANIAL NERVES: CN II: Visual fields are full to confrontation. Fundoscopic exam is normal with sharp discs and no vascular changes. Pupils are round equal and briskly reactive to light. CN III, IV, VI: extraocular movement are normal. No ptosis. CN V: Facial sensation is intact to pinprick in all 3 divisions bilaterally. Corneal responses are intact.  CN VII: Face is symmetric with normal eye closure and smile. CN VIII: Hearing is normal to rubbing fingers CN IX, X: Palate elevates symmetrically. Phonation  is normal. CN XI: Head turning and shoulder shrug are intact CN XII: Tongue is midline with normal movements and no atrophy.  MOTOR: There is no pronator drift of out-stretched arms. Muscle bulk and tone are normal. Muscle strength is normal.  REFLEXES: Reflexes are 2+ and symmetric at the biceps, triceps, knees, and ankles. Plantar responses are flexor.  SENSORY: Intact to light touch, pinprick, positional sensation and vibratory sensation are intact in fingers and toes.  COORDINATION: Rapid alternating movements and fine finger movements  are intact. There is no dysmetria on finger-to-nose and heel-knee-shin.    GAIT/STANCE: Posture is normal. Gait is steady with normal steps, base, arm swing, and turning. Heel and toe walking are normal. Tandem gait is normal.  Romberg is absent.   DIAGNOSTIC DATA (LABS, IMAGING, TESTING) - I reviewed patient records, labs, notes, testing and imaging myself where available.   ASSESSMENT AND PLAN  ARGENIS KUMARI is a 67 y.o. male    Acute onset of transient left facial numbness  He does have multiple vascular risk factors, hypertension, hyperlipidemia, poorly controlled diabetes, previously smoker,  Aspirin 325 mg daily  Goal A1C less than 7, LDL less than 70  Advised him continue moderate exercise, keep well hydration,    Marcial Pacas, M.D. Ph.D.  [facility] [address], [place], [place] [address] Ph: [phone] Fax: [phone]  CC: Coy Saunas, MD

## 2017-07-31 NOTE — Patient Instructions (Signed)
Contact [address] [place], [place]: [phone] Fax: [phone]  Now taking new patients! Services Endocrinology Providers Renato Shin, MD  Philemon Kingdom, MD  Elayne Snare, MD

## 2017-08-19 LAB — HM DIABETES EYE EXAM

## 2017-09-06 ENCOUNTER — Other Ambulatory Visit: Payer: Self-pay

## 2017-09-12 ENCOUNTER — Encounter: Payer: Self-pay | Admitting: Endocrinology

## 2017-09-12 ENCOUNTER — Ambulatory Visit: Payer: BC Managed Care – PPO | Admitting: Endocrinology

## 2017-09-12 VITALS — BP 128/60 | HR 59 | Temp 97.7°F | Ht 67.0 in | Wt 162.6 lb

## 2017-09-12 DIAGNOSIS — E119 Type 2 diabetes mellitus without complications: Secondary | ICD-10-CM

## 2017-09-12 LAB — POCT GLYCOSYLATED HEMOGLOBIN (HGB A1C): Hemoglobin A1C: 7.6

## 2017-09-12 MED ORDER — SITAGLIPTIN PHOSPHATE 100 MG PO TABS: 100.0000 mg | ORAL_TABLET | Freq: Every day | ORAL | 3 refills | Status: DC

## 2017-09-12 NOTE — Progress Notes (Signed)
Subjective:    Patient ID: Jason Bailey, male    DOB: [birth year], 67 y.o.   MRN: [id number]  HPI pt is referred by Dr Rosana Hoes, for diabetes.  Pt states DM was dx'ed in 1994; he has mild if any neuropathy of the lower extremities; he has associated CAD and TIA; he has never been on insulin; pt says his diet and exercise are good; he has never had pancreatitis, pancreatic surgery, severe hypoglycemia or DKA.  He takes 2 oral meds, with no recent change.  He says cbg's are well-controlled Past Medical History:  Diagnosis Date  . Chest pain   . Coronary artery disease   . Diabetes mellitus   . Heart attack ([place])    approx. 2009  . Hyperlipidemia   . Hypertension   . Malaise and fatigue   . Osteoarthritis   . Right foot drop   . Right shoulder pain   . Vision abnormalities     Past Surgical History:  Procedure Laterality Date  . 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 KNEE SURGERY      Social History   Socioeconomic History  . Marital status: Married    Spouse name: Not on file  . Number of children: Not on file  . Years of education: Not on file  . Highest education level: Not on file  Occupational History  . Not on file  Social Needs  . Financial resource strain: Not on file  . Food insecurity:    Worry: Not on file    Inability: Not on file  . Transportation needs:    Medical: Not on file    Non-medical: Not on file  Tobacco Use  . Smoking status: Former Research scientist (life sciences)  . Smokeless tobacco: Never Used  . Tobacco comment: quit 4 yrs ago  Substance and Sexual Activity  . Alcohol use: No  . Drug use: No  . Sexual activity: Not on file  Lifestyle  . Physical activity:    Days per week: Not on file    Minutes per session: Not on file  . Stress: Not on file  Relationships  . Social connections:    Talks on phone: Not on file    Gets together: Not on file    Attends religious service: Not on file    Active member of club or organization: Not on file   Attends meetings of clubs or organizations: Not on file    Relationship status: Not on file  . Intimate partner violence:    Fear of current or ex partner: Not on file    Emotionally abused: Not on file    Physically abused: Not on file    Forced sexual activity: Not on file  Other Topics Concern  . Not on file  Social History Narrative  . Not on file    Current Outpatient Medications on File Prior to Visit  Medication Sig Dispense Refill  . aspirin 325 MG tablet Take 1 tablet (325 mg total) by mouth daily. 30 tablet 3  . cholecalciferol (VITAMIN D) 1000 units tablet Take 1,000 Units by mouth daily.    . citalopram (CELEXA) 10 MG tablet Take 5 mg by mouth daily.    . dapagliflozin propanediol (FARXIGA) 5 MG TABS tablet Take 2.5 mg by mouth daily.    . fluticasone (FLONASE) 50 MCG/ACT nasal spray Place 2 sprays into both nostrils daily as needed for allergies.    Marland Kitchen lisinopril (  PRINIVIL,ZESTRIL) 10 MG tablet Take 5 mg by mouth daily.     . metFORMIN (GLUCOPHAGE) 1000 MG tablet Take 1 tablet (1,000 mg total) by mouth 2 (two) times daily with a meal. 60 tablet 3  . nitroGLYCERIN (NITROSTAT) 0.4 MG SL tablet Place 1 tablet (0.4 mg total) under the tongue every 5 (five) minutes as needed for chest pain. 25 tablet 3  . pravastatin (PRAVACHOL) 40 MG tablet Take 1 tablet (40 mg total) by mouth daily. 30 tablet 1  . Testosterone Cypionate 200 MG/ML KIT Inject 25 mg into the muscle once a week.    . vitamin B-12 (CYANOCOBALAMIN) 500 MCG tablet Take 500 mcg by mouth daily.       No current facility-administered medications on file prior to visit.     No Known Allergies  Family History  Problem Relation Age of Onset  . Breast cancer Mother   . Liver cancer Father   . Renal cancer Sister   . Diabetes Maternal Grandfather     BP 128/60 (BP Location: Left Arm, Patient Position: Sitting, Cuff Size: Normal)   Pulse (!) 59   Temp 97.7 F (36.5 C) (Oral)   Ht _0  (1.702 m)   Wt 162 lb 9.6  oz (73.8 kg)   SpO2 97%   BMI 25.47 kg/m     Review of Systems denies weight loss, blurry vision, headache, chest pain, sob, n/v, urinary frequency, muscle cramps, dry skin, memory loss, depression, cold intolerance, rhinorrhea, and easy bruising.       Objective:   Physical Exam VS: see vs page GEN: no distress HEAD: head: no deformity eyes: no periorbital swelling, no proptosis external nose and ears are normal mouth: no lesion seen NECK: supple, thyroid is not enlarged CHEST WALL: no deformity LUNGS: clear to auscultation CV: reg rate and rhythm, no murmur ABD: abdomen is soft, nontender.  no hepatosplenomegaly.  not distended.  no hernia MUSCULOSKELETAL: muscle bulk and strength are grossly normal.  no obvious joint swelling.  gait is normal and steady EXTEMITIES: no deformity.  no ulcer on the feet.  feet are of normal color and temp.  no edema PULSES: dorsalis pedis intact bilat.  no carotid bruit NEURO:  cn 2-12 grossly intact.   readily moves all 4's.  sensation is intact to touch on the feet SKIN:  Normal texture and temperature.  No rash or suspicious lesion is visible.   NODES:  None palpable at the neck PSYCH: alert, well-oriented.  Does not appear anxious nor depressed.   A1c=7.6%  Pt signs release of info today, for past test results     Assessment & Plan:  Type 2 DM, with TIA: he needs increased rx Lean body habitus: he may be evolving type 1.  He should learn about insulin, just in case.   Patient Instructions  good diet and exercise significantly improve the control of your diabetes.  please let me know if you wish to be referred to a dietician.  high blood sugar is very risky to your health.  you should see an eye doctor and dentist every year.  It is very important to get all recommended vaccinations.  Controlling your blood pressure and cholesterol drastically reduces the damage diabetes does to your body.  Those who smoke should quit.  Please discuss  these with your doctor.  check your blood sugar once a day.  vary the time of day when you check, between before the 3 meals, and at bedtime.  also check if you have symptoms of your blood sugar being too high or too low.  please keep a record of the readings and bring it to your next appointment here (or you can bring the meter itself).  You can write it on any piece of paper.  please call us sooner if your blood sugar goes below 70, or if you have a lot of readings over 200.   I have sent a prescription to your pharmacy, to add "[place]."  Please come back for a follow-up appointment in 2 months.  Please see Vaughan Basta the same day, to learn about insulin.

## 2017-09-12 NOTE — Patient Instructions (Addendum)
good diet and exercise significantly improve the control of your diabetes.  please let me know if you wish to be referred to a dietician.  high blood sugar is very risky to your health.  you should see an eye doctor and dentist every year.  It is very important to get all recommended vaccinations.  Controlling your blood pressure and cholesterol drastically reduces the damage diabetes does to your body.  Those who smoke should quit.  Please discuss these with your doctor.  check your blood sugar once a day.  vary the time of day when you check, between before the 3 meals, and at bedtime.  also check if you have symptoms of your blood sugar being too high or too low.  please keep a record of the readings and bring it to your next appointment here (or you can bring the meter itself).  You can write it on any piece of paper.  please call us sooner if your blood sugar goes below 70, or if you have a lot of readings over 200.   I have sent a prescription to your pharmacy, to add "[place]."  Please come back for a follow-up appointment in 2 months.  Please see Vaughan Basta the same day, to learn about insulin.

## 2017-11-12 ENCOUNTER — Encounter: Payer: Self-pay | Admitting: Endocrinology

## 2017-11-12 ENCOUNTER — Encounter: Payer: Medicare Other | Attending: Endocrinology | Admitting: Nutrition

## 2017-11-12 ENCOUNTER — Ambulatory Visit (INDEPENDENT_AMBULATORY_CARE_PROVIDER_SITE_OTHER): Payer: Medicare Other | Admitting: Endocrinology

## 2017-11-12 ENCOUNTER — Ambulatory Visit: Payer: BC Managed Care – PPO | Admitting: Endocrinology

## 2017-11-12 VITALS — BP 142/62 | HR 60 | Wt 161.6 lb

## 2017-11-12 DIAGNOSIS — E119 Type 2 diabetes mellitus without complications: Secondary | ICD-10-CM | POA: Diagnosis not present

## 2017-11-12 DIAGNOSIS — Z713 Dietary counseling and surveillance: Secondary | ICD-10-CM | POA: Diagnosis not present

## 2017-11-12 LAB — POCT GLYCOSYLATED HEMOGLOBIN (HGB A1C): Hemoglobin A1C: 7.9 % — AB (ref 4.0–5.6)

## 2017-11-12 MED ORDER — DAPAGLIFLOZIN PROPANEDIOL 5 MG PO TABS: 5.0000 mg | ORAL_TABLET | Freq: Every day | ORAL | 11 refills | Status: DC

## 2017-11-12 NOTE — Patient Instructions (Signed)
Stop sweet drinks Test blood sugars before one meal, and then 2 hours after eating.  Goal for this is less than 170. Take insulin as directed when Dr. Loanne Drilling orders this for you.

## 2017-11-12 NOTE — Patient Instructions (Addendum)
Your blood pressure is high today.  Please see your primary care provider soon, to have it rechecked check your blood sugar once a day.  vary the time of day when you check, between before the 3 meals, and at bedtime.  also check if you have symptoms of your blood sugar being too high or too low.  please keep a record of the readings and bring it to your next appointment here (or you can bring the meter itself).  You can write it on any piece of paper.  please call us sooner if your blood sugar goes below 70, or if you have a lot of readings over 200.   I have sent a prescription to your pharmacy, to double the [place].   Please come back for a follow-up appointment in 3-4 months.

## 2017-11-12 NOTE — Progress Notes (Signed)
Pt. Is here with his wife.  He did not bring his meter.  Says is testing "a couple times a week, and it is 1800s-190 2 hr. After meal.   Typical day: 5AM up 6AM Bfast of a [employer] breakfast bar and 8 ounces of Dr. Darrell Jewel. 10AM: Nbs, or another bar with Peanut butter, and water to drink **drinks a can of pepsi all afternoon.   2PM: lunch -3 days/wk.  Sandwich or sub sandwich  And water to drink 6-7PM: Dinner:  4-5 ounces protein, 2 carb servings of starch, and one non starchy veg.  No bread, water ususally to drink, sometimes sweet tea. 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    Discussed the need to stop the sweet drinks, but says he can not.  Discussed other ways to slow the blood sugar down, by adding nuts to this, which he says he already does.    We discussed insulin administration, and was shown how/where to inject, how to store the insulin and how to rotate sites. We also discussed low blood sugars--symptoms andf treatment, and he reported good understanding of this with no final questions.   He was also encouraged to do blood sugar readings before and 2 hours after one meal, to see if the readings are coming down after eating.  Goals for these readings were given to him.

## 2017-11-12 NOTE — Progress Notes (Signed)
Subjective:    Patient ID: Jason Bailey, male    DOB: 1951/04/27, 67 y.o.   MRN: [id number]  HPI Pt returns for f/u of diabetes mellitus:  DM type: 2 (but lean body habitus suggests he may be evolving type 1) Dx'ed: 1027 Complications: CAD and TIA Therapy: 3 oral meds.   DKA: never Severe hypoglycemia: never Pancreatitis: never Pancreatic imaging:  Other: he has never been on insulin, but he has learned about insulin injections, just in case Interval history: pt states he feels well in general.  He takes meds as rx'ed Past Medical History:  Diagnosis Date  . Chest pain   . Coronary artery disease   . Diabetes mellitus   . Heart attack ([place])    approx. 2009  . Hyperlipidemia   . Hypertension   . Malaise and fatigue   . Osteoarthritis   . Right foot drop   . Right shoulder pain   . Vision abnormalities     Past Surgical History:  Procedure Laterality Date  . 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 KNEE SURGERY      Social History   Socioeconomic History  . Marital status: Married    Spouse name: Not on file  . Number of children: Not on file  . Years of education: Not on file  . Highest education level: Not on file  Occupational History  . Not on file  Social Needs  . Financial resource strain: Not on file  . Food insecurity:    Worry: Not on file    Inability: Not on file  . Transportation needs:    Medical: Not on file    Non-medical: Not on file  Tobacco Use  . Smoking status: Former Research scientist (life sciences)  . Smokeless tobacco: Never Used  . Tobacco comment: quit 4 yrs ago  Substance and Sexual Activity  . Alcohol use: No  . Drug use: No  . Sexual activity: Not on file  Lifestyle  . Physical activity:    Days per week: Not on file    Minutes per session: Not on file  . Stress: Not on file  Relationships  . Social connections:    Talks on phone: Not on file    Gets together: Not on file    Attends religious service: Not on file    Active  member of club or organization: Not on file    Attends meetings of clubs or organizations: Not on file    Relationship status: Not on file  . Intimate partner violence:    Fear of current or ex partner: Not on file    Emotionally abused: Not on file    Physically abused: Not on file    Forced sexual activity: Not on file  Other Topics Concern  . Not on file  Social History Narrative  . Not on file    Current Outpatient Medications on File Prior to Visit  Medication Sig Dispense Refill  . aspirin 325 MG tablet Take 1 tablet (325 mg total) by mouth daily. 30 tablet 3  . cholecalciferol (VITAMIN D) 1000 units tablet Take 1,000 Units by mouth daily.    . citalopram (CELEXA) 10 MG tablet Take 5 mg by mouth daily.    . fluticasone (FLONASE) 50 MCG/ACT nasal spray Place 2 sprays into both nostrils daily as needed for allergies.    Marland Kitchen lisinopril (PRINIVIL,ZESTRIL) 10 MG tablet Take 5 mg by mouth daily.     Marland Kitchen  nitroGLYCERIN (NITROSTAT) 0.4 MG SL tablet Place 1 tablet (0.4 mg total) under the tongue every 5 (five) minutes as needed for chest pain. 25 tablet 3  . pravastatin (PRAVACHOL) 40 MG tablet Take 1 tablet (40 mg total) by mouth daily. 30 tablet 1  . sitaGLIPtin (JANUVIA) 100 MG tablet Take 1 tablet (100 mg total) by mouth daily. 90 tablet 3  . Testosterone Cypionate 200 MG/ML KIT Inject 25 mg into the muscle once a week.    . vitamin B-12 (CYANOCOBALAMIN) 500 MCG tablet Take 500 mcg by mouth daily.      . metFORMIN (GLUCOPHAGE) 1000 MG tablet Take 1 tablet (1,000 mg total) by mouth 2 (two) times daily with a meal. 60 tablet 3   No current facility-administered medications on file prior to visit.     No Known Allergies  Family History  Problem Relation Age of Onset  . Breast cancer Mother   . Liver cancer Father   . Renal cancer Sister   . Diabetes Maternal Grandfather     BP (!) 142/62   Pulse 60   Wt 161 lb 9.6 oz (73.3 kg)   SpO2 97%   BMI 25.31 kg/m    Review of  Systems He denies hypoglycemia     Objective:   Physical Exam VITAL SIGNS:  See vs page GENERAL: no distress Pulses: foot pulses are intact bilaterally.   MSK: no deformity of the feet or ankles.  CV: no edema of the legs or ankles Skin:  no ulcer on the feet or ankles.  normal color and temp on the feet and ankles Neuro: sensation is intact to touch on the feet and ankles.     Lab Results  Component Value Date   HGBA1C 7.9 (A) 11/12/2017   Lab Results  Component Value Date   CREATININE 0.58 (L) 06/13/2017   BUN 11 06/13/2017   NA 136 06/13/2017   K 3.9 06/13/2017   CL 101 06/13/2017   CO2 26 06/13/2017       Assessment & Plan:  Type 2 DM: worse.  HTN: is noted today.  Patient Instructions  Your blood pressure is high today.  Please see your primary care provider soon, to have it rechecked check your blood sugar once a day.  vary the time of day when you check, between before the 3 meals, and at bedtime.  also check if you have symptoms of your blood sugar being too high or too low.  please keep a record of the readings and bring it to your next appointment here (or you can bring the meter itself).  You can write it on any piece of paper.  please call us sooner if your blood sugar goes below 70, or if you have a lot of readings over 200.   I have sent a prescription to your pharmacy, to double the [place].   Please come back for a follow-up appointment in 3-4 months.

## 2017-11-15 ENCOUNTER — Other Ambulatory Visit: Payer: Self-pay

## 2017-11-15 MED ORDER — METFORMIN HCL 1000 MG PO TABS: 1000.0000 mg | ORAL_TABLET | Freq: Two times a day (BID) | ORAL | 3 refills | Status: DC

## 2017-11-22 ENCOUNTER — Other Ambulatory Visit: Payer: Self-pay | Admitting: Endocrinology

## 2017-12-20 ENCOUNTER — Telehealth: Payer: Self-pay | Admitting: Endocrinology

## 2017-12-20 NOTE — Telephone Encounter (Signed)
I need to know what alternative is.  Thank you.

## 2017-12-20 NOTE — Telephone Encounter (Signed)
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

## 2017-12-20 NOTE — Telephone Encounter (Signed)
Per [facility]-caller (patient) states that he went to go get his medication filled and it is not covered by insurance anymore because he has switched to [employer]

## 2017-12-23 MED ORDER — EMPAGLIFLOZIN 10 MG PO TABS: 10.0000 mg | ORAL_TABLET | Freq: Every day | ORAL | 3 refills | Status: DC

## 2017-12-23 NOTE — Telephone Encounter (Signed)
Ok, I have sent a prescription to your pharmacy, to change to jardiance 

## 2017-12-23 NOTE — Telephone Encounter (Signed)
lft vm for pt to call back with that info

## 2017-12-23 NOTE — Telephone Encounter (Signed)
Pt called and stated that invokana,jardiance, glipizide and glimepiride were all listed a being compatiable. Pt would like whichever one is most comparable to [place]

## 2017-12-24 ENCOUNTER — Other Ambulatory Visit: Payer: Self-pay | Admitting: Endocrinology

## 2017-12-24 NOTE — Telephone Encounter (Signed)
lft vm informing pt of change

## 2018-01-22 ENCOUNTER — Other Ambulatory Visit: Payer: Self-pay | Admitting: Endocrinology

## 2018-02-03 ENCOUNTER — Other Ambulatory Visit: Payer: Self-pay | Admitting: Endocrinology

## 2018-03-18 ENCOUNTER — Ambulatory Visit: Payer: Medicare Other | Admitting: Endocrinology

## 2018-03-18 ENCOUNTER — Encounter: Payer: Self-pay | Admitting: Endocrinology

## 2018-03-18 VITALS — BP 124/72 | HR 76 | Ht 67.0 in | Wt 164.2 lb

## 2018-03-18 DIAGNOSIS — E119 Type 2 diabetes mellitus without complications: Secondary | ICD-10-CM

## 2018-03-18 LAB — POCT GLYCOSYLATED HEMOGLOBIN (HGB A1C): HEMOGLOBIN A1C: 7.8 % — AB (ref 4.0–5.6)

## 2018-03-18 MED ORDER — REPAGLINIDE 0.5 MG PO TABS: 0.5000 mg | ORAL_TABLET | Freq: Every day | ORAL | 3 refills | Status: DC

## 2018-03-18 NOTE — Patient Instructions (Addendum)
check your blood sugar once a day.  vary the time of day when you check, between before the 3 meals, and at bedtime.  also check if you have symptoms of your blood sugar being too high or too low.  please keep a record of the readings and bring it to your next appointment here (or you can bring the meter itself).  You can write it on any piece of paper.  please call us sooner if your blood sugar goes below 70, or if you have a lot of readings over 200.   I have sent a prescription to your pharmacy, to add "repaglinide."  Please come back for a follow-up appointment in 3-4 months.

## 2018-03-18 NOTE — Progress Notes (Signed)
Subjective:    Patient ID: Jason Bailey, male    DOB: Dec 08, 1950, 67 y.o.   MRN: [id number]  HPI Pt returns for f/u of diabetes mellitus:  DM type: 2 (but lean body habitus suggests he may be evolving type 1) Dx'ed: 4098 Complications: CAD and TIA Therapy: 3 oral meds.   DKA: never Severe hypoglycemia: never Pancreatitis: never Pancreatic imaging: never Other: he has never been on insulin, but he has learned about insulin injections, just in case Interval history: pt states he feels well in general.  He takes meds as rx'ed.  He says cbg varies from 100-230.   Past Medical History:  Diagnosis Date  . Chest pain   . Coronary artery disease   . Diabetes mellitus   . Heart attack ([place])    approx. 2009  . Hyperlipidemia   . Hypertension   . Malaise and fatigue   . Osteoarthritis   . Right foot drop   . Right shoulder pain   . Vision abnormalities     Past Surgical History:  Procedure Laterality Date  . 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 KNEE SURGERY      Social History   Socioeconomic History  . Marital status: Married    Spouse name: Not on file  . Number of children: Not on file  . Years of education: Not on file  . Highest education level: Not on file  Occupational History  . Not on file  Social Needs  . Financial resource strain: Not on file  . Food insecurity:    Worry: Not on file    Inability: Not on file  . Transportation needs:    Medical: Not on file    Non-medical: Not on file  Tobacco Use  . Smoking status: Former Research scientist (life sciences)  . Smokeless tobacco: Never Used  . Tobacco comment: quit 4 yrs ago  Substance and Sexual Activity  . Alcohol use: No  . Drug use: No  . Sexual activity: Not on file  Lifestyle  . Physical activity:    Days per week: Not on file    Minutes per session: Not on file  . Stress: Not on file  Relationships  . Social connections:    Talks on phone: Not on file    Gets together: Not on file    Attends  religious service: Not on file    Active member of club or organization: Not on file    Attends meetings of clubs or organizations: Not on file    Relationship status: Not on file  . Intimate partner violence:    Fear of current or ex partner: Not on file    Emotionally abused: Not on file    Physically abused: Not on file    Forced sexual activity: Not on file  Other Topics Concern  . Not on file  Social History Narrative  . Not on file    Current Outpatient Medications on File Prior to Visit  Medication Sig Dispense Refill  . aspirin 325 MG tablet Take 1 tablet (325 mg total) by mouth daily. 30 tablet 3  . cholecalciferol (VITAMIN D) 1000 units tablet Take 1,000 Units by mouth daily.    . citalopram (CELEXA) 10 MG tablet Take 5 mg by mouth daily.    . empagliflozin (JARDIANCE) 10 MG TABS tablet Take 10 mg by mouth daily. 90 tablet 3  . fluticasone (FLONASE) 50 MCG/ACT nasal spray Place 2  sprays into both nostrils daily as needed for allergies.    Marland Kitchen lisinopril (PRINIVIL,ZESTRIL) 10 MG tablet Take 5 mg by mouth daily.     . metFORMIN (GLUCOPHAGE-XR) 500 MG 24 hr tablet TAKE TWO TABLETS TWICE DAILY FOR DIABETES 120 tablet 0  . nitroGLYCERIN (NITROSTAT) 0.4 MG SL tablet Place 1 tablet (0.4 mg total) under the tongue every 5 (five) minutes as needed for chest pain. 25 tablet 3  . pravastatin (PRAVACHOL) 40 MG tablet Take 1 tablet (40 mg total) by mouth daily. 30 tablet 1  . sitaGLIPtin (JANUVIA) 100 MG tablet Take 1 tablet (100 mg total) by mouth daily. 90 tablet 3  . Testosterone Cypionate 200 MG/ML KIT Inject 25 mg into the muscle once a week.    . vitamin B-12 (CYANOCOBALAMIN) 500 MCG tablet Take 500 mcg by mouth daily.       No current facility-administered medications on file prior to visit.     No Known Allergies  Family History  Problem Relation Age of Onset  . Breast cancer Mother   . Liver cancer Father   . Renal cancer Sister   . Diabetes Maternal Grandfather     BP  124/72   Pulse 76   Ht 5' 7" (1.702 m)   Wt 164 lb 3.2 oz (74.5 kg)   SpO2 98%   BMI 25.72 kg/m    Review of Systems He denies hypoglycemia.      Objective:   Physical Exam VITAL SIGNS:  See vs page GENERAL: no distress Pulses: dorsalis pedis intact bilat.   MSK: no deformity of the feet CV: no leg edema Skin:  no ulcer on the feet.  normal color and temp on the feet. Neuro: sensation is intact to touch on the feet.    Lab Results  Component Value Date   HGBA1C 7.8 (A) 03/18/2018   Lab Results  Component Value Date   CREATININE 0.58 (L) 06/13/2017   BUN 11 06/13/2017   NA 136 06/13/2017   K 3.9 06/13/2017   CL 101 06/13/2017   CO2 26 06/13/2017       Assessment & Plan:  Type 2 DM: well-controlled Lean body habitus; we discussed risk of evolving type 1.  We'll follow.   Patient Instructions  check your blood sugar once a day.  vary the time of day when you check, between before the 3 meals, and at bedtime.  also check if you have symptoms of your blood sugar being too high or too low.  please keep a record of the readings and bring it to your next appointment here (or you can bring the meter itself).  You can write it on any piece of paper.  please call us sooner if your blood sugar goes below 70, or if you have a lot of readings over 200.   I have sent a prescription to your pharmacy, to add "repaglinide."  Please come back for a follow-up appointment in 3-4 months.

## 2018-03-26 ENCOUNTER — Other Ambulatory Visit: Payer: Self-pay | Admitting: Endocrinology

## 2018-04-28 ENCOUNTER — Other Ambulatory Visit: Payer: Self-pay | Admitting: Endocrinology

## 2018-06-12 ENCOUNTER — Other Ambulatory Visit: Payer: Self-pay | Admitting: Endocrinology

## 2018-06-24 ENCOUNTER — Encounter: Payer: Self-pay | Admitting: Endocrinology

## 2018-06-24 ENCOUNTER — Ambulatory Visit: Payer: Medicare Other | Admitting: Endocrinology

## 2018-06-24 VITALS — BP 126/64 | HR 79 | Ht 67.0 in | Wt 165.0 lb

## 2018-06-24 DIAGNOSIS — E119 Type 2 diabetes mellitus without complications: Secondary | ICD-10-CM

## 2018-06-24 LAB — POCT GLYCOSYLATED HEMOGLOBIN (HGB A1C): Hemoglobin A1C: 7.6 % — AB (ref 4.0–5.6)

## 2018-06-24 MED ORDER — REPAGLINIDE 0.5 MG PO TABS: 0.2500 mg | ORAL_TABLET | Freq: Two times a day (BID) | ORAL | 3 refills | Status: DC

## 2018-06-24 NOTE — Progress Notes (Signed)
Subjective:    Patient ID: Jason Bailey, male    DOB: November 25, 1950, 68 y.o.   MRN: [id number]  HPI Pt returns for f/u of diabetes mellitus:  DM type: 2 (but lean body habitus suggests he may be evolving type 1) Dx'ed: 4481 Complications: CAD and TIA Therapy: 4 oral meds.   DKA: never Severe hypoglycemia: never Pancreatitis: never Pancreatic imaging: never Other: he has never been on insulin, but he has learned about insulin injections, just in case Interval history: pt states he feels well in general, except for nausea at night.  He sometimes skips the repaglinide, because it causes mild hypoglycemia.   Past Medical History:  Diagnosis Date  . Chest pain   . Coronary artery disease   . Diabetes mellitus   . Heart attack ([place])    approx. 2009  . Hyperlipidemia   . Hypertension   . Malaise and fatigue   . Osteoarthritis   . Right foot drop   . Right shoulder pain   . Vision abnormalities     Past Surgical History:  Procedure Laterality Date  . 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 KNEE SURGERY      Social History   Socioeconomic History  . Marital status: Married    Spouse name: Not on file  . Number of children: Not on file  . Years of education: Not on file  . Highest education level: Not on file  Occupational History  . Not on file  Social Needs  . Financial resource strain: Not on file  . Food insecurity:    Worry: Not on file    Inability: Not on file  . Transportation needs:    Medical: Not on file    Non-medical: Not on file  Tobacco Use  . Smoking status: Former Research scientist (life sciences)  . Smokeless tobacco: Never Used  . Tobacco comment: quit 4 yrs ago  Substance and Sexual Activity  . Alcohol use: No  . Drug use: No  . Sexual activity: Not on file  Lifestyle  . Physical activity:    Days per week: Not on file    Minutes per session: Not on file  . Stress: Not on file  Relationships  . Social connections:    Talks on phone: Not on file   Gets together: Not on file    Attends religious service: Not on file    Active member of club or organization: Not on file    Attends meetings of clubs or organizations: Not on file    Relationship status: Not on file  . Intimate partner violence:    Fear of current or ex partner: Not on file    Emotionally abused: Not on file    Physically abused: Not on file    Forced sexual activity: Not on file  Other Topics Concern  . Not on file  Social History Narrative  . Not on file    Current Outpatient Medications on File Prior to Visit  Medication Sig Dispense Refill  . aspirin 325 MG tablet Take 1 tablet (325 mg total) by mouth daily. 30 tablet 3  . cholecalciferol (VITAMIN D) 1000 units tablet Take 1,000 Units by mouth daily.    . citalopram (CELEXA) 10 MG tablet Take 5 mg by mouth daily.    . fluticasone (FLONASE) 50 MCG/ACT nasal spray Place 2 sprays into both nostrils daily as needed for allergies.    Marland Kitchen JANUVIA 100 MG  tablet TAKE ONE TABLET BY MOUTH DAILY 90 tablet 3  . JARDIANCE 10 MG TABS tablet TAKE ONE TABLET BY MOUTH DAILY 90 tablet 3  . lisinopril (PRINIVIL,ZESTRIL) 10 MG tablet Take 5 mg by mouth daily.     . metFORMIN (GLUCOPHAGE-XR) 500 MG 24 hr tablet TAKE TWO TABLETS TWICE DAILY FOR DIABETES 120 tablet 0  . nitroGLYCERIN (NITROSTAT) 0.4 MG SL tablet Place 1 tablet (0.4 mg total) under the tongue every 5 (five) minutes as needed for chest pain. 25 tablet 3  . pravastatin (PRAVACHOL) 40 MG tablet Take 1 tablet (40 mg total) by mouth daily. 30 tablet 1  . Testosterone Cypionate 200 MG/ML KIT Inject 25 mg into the muscle once a week.    . vitamin B-12 (CYANOCOBALAMIN) 500 MCG tablet Take 500 mcg by mouth daily.       No current facility-administered medications on file prior to visit.     No Known Allergies  Family History  Problem Relation Age of Onset  . Breast cancer Mother   . Liver cancer Father   . Renal cancer Sister   . Diabetes Maternal Grandfather     BP  126/64 (BP Location: Right Arm, Patient Position: Sitting, Cuff Size: Normal)   Pulse 79   Ht '5\' 7"'$  (1.702 m)   Wt 165 lb (74.8 kg)   SpO2 97%   BMI 25.84 kg/m   Review of Systems He denies hypoglycemia.     Objective:   Physical Exam VITAL SIGNS:  See vs page GENERAL: no distress Pulses: dorsalis pedis intact bilat.   MSK: no deformity of the feet CV: no leg edema Skin:  no ulcer on the feet.  normal color and temp on the feet. Neuro: sensation is intact to touch on the feet.   Lab Results  Component Value Date   HGBA1C 7.6 (A) 06/24/2018   Lab Results  Component Value Date   CREATININE 0.58 (L) 06/13/2017   BUN 11 06/13/2017   NA 136 06/13/2017   K 3.9 06/13/2017   CL 101 06/13/2017   CO2 26 06/13/2017        Assessment & Plan:  Type 2 DM, with CAD: he needs increased rx, if it can be done with a regimen that avoids or minimizes hypoglycemia.  Nausea: pt says due to [place].  Hypoglycemia: this limits aggressiveness of glycemic control.   Patient Instructions  Try taking the januvia in the morning Please change the repaglinide to 1/2 pill, with breakfast, and supper.  check your blood sugar once a day.  vary the time of day when you check, between before the 3 meals, and at bedtime.  also check if you have symptoms of your blood sugar being too high or too low.  please keep a record of the readings and bring it to your next appointment here (or you can bring the meter itself).  You can write it on any piece of paper.  please call us sooner if your blood sugar goes below 70, or if you have a lot of readings over 200.   Please come back for a follow-up appointment in 3 months.

## 2018-06-24 NOTE — Patient Instructions (Addendum)
Try taking the januvia in the morning Please change the repaglinide to 1/2 pill, with breakfast, and supper.  check your blood sugar once a day.  vary the time of day when you check, between before the 3 meals, and at bedtime.  also check if you have symptoms of your blood sugar being too high or too low.  please keep a record of the readings and bring it to your next appointment here (or you can bring the meter itself).  You can write it on any piece of paper.  please call us sooner if your blood sugar goes below 70, or if you have a lot of readings over 200.   Please come back for a follow-up appointment in 3 months.

## 2018-07-14 ENCOUNTER — Other Ambulatory Visit: Payer: Self-pay | Admitting: Endocrinology

## 2018-07-24 ENCOUNTER — Telehealth: Payer: Self-pay

## 2018-07-24 ENCOUNTER — Ambulatory Visit (HOSPITAL_COMMUNITY)
Admission: RE | Admit: 2018-07-24 | Discharge: 2018-07-24 | Disposition: A | Discharge status: Home / Self Care | Payer: Medicare Other | Source: Ambulatory Visit | Attending: Cardiology | Admitting: Cardiology

## 2018-07-24 ENCOUNTER — Ambulatory Visit (HOSPITAL_BASED_OUTPATIENT_CLINIC_OR_DEPARTMENT_OTHER): Payer: Medicare Other

## 2018-07-24 DIAGNOSIS — I10 Essential (primary) hypertension: Secondary | ICD-10-CM

## 2018-07-24 DIAGNOSIS — E785 Hyperlipidemia, unspecified: Secondary | ICD-10-CM | POA: Diagnosis present

## 2018-07-24 DIAGNOSIS — I6523 Occlusion and stenosis of bilateral carotid arteries: Secondary | ICD-10-CM

## 2018-07-24 DIAGNOSIS — I739 Peripheral vascular disease, unspecified: Principal | ICD-10-CM

## 2018-07-24 DIAGNOSIS — I25810 Atherosclerosis of coronary artery bypass graft(s) without angina pectoris: Secondary | ICD-10-CM

## 2018-07-24 DIAGNOSIS — I779 Disorder of arteries and arterioles, unspecified: Secondary | ICD-10-CM

## 2018-07-24 NOTE — Telephone Encounter (Signed)
-----   Message from Josue Hector, MD sent at 07/24/2018  9:57 AM EST ----- Plaque no stenosis f/u carotid duplex in 2 years

## 2018-07-25 ENCOUNTER — Other Ambulatory Visit: Payer: Self-pay | Admitting: Endocrinology

## 2018-08-04 NOTE — Progress Notes (Signed)
Patient ID: Jason Bailey, male   DOB: 1951/05/22, 68 y.o.   MRN: [id number]     68 y.o. history of CABG with Dr Roxan Hockey 2008 Last myovue normal 2009. He has a new endocrine doctor in [place]  His thyroid nodules are stable. He has aright bruit. Active with no SSCP. Compliant with meds. Primary checking cholesterol A1c in 6 range. Has mild calf pain with statin but tolerable.   CABG 2008 [place]  PROCEDURE: Median sternotomy, extracorporeal circulation, coronary  artery bypass grafting x4 (left internal mammary artery to left anterior  descending artery, saphenous vein graft to first diagonal, saphenous  vein graft to obtuse marginal #1, saphenous vein graft to posterior  descending), endoscopic vein harvest, right leg.  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  All other systems reviewed and negative  General: BP 122/78   Pulse 68   Ht '5\' 7"'$  (1.702 m)   Wt 74.6 kg   SpO2 99%   BMI 25.75 kg/m  Affect appropriate Healthy:  appears stated age 89: normal Neck supple with no adenopathy JVP normal left  bruits no thyromegaly Lungs clear with no wheezing and good diaphragmatic motion Heart:  S1/S2 SEM  murmur, no rub, gallop or click PMI normal Abdomen: benighn, BS positve, no  tenderness, no AAA no bruit.  No HSM or HJR Distal pulses intact with no bruits No edema Neuro non-focal Skin warm and dry No muscular weakness    Current Outpatient Medications  Medication Sig Dispense Refill  . aspirin 325 MG tablet Take 1 tablet (325 mg total) by mouth daily. 30 tablet 3  . cholecalciferol (VITAMIN D) 1000 units tablet Take 1,000 Units by mouth daily.    . citalopram (CELEXA) 10 MG tablet Take 5 mg by mouth daily.    . fluticasone (FLONASE) 50 MCG/ACT nasal spray Place 2 sprays into both nostrils daily as needed for allergies.    Marland Kitchen JANUVIA 100 MG tablet TAKE ONE TABLET BY MOUTH DAILY 90 tablet 3  . JARDIANCE 10 MG TABS tablet TAKE ONE TABLET BY MOUTH DAILY 90 tablet 3  . lisinopril (PRINIVIL,ZESTRIL) 10 MG tablet Take 5 mg by mouth daily.     . metFORMIN (GLUCOPHAGE-XR) 500 MG 24 hr tablet TAKE TWO TABLETS TWICE DAILY FOR DIABETES 120 tablet 0  . montelukast (SINGULAIR) 10 MG tablet Take 1 tablet by mouth daily.     . nitroGLYCERIN (NITROSTAT) 0.4 MG SL tablet Place 1 tablet (0.4 mg total) under the tongue every 5 (five) minutes as needed for chest pain. 25 tablet 3  . pravastatin (PRAVACHOL) 40 MG tablet Take 1 tablet (40 mg total) by mouth daily. 30 tablet 1  . repaglinide (PRANDIN) 0.5 MG tablet Take 0.5 tablets (0.25 mg total) by mouth 2 (two) times daily before a meal. 90 tablet  3  . Testosterone Cypionate 200 MG/ML KIT Inject 25 mg into the muscle once a week.    . vitamin B-12 (CYANOCOBALAMIN) 500 MCG tablet Take 500 mcg by mouth daily.       No current facility-administered medications for this visit.     Allergies  Patient has no known allergies.  Electrocardiogram:  08/08/18 SR rate 68 inferior T wave inversions   Assessment and Plan CAD/CABG:  2008  No angina continue medical Rx normal myovue 2009 Chol: labs with primary on statin   Bruit:  1-39% bilateral plaque duplex 09/345 TIA: not embolic no PAF echo, carotids normal ASA f/u neuro DM:   Poorly controlled discussed issues with stroke and DM f/u primary continue metformin and farxiga     Jenkins Rouge

## 2018-08-08 ENCOUNTER — Ambulatory Visit: Payer: Medicare Other | Admitting: Cardiovascular Disease

## 2018-08-08 ENCOUNTER — Encounter: Payer: Self-pay | Admitting: Cardiovascular Disease

## 2018-08-08 VITALS — BP 122/78 | HR 68 | Ht 67.0 in | Wt 164.4 lb

## 2018-08-08 DIAGNOSIS — I25810 Atherosclerosis of coronary artery bypass graft(s) without angina pectoris: Secondary | ICD-10-CM

## 2018-08-08 DIAGNOSIS — I6523 Occlusion and stenosis of bilateral carotid arteries: Secondary | ICD-10-CM | POA: Diagnosis not present

## 2018-08-08 DIAGNOSIS — E785 Hyperlipidemia, unspecified: Secondary | ICD-10-CM

## 2018-08-08 NOTE — Patient Instructions (Addendum)

## 2018-09-17 ENCOUNTER — Other Ambulatory Visit: Payer: Self-pay

## 2018-09-17 DIAGNOSIS — E119 Type 2 diabetes mellitus without complications: Secondary | ICD-10-CM

## 2018-09-17 MED ORDER — METFORMIN HCL ER 500 MG PO TB24: ORAL_TABLET | ORAL | 0 refills | Status: DC

## 2018-10-17 ENCOUNTER — Encounter: Payer: Self-pay | Admitting: Endocrinology

## 2018-10-20 ENCOUNTER — Ambulatory Visit: Payer: Medicare Other | Admitting: Endocrinology

## 2018-10-20 ENCOUNTER — Ambulatory Visit (INDEPENDENT_AMBULATORY_CARE_PROVIDER_SITE_OTHER): Payer: Medicare Other | Admitting: Endocrinology

## 2018-10-20 ENCOUNTER — Other Ambulatory Visit: Payer: Self-pay | Admitting: Endocrinology

## 2018-10-20 DIAGNOSIS — E119 Type 2 diabetes mellitus without complications: Secondary | ICD-10-CM

## 2018-10-20 DIAGNOSIS — T383X5A Adverse effect of insulin and oral hypoglycemic [antidiabetic] drugs, initial encounter: Secondary | ICD-10-CM

## 2018-10-20 DIAGNOSIS — R110 Nausea: Secondary | ICD-10-CM

## 2018-10-20 MED ORDER — SITAGLIPTIN PHOSPHATE 100 MG PO TABS: 50.0000 mg | ORAL_TABLET | Freq: Every day | ORAL | 3 refills | Status: DC

## 2018-10-20 MED ORDER — EMPAGLIFLOZIN 25 MG PO TABS: 12.5000 mg | ORAL_TABLET | Freq: Every day | ORAL | 3 refills | Status: DC

## 2018-10-20 MED ORDER — REPAGLINIDE 0.5 MG PO TABS: 0.5000 mg | ORAL_TABLET | Freq: Two times a day (BID) | ORAL | 3 refills | Status: DC

## 2018-10-20 NOTE — Patient Instructions (Addendum)
Please increase the repaglinide to 1 pill, with breakfast, and supper.  I have sent a prescription to your pharmacy, to change the Jardiance to 1/2 of a stronger pill.  This is usually cheaper.  Please continue the same other diabetes medications.   check your blood sugar once a day.  vary the time of day when you check, between before the 3 meals, and at bedtime.  also check if you have symptoms of your blood sugar being too high or too low.  please keep a record of the readings and bring it to your next appointment here (or you can bring the meter itself).  You can write it on any piece of paper.  please call us sooner if your blood sugar goes below 70, or if you have a lot of readings over 200.   Please come back for a follow-up appointment in 2-3 months.

## 2018-10-20 NOTE — Progress Notes (Signed)
Subjective:    Patient ID: Jason Bailey, male    DOB: 20-Nov-1950, 68 y.o.   MRN: [id number]  HPI telehealth visit today via doxy video visit.  Alternatives to telehealth are presented to this patient, and the patient agrees to the telehealth visit. Pt is advised of the cost of the visit, and agrees to this, also.   Patient is at home, and I am at the office.   Persons attending the telehealth visit: the patient and I Pt returns for f/u of diabetes mellitus:  DM type: 2 (but lean body habitus suggests he may be evolving type 1) Dx'ed: 3546 Complications: CAD and TIA Therapy: 4 oral meds.   DKA: never Severe hypoglycemia: never Pancreatitis: never Pancreatic imaging: never Other: he has never been on insulin, but he has learned about insulin injections, just in case Interval history: pt states he feels well in general, except for slight nausea.  He reduced januvia to 1/2 tab qd, and that helped.  He says cbg varies from 100-225. Past Medical History:  Diagnosis Date  . Chest pain   . Coronary artery disease   . Diabetes mellitus   . Heart attack ([place])    approx. 2009  . Hyperlipidemia   . Hypertension   . Malaise and fatigue   . Osteoarthritis   . Right foot drop   . Right shoulder pain   . Vision abnormalities     Past Surgical History:  Procedure Laterality Date  . 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 KNEE SURGERY      Social History   Socioeconomic History  . Marital status: Married    Spouse name: Not on file  . Number of children: Not on file  . Years of education: Not on file  . Highest education level: Not on file  Occupational History  . Not on file  Social Needs  . Financial resource strain: Not on file  . Food insecurity:    Worry: Not on file    Inability: Not on file  . Transportation needs:    Medical: Not on file    Non-medical: Not on file  Tobacco Use  . Smoking status: Former Research scientist (life sciences)  . Smokeless tobacco: Never Used  .  Tobacco comment: quit 4 yrs ago  Substance and Sexual Activity  . Alcohol use: No  . Drug use: No  . Sexual activity: Not on file  Lifestyle  . Physical activity:    Days per week: Not on file    Minutes per session: Not on file  . Stress: Not on file  Relationships  . Social connections:    Talks on phone: Not on file    Gets together: Not on file    Attends religious service: Not on file    Active member of club or organization: Not on file    Attends meetings of clubs or organizations: Not on file    Relationship status: Not on file  . Intimate partner violence:    Fear of current or ex partner: Not on file    Emotionally abused: Not on file    Physically abused: Not on file    Forced sexual activity: Not on file  Other Topics Concern  . Not on file  Social History Narrative  . Not on file    Current Outpatient Medications on File Prior to Visit  Medication Sig Dispense Refill  . aspirin 325 MG tablet Take 1  tablet (325 mg total) by mouth daily. 30 tablet 3  . cholecalciferol (VITAMIN D) 1000 units tablet Take 1,000 Units by mouth daily.    . citalopram (CELEXA) 10 MG tablet Take 5 mg by mouth daily.    . fluticasone (FLONASE) 50 MCG/ACT nasal spray Place 2 sprays into both nostrils daily as needed for allergies.    Marland Kitchen lisinopril (PRINIVIL,ZESTRIL) 10 MG tablet Take 5 mg by mouth daily.     . metFORMIN (GLUCOPHAGE-XR) 500 MG 24 hr tablet TAKE TWO TABLETS TWICE DAILY FOR DIABETES 120 tablet 0  . montelukast (SINGULAIR) 10 MG tablet Take 1 tablet by mouth daily.     . nitroGLYCERIN (NITROSTAT) 0.4 MG SL tablet Place 1 tablet (0.4 mg total) under the tongue every 5 (five) minutes as needed for chest pain. 25 tablet 3  . pravastatin (PRAVACHOL) 40 MG tablet Take 1 tablet (40 mg total) by mouth daily. 30 tablet 1  . Testosterone Cypionate 200 MG/ML KIT Inject 25 mg into the muscle once a week.    . vitamin B-12 (CYANOCOBALAMIN) 500 MCG tablet Take 500 mcg by mouth daily.        No current facility-administered medications on file prior to visit.     No Known Allergies  Family History  Problem Relation Age of Onset  . Breast cancer Mother   . Liver cancer Father   . Renal cancer Sister   . Diabetes Maternal Grandfather      Review of Systems He denies hypoglycemia.      Objective:   Physical Exam    Lab Results  Component Value Date   HGBA1C 7.6 (A) 06/24/2018       Assessment & Plan:  Nausea, due to Januvia: improved with dosage reduction. Type 2 DM: worse: he declines a1c  Patient Instructions  Please increase the repaglinide to 1 pill, with breakfast, and supper.  I have sent a prescription to your pharmacy, to change the Jardiance to 1/2 of a stronger pill.  This is usually cheaper.  Please continue the same other diabetes medications.   check your blood sugar once a day.  vary the time of day when you check, between before the 3 meals, and at bedtime.  also check if you have symptoms of your blood sugar being too high or too low.  please keep a record of the readings and bring it to your next appointment here (or you can bring the meter itself).  You can write it on any piece of paper.  please call us sooner if your blood sugar goes below 70, or if you have a lot of readings over 200.   Please come back for a follow-up appointment in 2-3 months.

## 2018-11-04 ENCOUNTER — Telehealth: Payer: Self-pay

## 2018-11-04 NOTE — Telephone Encounter (Signed)
LOV 10/20/18. Per Dr. Loanne Drilling, f/u appt needed in 2-3 months. LVM on home #. Unable to leave VM on mobile#. VM box full

## 2018-11-22 IMAGING — MR MR MRA HEAD W/O CM
9 of 11 series · 32 of 48 positions shown · non-contrast
Comparison: CT 06/13/2017

CLINICAL DATA: Stroke follow-up

EXAM:
MRI HEAD WITHOUT CONTRAST
MRA HEAD WITHOUT CONTRAST
TECHNIQUE: Multiplanar, multiecho pulse sequences of the brain and surrounding
structures were obtained without intravenous contrast. Angiographic
images of the head were obtained using MRA technique without
contrast.

[Series 3: DWI · axial · 3.0mm · 1.09mm/px · z∈[-76,+68]mm · 7 of 98 slices shown (1 of 4)]
[im 1/98]
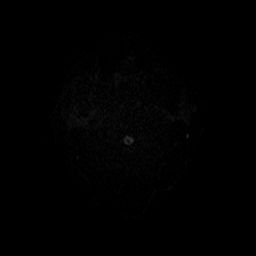
[im 17/98]
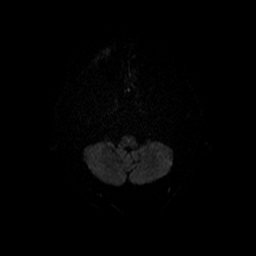
[im 33/98]
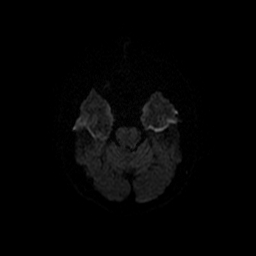
[im 49/98]
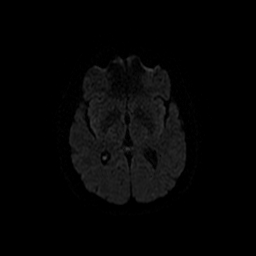
[im 65/98]
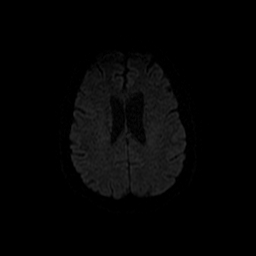
[im 81/98]
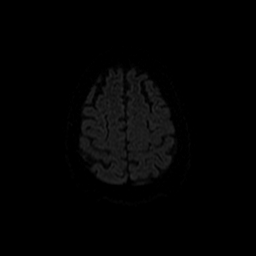
[im 98/98]
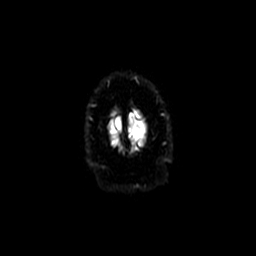

[Series 4: (id) mt fs · axial · 1.4mm · 0.43mm/px · z∈[-88,-25]mm · 5 of 152 slices shown]
[im 1/152]
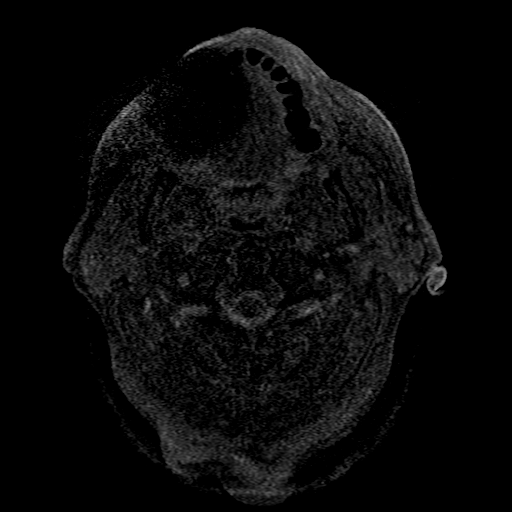
[im 31/152]
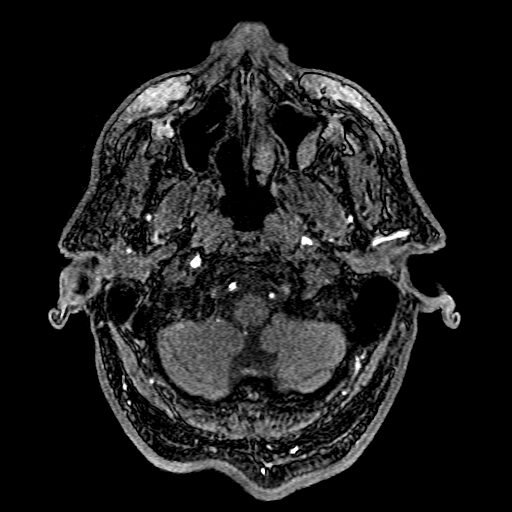
[im 46/152]
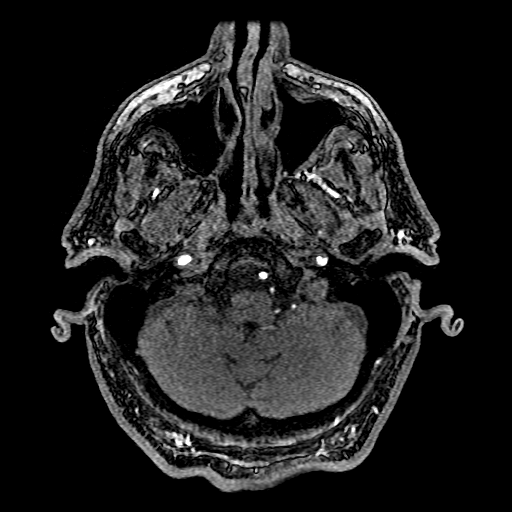
[im 61/152]
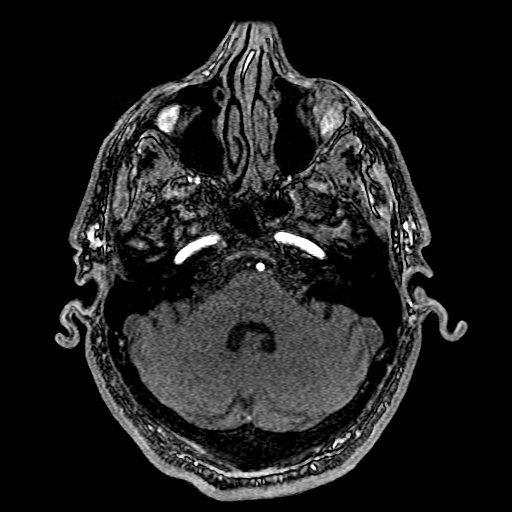
[im 91/152]
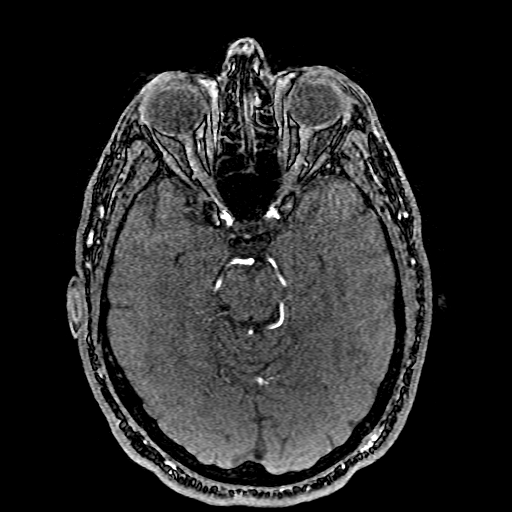

[Series 5: T1 · sagittal · 5.0mm · 0.47mm/px · 2 of 23 slices shown]
[im 1/23]
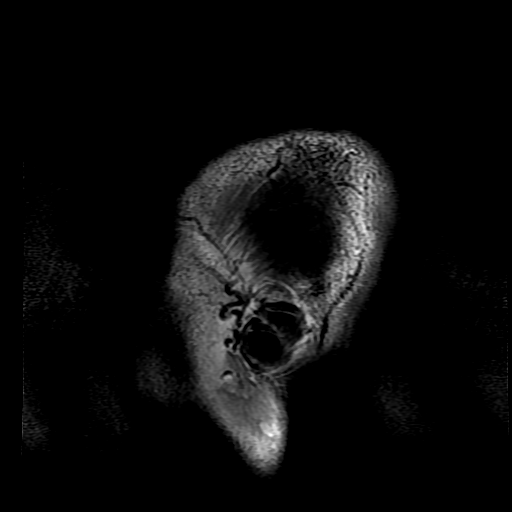
[im 23/23]
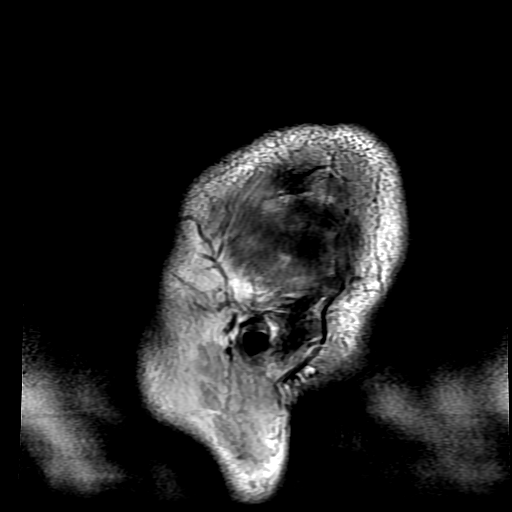

[Series 6: T2 · axial · 5.0mm · 0.43mm/px · z∈[-76,+68]mm · 2 of 25 slices shown (1 of 2)]
[im 1/25]
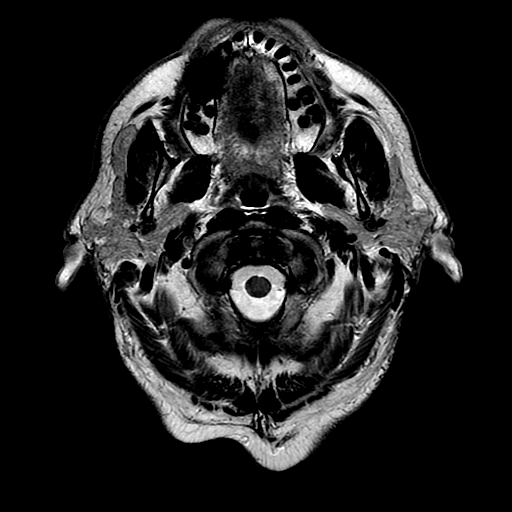
[im 25/25]
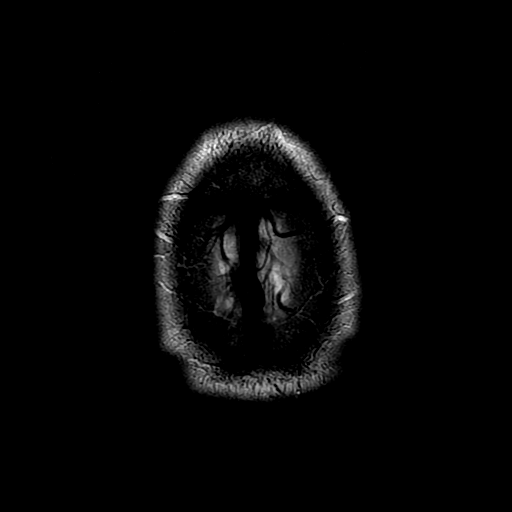

[Series 7: DWI · coronal · 5.0mm · 1.09mm/px · 5 of 66 slices shown (2 of 4)]
[im 1/66]
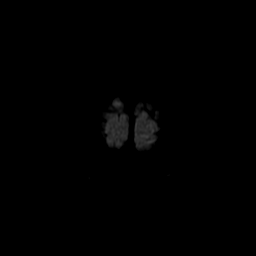
[im 17/66]
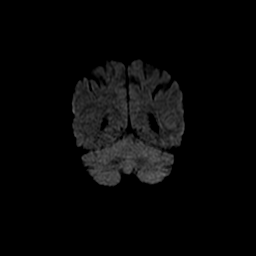
[im 33/66]
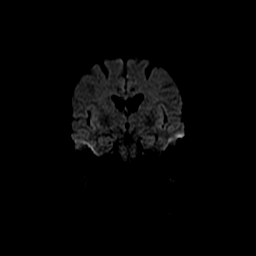
[im 49/66]
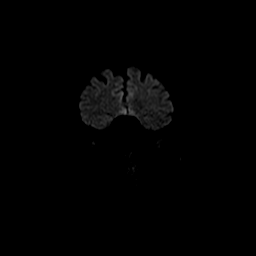
[im 66/66]
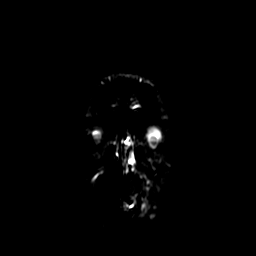

[Series 8: FLAIR · axial · 5.0mm · 0.43mm/px · z∈[-76,+68]mm · 2 of 25 slices shown]
[im 1/25]
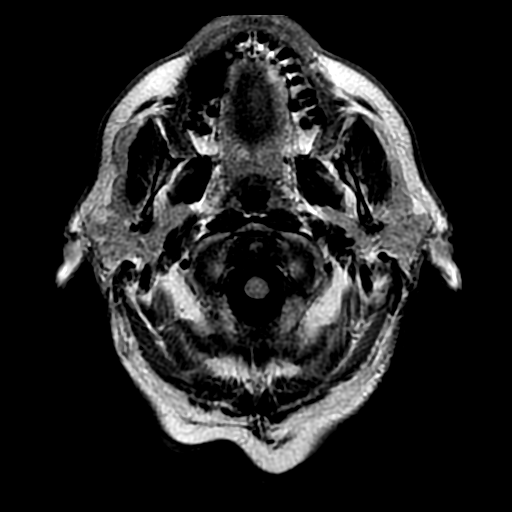
[im 25/25]
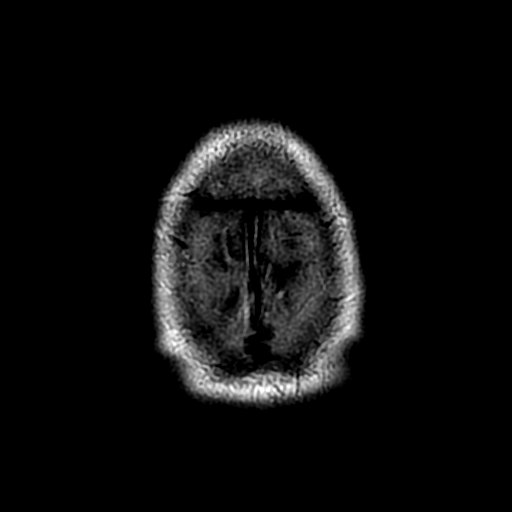

[Series 12: T2 · coronal · 5.0mm · 0.39mm/px · 2 of 28 slices shown (2 of 2)]
[im 1/28]
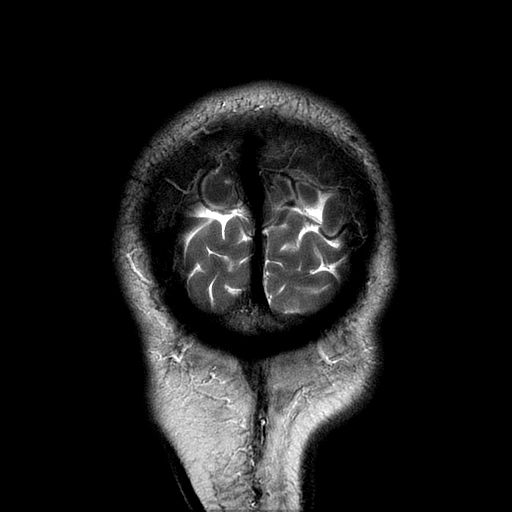
[im 28/28]
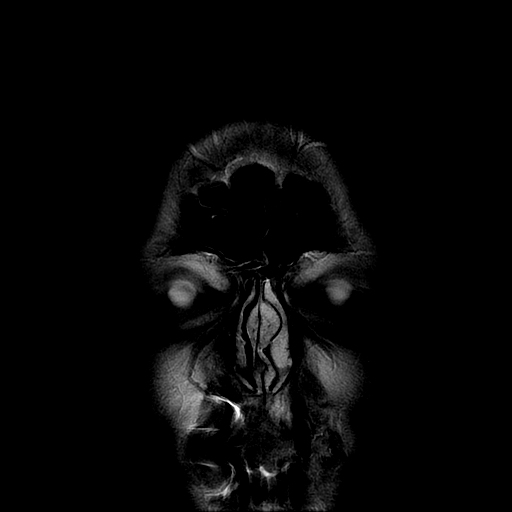

[Series 300: DWI · axial · 3.0mm · 1.09mm/px · z∈[-76,+68]mm · 4 of 49 slices shown (3 of 4)]
[im 1/49]
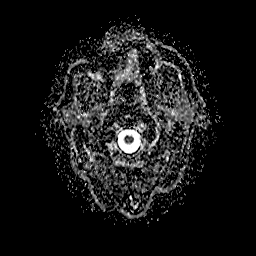
[im 17/49]
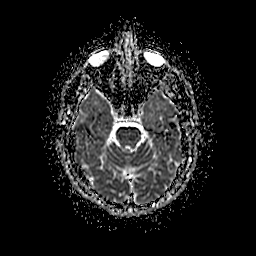
[im 33/49]
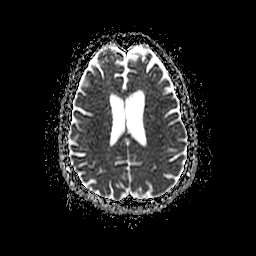
[im 49/49]
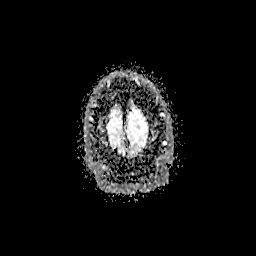

[Series 700: DWI · coronal · 5.0mm · 1.09mm/px · 3 of 33 slices shown (4 of 4)]
[im 1/33]
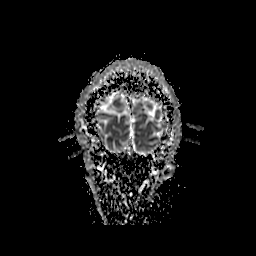
[im 17/33]
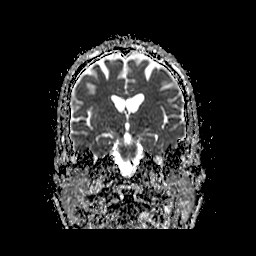
[im 33/33]
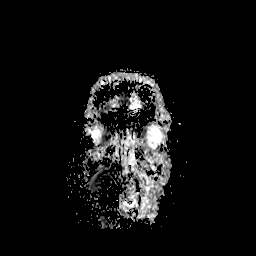

[32 of 48 positions shown; findings below may reference images not displayed]

FINDINGS: MRI HEAD FINDINGS

Brain: Negative for acute infarction. Several small subcortical
white matter hyperintensities left frontal lobe. Hyperintensity in
the central pons. Negative for hemorrhage or mass. Ventricle size
and cerebral volume normal for age.

Vascular: Normal arterial flow void

Skull and upper cervical spine: Negative

Sinuses/Orbits: Mild mucosal edema paranasal sinuses.  Normal orbit.

Other: None

MRA HEAD FINDINGS

Both vertebral arteries are patent to the basilar without stenosis.
Basilar widely patent. PICA, superior cerebellar, and posterior
cerebral artery is normal bilaterally.

Abnormal cluster of vessels in the superior cerebellar vermis with a
prominent draining vein extending into the torcula. This may be
supplied by the left posterior cerebral artery which is larger than
the right and extends into the vascular cluster. Findings suspicious
for arteriovenous malformation.

Internal carotid artery widely patent bilaterally. Anterior and
middle cerebral arteries widely patent bilaterally.
IMPRESSION: Negative for acute infarct. Mild chronic changes in the white matter
and pons likely due to microvascular ischemia

No emergent large vessel intracranial occlusion.

Findings suspicious for AVM in the superior cerebellar cistern.
Recommend CT head for further evaluation.

## 2018-11-24 ENCOUNTER — Other Ambulatory Visit: Payer: Self-pay | Admitting: Endocrinology

## 2018-11-24 DIAGNOSIS — E119 Type 2 diabetes mellitus without complications: Secondary | ICD-10-CM

## 2018-12-25 ENCOUNTER — Other Ambulatory Visit: Payer: Self-pay | Admitting: Endocrinology

## 2018-12-25 DIAGNOSIS — E119 Type 2 diabetes mellitus without complications: Secondary | ICD-10-CM

## 2019-01-26 ENCOUNTER — Other Ambulatory Visit: Payer: Self-pay | Admitting: Endocrinology

## 2019-01-26 ENCOUNTER — Other Ambulatory Visit: Payer: Self-pay

## 2019-01-26 DIAGNOSIS — E119 Type 2 diabetes mellitus without complications: Secondary | ICD-10-CM

## 2019-01-27 ENCOUNTER — Ambulatory Visit: Payer: Medicare Other | Admitting: Endocrinology

## 2019-01-27 ENCOUNTER — Encounter: Payer: Self-pay | Admitting: Endocrinology

## 2019-01-27 VITALS — BP 134/64 | HR 70 | Ht 67.0 in | Wt 158.8 lb

## 2019-01-27 DIAGNOSIS — E1159 Type 2 diabetes mellitus with other circulatory complications: Secondary | ICD-10-CM

## 2019-01-27 DIAGNOSIS — E119 Type 2 diabetes mellitus without complications: Secondary | ICD-10-CM | POA: Diagnosis not present

## 2019-01-27 DIAGNOSIS — R110 Nausea: Secondary | ICD-10-CM | POA: Diagnosis not present

## 2019-01-27 LAB — POCT GLYCOSYLATED HEMOGLOBIN (HGB A1C): Hemoglobin A1C: 7.8 % — AB (ref 4.0–5.6)

## 2019-01-27 MED ORDER — REPAGLINIDE 1 MG PO TABS: 1.0000 mg | ORAL_TABLET | Freq: Two times a day (BID) | ORAL | 3 refills | Status: DC

## 2019-01-27 NOTE — Progress Notes (Signed)
Subjective:    Patient ID: Jason Bailey, male    DOB: 1950/11/12, 68 y.o.   MRN: [id number]  HPI Pt returns for f/u of diabetes mellitus:  DM type: 2 (but lean body habitus suggests he may be evolving type 1) Dx'ed: 9030 Complications: CAD, PN, and TIA Therapy: 4 oral meds.   DKA: never Severe hypoglycemia: never Pancreatitis: never Pancreatic imaging: never Other: he has never been on insulin, but he has learned about insulin injections, just in case Interval history: He still has intermitt nausea.  Temporarily stopping Januvia helps.  no cbg record, but states cbg varies from 120-220.  It is in general higher as the day goes on.   Past Medical History:  Diagnosis Date  . Chest pain   . Coronary artery disease   . Diabetes mellitus   . Heart attack ([place])    approx. 2009  . Hyperlipidemia   . Hypertension   . Malaise and fatigue   . Osteoarthritis   . Right foot drop   . Right shoulder pain   . Vision abnormalities     Past Surgical History:  Procedure Laterality Date  . 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 KNEE SURGERY      Social History   Socioeconomic History  . Marital status: Married    Spouse name: Not on file  . Number of children: Not on file  . Years of education: Not on file  . Highest education level: Not on file  Occupational History  . Not on file  Social Needs  . Financial resource strain: Not on file  . Food insecurity    Worry: Not on file    Inability: Not on file  . Transportation needs    Medical: Not on file    Non-medical: Not on file  Tobacco Use  . Smoking status: Former Research scientist (life sciences)  . Smokeless tobacco: Never Used  . Tobacco comment: quit 4 yrs ago  Substance and Sexual Activity  . Alcohol use: No  . Drug use: No  . Sexual activity: Not on file  Lifestyle  . Physical activity    Days per week: Not on file    Minutes per session: Not on file  . Stress: Not on file  Relationships  . Social Clinical research associate on phone: Not on file    Gets together: Not on file    Attends religious service: Not on file    Active member of club or organization: Not on file    Attends meetings of clubs or organizations: Not on file    Relationship status: Not on file  . Intimate partner violence    Fear of current or ex partner: Not on file    Emotionally abused: Not on file    Physically abused: Not on file    Forced sexual activity: Not on file  Other Topics Concern  . Not on file  Social History Narrative  . Not on file    Current Outpatient Medications on File Prior to Visit  Medication Sig Dispense Refill  . aspirin 325 MG tablet Take 1 tablet (325 mg total) by mouth daily. 30 tablet 3  . cholecalciferol (VITAMIN D) 1000 units tablet Take 1,000 Units by mouth daily.    . citalopram (CELEXA) 10 MG tablet Take 5 mg by mouth daily.    . empagliflozin (JARDIANCE) 25 MG TABS tablet Take 12.5 mg by mouth daily. 45 tablet 3  .  fluticasone (FLONASE) 50 MCG/ACT nasal spray Place 2 sprays into both nostrils daily as needed for allergies.    Marland Kitchen lisinopril (PRINIVIL,ZESTRIL) 10 MG tablet Take 5 mg by mouth daily.     . metFORMIN (GLUCOPHAGE-XR) 500 MG 24 hr tablet TAKE TWO TABLETS BY MOUTH TWICE DAILY FOR DIABETES 120 tablet 0  . montelukast (SINGULAIR) 10 MG tablet Take 1 tablet by mouth daily.     . nitroGLYCERIN (NITROSTAT) 0.4 MG SL tablet Place 1 tablet (0.4 mg total) under the tongue every 5 (five) minutes as needed for chest pain. 25 tablet 3  . pravastatin (PRAVACHOL) 40 MG tablet Take 1 tablet (40 mg total) by mouth daily. 30 tablet 1  . sitaGLIPtin (JANUVIA) 100 MG tablet Take 0.5 tablets (50 mg total) by mouth daily. 45 tablet 3  . Testosterone Cypionate 200 MG/ML KIT Inject 25 mg into the muscle once a week.    . vitamin B-12 (CYANOCOBALAMIN) 500 MCG tablet Take 500 mcg by mouth daily.       No current facility-administered medications on file prior to visit.     No Known Allergies  Family  History  Problem Relation Age of Onset  . Breast cancer Mother   . Liver cancer Father   . Renal cancer Sister   . Diabetes Maternal Grandfather     BP 134/64 (BP Location: Left Arm, Patient Position: Sitting, Cuff Size: Normal)   Pulse 70   Ht _0  (1.702 m)   Wt 158 lb 12.8 oz (72 kg)   SpO2 99%   BMI 24.87 kg/m   Review of Systems He denies hypoglycemia.      Objective:   Physical Exam VITAL SIGNS:  See vs page GENERAL: no distress Pulses: dorsalis pedis intact bilat.   MSK: no deformity of the feet CV: no leg edema Skin:  no ulcer on the feet.  normal color and temp on the feet.   Neuro: sensation is intact to touch on the feet, but decreased from normal.    Lab Results  Component Value Date   HGBA1C 7.8 (A) 01/27/2019        Assessment & Plan:  Type 2 DM, with CAD: worse Nausea, persistent.  We discussed.  She wants to continue for now.   Patient Instructions  Please increase the repaglinide to 1 mg, with breakfast, and supper.  Please continue the same other diabetes medications.   check your blood sugar once a day.  vary the time of day when you check, between before the 3 meals, and at bedtime.  also check if you have symptoms of your blood sugar being too high or too low.  please keep a record of the readings and bring it to your next appointment here (or you can bring the meter itself).  You can write it on any piece of paper.  please call us sooner if your blood sugar goes below 70, or if you have a lot of readings over 200.   Please come back for a follow-up appointment in 2-3 months.

## 2019-01-27 NOTE — Patient Instructions (Signed)
Please increase the repaglinide to 1 mg, with breakfast, and supper.  Please continue the same other diabetes medications.   check your blood sugar once a day.  vary the time of day when you check, between before the 3 meals, and at bedtime.  also check if you have symptoms of your blood sugar being too high or too low.  please keep a record of the readings and bring it to your next appointment here (or you can bring the meter itself).  You can write it on any piece of paper.  please call us sooner if your blood sugar goes below 70, or if you have a lot of readings over 200.   Please come back for a follow-up appointment in 2-3 months.

## 2019-02-18 ENCOUNTER — Other Ambulatory Visit: Payer: Self-pay | Admitting: Endocrinology

## 2019-02-18 DIAGNOSIS — E119 Type 2 diabetes mellitus without complications: Secondary | ICD-10-CM

## 2019-03-25 ENCOUNTER — Emergency Department (HOSPITAL_COMMUNITY): Payer: Medicare Other

## 2019-03-25 ENCOUNTER — Other Ambulatory Visit: Payer: Self-pay

## 2019-03-25 ENCOUNTER — Inpatient Hospital Stay (HOSPITAL_COMMUNITY)
Admission: EM | Admit: 2019-03-25 | Discharge: 2019-03-27 | DRG: 473 | Disposition: A | Discharge status: Home / Self Care | Payer: Medicare Other | Attending: Neurosurgery | Admitting: Neurosurgery

## 2019-03-25 ENCOUNTER — Encounter (HOSPITAL_COMMUNITY): Payer: Self-pay | Admitting: Emergency Medicine

## 2019-03-25 DIAGNOSIS — Z79899 Other long term (current) drug therapy: Secondary | ICD-10-CM

## 2019-03-25 DIAGNOSIS — M5010 Cervical disc disorder with radiculopathy, unspecified cervical region: Secondary | ICD-10-CM | POA: Diagnosis not present

## 2019-03-25 DIAGNOSIS — E119 Type 2 diabetes mellitus without complications: Secondary | ICD-10-CM | POA: Diagnosis present

## 2019-03-25 DIAGNOSIS — Z8673 Personal history of transient ischemic attack (TIA), and cerebral infarction without residual deficits: Secondary | ICD-10-CM

## 2019-03-25 DIAGNOSIS — M5020 Other cervical disc displacement, unspecified cervical region: Secondary | ICD-10-CM | POA: Diagnosis present

## 2019-03-25 DIAGNOSIS — M21371 Foot drop, right foot: Secondary | ICD-10-CM | POA: Diagnosis present

## 2019-03-25 DIAGNOSIS — Z20828 Contact with and (suspected) exposure to other viral communicable diseases: Secondary | ICD-10-CM | POA: Diagnosis present

## 2019-03-25 DIAGNOSIS — E785 Hyperlipidemia, unspecified: Secondary | ICD-10-CM | POA: Diagnosis present

## 2019-03-25 DIAGNOSIS — Z7952 Long term (current) use of systemic steroids: Secondary | ICD-10-CM

## 2019-03-25 DIAGNOSIS — I10 Essential (primary) hypertension: Secondary | ICD-10-CM | POA: Diagnosis present

## 2019-03-25 DIAGNOSIS — I2510 Atherosclerotic heart disease of native coronary artery without angina pectoris: Secondary | ICD-10-CM | POA: Diagnosis present

## 2019-03-25 DIAGNOSIS — M542 Cervicalgia: Secondary | ICD-10-CM | POA: Diagnosis not present

## 2019-03-25 DIAGNOSIS — Z01812 Encounter for preprocedural laboratory examination: Secondary | ICD-10-CM

## 2019-03-25 DIAGNOSIS — Z419 Encounter for procedure for purposes other than remedying health state, unspecified: Secondary | ICD-10-CM

## 2019-03-25 DIAGNOSIS — Z87891 Personal history of nicotine dependence: Secondary | ICD-10-CM

## 2019-03-25 DIAGNOSIS — Z7982 Long term (current) use of aspirin: Secondary | ICD-10-CM

## 2019-03-25 DIAGNOSIS — Z7984 Long term (current) use of oral hypoglycemic drugs: Secondary | ICD-10-CM

## 2019-03-25 DIAGNOSIS — I252 Old myocardial infarction: Secondary | ICD-10-CM

## 2019-03-25 DIAGNOSIS — M4802 Spinal stenosis, cervical region: Secondary | ICD-10-CM | POA: Diagnosis present

## 2019-03-25 DIAGNOSIS — Z833 Family history of diabetes mellitus: Secondary | ICD-10-CM

## 2019-03-25 DIAGNOSIS — Z951 Presence of aortocoronary bypass graft: Secondary | ICD-10-CM

## 2019-03-25 LAB — CBC WITH DIFFERENTIAL/PLATELET
Abs Immature Granulocytes: 0.05 10*3/uL (ref 0.00–0.07)
Basophils Absolute: 0 10*3/uL (ref 0.0–0.1)
Basophils Relative: 0 %
Eosinophils Absolute: 0 10*3/uL (ref 0.0–0.5)
Eosinophils Relative: 0 %
HCT: 50.7 % (ref 39.0–52.0)
Hemoglobin: 17 g/dL (ref 13.0–17.0)
Immature Granulocytes: 0 %
Lymphocytes Relative: 18 %
Lymphs Abs: 2.2 10*3/uL (ref 0.7–4.0)
MCH: 32.3 pg (ref 26.0–34.0)
MCHC: 33.5 g/dL (ref 30.0–36.0)
MCV: 96.4 fL (ref 80.0–100.0)
Monocytes Absolute: 0.9 10*3/uL (ref 0.1–1.0)
Monocytes Relative: 8 %
Neutro Abs: 9 10*3/uL — ABNORMAL HIGH (ref 1.7–7.7)
Neutrophils Relative %: 74 %
Platelets: 307 10*3/uL (ref 150–400)
RBC: 5.26 MIL/uL (ref 4.22–5.81)
RDW: 13.2 % (ref 11.5–15.5)
WBC: 12.2 10*3/uL — ABNORMAL HIGH (ref 4.0–10.5)
nRBC: 0 % (ref 0.0–0.2)

## 2019-03-25 LAB — COMPREHENSIVE METABOLIC PANEL
ALT: 15 U/L (ref 0–44)
AST: 15 U/L (ref 15–41)
Albumin: 4.1 g/dL (ref 3.5–5.0)
Alkaline Phosphatase: 95 U/L (ref 38–126)
Anion gap: 16 — ABNORMAL HIGH (ref 5–15)
BUN: 11 mg/dL (ref 8–23)
CO2: 20 mmol/L — ABNORMAL LOW (ref 22–32)
Calcium: 9.4 mg/dL (ref 8.9–10.3)
Chloride: 102 mmol/L (ref 98–111)
Creatinine, Ser: 0.6 mg/dL — ABNORMAL LOW (ref 0.61–1.24)
GFR calc Af Amer: >60 mL/min (ref >60)
GFR calc non Af Amer: >60 mL/min (ref >60)
Glucose, Bld: 219 mg/dL — ABNORMAL HIGH (ref 70–99)
Potassium: 4 mmol/L (ref 3.5–5.1)
Sodium: 138 mmol/L (ref 135–145)
Total Bilirubin: 0.8 mg/dL (ref 0.3–1.2)
Total Protein: 6.9 g/dL (ref 6.5–8.1)

## 2019-03-25 LAB — GLUCOSE, CAPILLARY: Glucose-Capillary: 225 mg/dL — ABNORMAL HIGH (ref 70–99)

## 2019-03-25 MED ORDER — REPAGLINIDE 1 MG PO TABS
1.0000 mg | ORAL_TABLET | Freq: Every day | ORAL | Status: DC
  Administered 2019-03-25: 1 mg via ORAL
  Filled 2019-03-25 (×3): qty 1

## 2019-03-25 MED ORDER — NITROGLYCERIN 0.4 MG SL SUBL: 0.4000 mg | SUBLINGUAL_TABLET | SUBLINGUAL | Status: DC | PRN

## 2019-03-25 MED ORDER — HYDROCODONE-ACETAMINOPHEN 5-325 MG PO TABS
1.0000 | ORAL_TABLET | ORAL | Status: DC | PRN
  Administered 2019-03-25 – 2019-03-27 (×5): 1 via ORAL
  Filled 2019-03-25 (×4): qty 1
  Filled 2019-03-25: qty 2

## 2019-03-25 MED ORDER — METFORMIN HCL ER 500 MG PO TB24
1000.0000 mg | ORAL_TABLET | Freq: Two times a day (BID) | ORAL | Status: DC
  Administered 2019-03-25 – 2019-03-27 (×3): 1000 mg via ORAL
  Filled 2019-03-25 (×5): qty 2

## 2019-03-25 MED ORDER — MORPHINE SULFATE (PF) 4 MG/ML IV SOLN: 4.0000 mg | INTRAVENOUS | Status: DC | PRN

## 2019-03-25 MED ORDER — KETOROLAC TROMETHAMINE 30 MG/ML IJ SOLN
30.0000 mg | Freq: Once | INTRAMUSCULAR | Status: AC
  Administered 2019-03-25: 30 mg via INTRAMUSCULAR
  Filled 2019-03-25: qty 1

## 2019-03-25 MED ORDER — CITALOPRAM HYDROBROMIDE 10 MG PO TABS
5.0000 mg | ORAL_TABLET | Freq: Every day | ORAL | Status: DC
  Filled 2019-03-25 (×3): qty 1

## 2019-03-25 MED ORDER — HYDROXYZINE HCL 50 MG/ML IM SOLN: 50.0000 mg | INTRAMUSCULAR | Status: DC | PRN

## 2019-03-25 MED ORDER — CYANOCOBALAMIN 500 MCG PO TABS
500.0000 ug | ORAL_TABLET | Freq: Every day | ORAL | Status: DC
  Filled 2019-03-25 (×3): qty 1

## 2019-03-25 MED ORDER — MONTELUKAST SODIUM 10 MG PO TABS
10.0000 mg | ORAL_TABLET | Freq: Every day | ORAL | Status: DC
  Administered 2019-03-25 – 2019-03-26 (×2): 10 mg via ORAL
  Filled 2019-03-25 (×3): qty 1

## 2019-03-25 MED ORDER — LINAGLIPTIN 5 MG PO TABS
5.0000 mg | ORAL_TABLET | Freq: Every day | ORAL | Status: DC
  Filled 2019-03-25 (×3): qty 1

## 2019-03-25 MED ORDER — CANAGLIFLOZIN 100 MG PO TABS
100.0000 mg | ORAL_TABLET | Freq: Every day | ORAL | Status: DC
  Administered 2019-03-26: 100 mg via ORAL
  Filled 2019-03-25 (×2): qty 1

## 2019-03-25 MED ORDER — LISINOPRIL 10 MG PO TABS
5.0000 mg | ORAL_TABLET | Freq: Every day | ORAL | Status: DC
  Administered 2019-03-25: 5 mg via ORAL
  Filled 2019-03-25 (×2): qty 1

## 2019-03-25 MED ORDER — PRAVASTATIN SODIUM 40 MG PO TABS: 40.0000 mg | ORAL_TABLET | Freq: Every day | ORAL | Status: DC

## 2019-03-25 MED ORDER — VITAMIN D 25 MCG (1000 UNIT) PO TABS: 1000.0000 [IU] | ORAL_TABLET | Freq: Every day | ORAL | Status: DC

## 2019-03-25 MED ORDER — HYDROXYZINE HCL 25 MG PO TABS: 50.0000 mg | ORAL_TABLET | ORAL | Status: DC | PRN

## 2019-03-25 MED ORDER — METHOCARBAMOL 500 MG PO TABS
500.0000 mg | ORAL_TABLET | Freq: Three times a day (TID) | ORAL | Status: DC | PRN
  Administered 2019-03-25 – 2019-03-26 (×2): 500 mg via ORAL
  Filled 2019-03-25 (×2): qty 1

## 2019-03-25 NOTE — ED Provider Notes (Signed)
[facility] Provider Note   CSN: [id number] Arrival date & time: 03/25/19  1030     History   Chief Complaint Chief Complaint  Patient presents with   Neck Pain   sent by dr    HPI Jason Bailey is a 68 y.o. male.     HPI Patient presents with neck pain.  Has had for the last few days.  Seen by PCP and started on steroids.  It is in his lower neck and radiates primarily to the right arm.  Worse with movements.  States he thinks it was after lifting something similar to heavy form.  Now has occasional pain in his left arm also.  No chest pain.  No lightheadedness or dizziness.  Got started on steroids yesterday by PCP.  Today family members called Dr. Sherwood Bailey, who is seen before about follow-up.  States he was told to come into the ER. Past Medical History:  Diagnosis Date   Chest pain    Coronary artery disease    Diabetes mellitus    Heart attack ([place])    approx. 2009   Hyperlipidemia    Hypertension    Malaise and fatigue    Osteoarthritis    Right foot drop    Right shoulder pain    Vision abnormalities     Patient Active Problem List   Diagnosis Date Noted   TIA (transient ischemic attack) 06/14/2017   Coronary atherosclerosis 09/29/2008   Bilateral carotid bruits 09/29/2008   Diabetes mellitus type 2, uncomplicated ([place]) 94/85/4627   HYPERLIPIDEMIA 09/28/2008   Essential hypertension 09/28/2008   OSTEOARTHRITIS 09/28/2008   MALAISE AND FATIGUE 09/28/2008   CHEST PAIN, ATYPICAL 09/28/2008    Past Surgical History:  Procedure Laterality Date   CAD     disease status post CABG x 4 on 02/14/2007   KNEE SURGERY          Home Medications    Prior to Admission medications   Medication Sig Start Date End Date Taking? Authorizing Provider  aspirin 325 MG tablet Take 1 tablet (325 mg total) by mouth daily. 06/14/17  Yes [facility], Jason K, MD  cholecalciferol (VITAMIN D) 1000 units tablet Take  1,000 Units by mouth daily.   Yes [provider]  citalopram (CELEXA) 10 MG tablet Take 5 mg by mouth at bedtime.  05/11/16  Yes [provider]  cyclobenzaprine (FLEXERIL) 5 MG tablet Take 5 mg by mouth 3 (three) times daily as needed for muscle spasms. 03/23/19  Yes [provider]  empagliflozin (JARDIANCE) 25 MG TABS tablet Take 12.5 mg by mouth daily. 10/20/18  Yes Jason Shin, MD  lisinopril (PRINIVIL,ZESTRIL) 10 MG tablet Take 5 mg by mouth at bedtime.    Yes [provider]  metFORMIN (GLUCOPHAGE-XR) 500 MG 24 hr tablet TAKE TWO TABLETS BY MOUTH TWICE DAILY FOR DIABETES Patient taking differently: Take 1,000 mg by mouth 2 (two) times daily.  02/18/19  Yes Jason Kingdom, MD  montelukast (SINGULAIR) 10 MG tablet Take 1 tablet by mouth at bedtime.  07/07/18  Yes [provider]  nitroGLYCERIN (NITROSTAT) 0.4 MG SL tablet Place 1 tablet (0.4 mg total) under the tongue every 5 (five) minutes as needed for chest pain. 01/12/13  Yes Jason Hector, MD  pravastatin (PRAVACHOL) 40 MG tablet Take 1 tablet (40 mg total) by mouth daily. Patient taking differently: Take 40 mg by mouth at bedtime.  06/14/17  Yes Jason Bailey, Jason Emerald, MD  predniSONE (  DELTASONE) 20 MG tablet Take 20-40 mg by mouth See admin instructions. Take '40mg'$  daily for two days, then take '20mg'$  daily for two days, then stop. 03/24/19  Yes [provider]  repaglinide (PRANDIN) 1 MG tablet Take 1 tablet (1 mg total) by mouth 2 (two) times daily before a meal. Patient taking differently: Take 1 mg by mouth at bedtime.  01/27/19  Yes Jason Shin, MD  sitaGLIPtin (JANUVIA) 100 MG tablet Take 0.5 tablets (50 mg total) by mouth daily. Patient taking differently: Take 50 mg by mouth at bedtime.  10/20/18  Yes Jason Shin, MD  Testosterone Cypionate 200 MG/ML KIT Inject 100 mg into the muscle every 7 (seven) days.    Yes [provider]  vitamin B-12 (CYANOCOBALAMIN) 500 MCG tablet Take  500 mcg by mouth daily.     Yes [provider]    Family History Family History  Problem Relation Age of Onset   Breast cancer Mother    Liver cancer Father    Renal cancer Sister    Diabetes Maternal Grandfather     Social History Social History   Tobacco Use   Smoking status: Former Smoker   Smokeless tobacco: Never Used   Tobacco comment: quit 4 yrs ago  Substance Use Topics   Alcohol use: No   Drug use: No     Allergies   Patient has no known allergies.   Review of Systems Review of Systems  Constitutional: Negative for appetite change.  HENT: Negative for congestion.   Respiratory: Negative for chest tightness.   Cardiovascular: Negative for chest pain.  Gastrointestinal: Negative for abdominal pain.  Genitourinary: Negative for flank pain.  Musculoskeletal: Positive for neck pain.  Skin: Negative for rash.  Neurological: Negative for weakness.  Psychiatric/Behavioral: Negative for confusion.     Physical Exam Updated Vital Signs BP 104/65 (BP Location: Right Arm)    Pulse 90    Temp 97.7 F (36.5 C) (Oral)    Resp 20    Ht '5\' 8"'$  (1.727 m)    Wt 73.5 kg    SpO2 99%    BMI 24.63 kg/m   Physical Exam Vitals signs and nursing note reviewed.  HENT:     Head: Atraumatic.  Eyes:     Extraocular Movements: Extraocular movements intact.     Pupils: Pupils are equal, round, and reactive to light.  Neck:     Musculoskeletal: Neck supple.  Cardiovascular:     Rate and Rhythm: Normal rate and regular rhythm.  Pulmonary:     Breath sounds: Normal breath sounds.  Musculoskeletal:        General: Tenderness present.     Comments: Tenderness over around C7-T1.  Neurovascular intact in bilateral hands.  Good flexion and extension at the wrists elbows and shoulders.  Worse with stretching the arms forward.  Skin:    General: Skin is warm.     Capillary Refill: Capillary refill takes less than 2 seconds.  Neurological:     General: No focal  deficit present.     Mental Status: He is alert.      ED Treatments / Results  Labs (all labs ordered are listed, but only abnormal results are displayed) Labs Reviewed  CBC WITH DIFFERENTIAL/PLATELET - Abnormal; Notable for the following components:      Result Value   WBC 12.2 (*)    Neutro Abs 9.0 (*)    All other components within normal limits  COMPREHENSIVE METABOLIC PANEL - Abnormal;  Notable for the following components:   CO2 20 (*)    Glucose, Bld 219 (*)    Creatinine, Ser 0.60 (*)    Anion gap 16 (*)    All other components within normal limits    EKG None  Radiology No results found.  Procedures Procedures (including critical care time)  Medications Ordered in ED Medications  ketorolac (TORADOL) 30 MG/ML injection 30 mg (30 mg Intramuscular Given 03/25/19 1457)     Initial Impression / Assessment and Plan / ED Course  I have reviewed the triage vital signs and the nursing notes.  Pertinent labs & imaging results that were available during my care of the patient were reviewed by me and considered in my medical decision making (see chart for details).        Patient with neck pain.  Discussed with Dr. Sherwood Bailey.  Recommends MRI.  Will get imaging.  Also chest x-ray to help screen for other pathology such as aortic dissection.  However this seems much more musculoskeletal since it is tender with palpation on the back.  Will likely need to increase pain meds for home.  Call Dr. Donnella Bi office and hopefully be seen in the office tomorrow.  Care turned over to [facility]  Final Clinical Impressions(s) / ED Diagnoses   Final diagnoses:  Neck pain    ED Discharge Orders    None       Davonna Belling, MD 03/25/19 1538

## 2019-03-25 NOTE — ED Triage Notes (Addendum)
C/o neck pain, sent by dr Sherwood Gambler- has numbness radiating down right arm with pain in elbow area-- primary dr put pt on steriods yesterday

## 2019-03-25 NOTE — H&P (Signed)
Subjective: Patient is a 68 y.o. right-handed male who is admitted for treatment of acute, disabling right cervical radiculopathy with weakness in the right upper extremity.  Patient symptoms began about 3 weeks ago, it may have been brought on by having lifted something awkwardly.  The pain worsened steadily tickly over the past 5 days.  With increasing pain in the right side of the base the neck and upper back, which extends down through the right shoulder arm and forearm.  He has had a sense of weakness in the right upper extremity, but does have old traumatic orthopedic injuries to that limb as well.  Because of the severity of the pain this morning, his wife brought him to the [facility] ER for evaluation.  Patient was seen by Dr. Davonna Belling (EDP) who obtained an MRI of the cervical spine.  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  Patient was admitted for pain management and to proceed with surgical intervention.  Patient Active Problem List   Diagnosis Date Noted  . Cervical stenosis of spine 03/25/2019  . TIA (transient ischemic attack) 06/14/2017  . Coronary atherosclerosis 09/29/2008  . Bilateral carotid bruits 09/29/2008  . Diabetes mellitus type 2, uncomplicated ([place]) 64/33/2951  . HYPERLIPIDEMIA 09/28/2008  . Essential hypertension 09/28/2008  . OSTEOARTHRITIS 09/28/2008  . MALAISE AND FATIGUE 09/28/2008  . CHEST PAIN, ATYPICAL 09/28/2008   Past Medical History:  Diagnosis Date  . Chest pain   . Coronary artery disease   . Diabetes mellitus   . Heart attack ([place])    approx. 2009  . Hyperlipidemia   . Hypertension   . Malaise and fatigue   . Osteoarthritis   . Right foot drop   . Right shoulder pain   . Vision abnormalities     Past Surgical History:  Procedure Laterality Date  . 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 KNEE SURGERY      Medications Prior to Admission  Medication Sig  Dispense Refill Last Dose  . aspirin 325 MG tablet Take 1 tablet (325 mg total) by mouth daily. 30 tablet 3 03/25/2019 at Unknown time  . cholecalciferol (VITAMIN D) 1000 units tablet Take 1,000 Units by mouth daily.   03/25/2019 at Unknown time  . citalopram (CELEXA) 10 MG tablet Take 5 mg by mouth at bedtime.    03/24/2019 at Unknown time  . cyclobenzaprine (FLEXERIL) 5 MG tablet Take 5 mg by mouth 3 (three) times daily as needed for muscle spasms.   03/24/2019 at Unknown time  . empagliflozin (JARDIANCE) 25 MG TABS tablet Take 12.5 mg by mouth daily. 45 tablet 3 03/25/2019 at Unknown time  . lisinopril (PRINIVIL,ZESTRIL) 10 MG tablet Take 5 mg by mouth at bedtime.    03/24/2019 at Unknown time  . metFORMIN (GLUCOPHAGE-XR) 500 MG 24 hr tablet TAKE TWO TABLETS BY MOUTH TWICE DAILY FOR DIABETES (Patient taking differently: Take 1,000 mg by mouth 2 (two) times daily. ) 120 tablet 0 03/25/2019 at Unknown time  . montelukast (SINGULAIR) 10 MG tablet Take 1 tablet by mouth at bedtime.    03/24/2019 at Unknown time  . nitroGLYCERIN (NITROSTAT) 0.4 MG SL tablet Place 1 tablet (0.4 mg total) under the tongue every 5 (five) minutes as needed for chest pain. 25 tablet 3 UNK  . pravastatin (PRAVACHOL) 40 MG tablet Take 1 tablet (40 mg total) by mouth daily. (Patient taking differently: Take 40  mg by mouth at bedtime. ) 30 tablet 1 03/24/2019 at Unknown time  . predniSONE (DELTASONE) 20 MG tablet Take 20-40 mg by mouth See admin instructions. Take 50m daily for two days, then take 250mdaily for two days, then stop.   03/24/2019 at Unknown time  . repaglinide (PRANDIN) 1 MG tablet Take 1 tablet (1 mg total) by mouth 2 (two) times daily before a meal. (Patient taking differently: Take 1 mg by mouth at bedtime. ) 180 tablet 3 03/24/2019 at Unknown time  . sitaGLIPtin (JANUVIA) 100 MG tablet Take 0.5 tablets (50 mg total) by mouth daily. (Patient taking differently: Take 50 mg by mouth at bedtime. ) 45 tablet 3  03/24/2019 at Unknown time  . Testosterone Cypionate 200 MG/ML KIT Inject 100 mg into the muscle every 7 (seven) days.    03/18/2019  . vitamin B-12 (CYANOCOBALAMIN) 500 MCG tablet Take 500 mcg by mouth daily.     03/25/2019 at Unknown time   No Known Allergies  Social History   Tobacco Use  . Smoking status: Former SmResearch scientist (life sciences). Smokeless tobacco: Never Used  . Tobacco comment: quit 4 yrs ago  Substance Use Topics  . Alcohol use: No    Family History  Problem Relation Age of Onset  . Breast cancer Mother   . Liver cancer Father   . Renal cancer Sister   . Diabetes Maternal Grandfather      Review of Systems Pertinent items noted in HPI and remainder of comprehensive ROS otherwise negative.  Objective: Vital signs in last 24 hours: Temp:  [97.7 F (36.5 C)] 97.7 F (36.5 C) (10/14 2003) Pulse Rate:  [71-90] 71 (10/14 2003) Resp:  [18-20] 18 (10/14 2003) BP: (104-135)/(61-78) 115/70 (10/14 2003) SpO2:  [99 %] 99 % (10/14 2003) Weight:  [73.5 kg] 73.5 kg (10/14 1116)  EXAM: Patient is well-developed well-nourished white male in discomfort, but no acute distress.   Lungs are clear to auscultation , the patient has symmetrical respiratory excursion. Heart has a regular rate and rhythm normal S1 and S2 no murmur.   Abdomen is soft nontender nondistended bowel sounds are present. Extremity examination shows no clubbing cyanosis or edema. Motor examination shows 5/5 strength in the left upper extremity including deltoid, biceps, triceps, wrist extensor, intrinsics, and grip.  However there is significant weakness in the right upper extremity.  Deltoid is 5, biceps is 4, triceps is 5, wrist extensor is 4-, grip is 4, and intrinsics are 5.  Sensation is intact to pinprick through the distal upper extremities.  Data Review:CBC    Component Value Date/Time   WBC 12.2 (H) 03/25/2019 1119   RBC 5.26 03/25/2019 1119   HGB 17.0 03/25/2019 1119   HCT 50.7 03/25/2019 1119   PLT 307 03/25/2019  1119   MCV 96.4 03/25/2019 1119   MCH 32.3 03/25/2019 1119   MCHC 33.5 03/25/2019 1119   RDW 13.2 03/25/2019 1119   LYMPHSABS 2.2 03/25/2019 1119   MONOABS 0.9 03/25/2019 1119   EOSABS 0.0 03/25/2019 1119   BASOSABS 0.0 03/25/2019 1119                          BMET    Component Value Date/Time   NA 138 03/25/2019 1119   K 4.0 03/25/2019 1119   CL 102 03/25/2019 1119   CO2 20 (L) 03/25/2019 1119   GLUCOSE 219 (H) 03/25/2019 1119   BUN 11 03/25/2019 1119   CREATININE  0.60 (L) 03/25/2019 1119   CALCIUM 9.4 03/25/2019 1119   GFRNONAA >60 03/25/2019 1119   GFRAA >60 03/25/2019 1119     Assessment/Plan: Patient with acute right cervical radiculopathy with significant weakness of the right biceps, wrist extensor, and grip due to a large central to right C5-6 HNP.  Patient is admitted for pain management and to proceed with surgery for decompression and stabilization, specifically a C5-6 anterior cervical decompression and arthrodesis with structural allograft and cervical plating.  I've discussed with the patient the nature of his condition, the nature the surgical procedure, the typical length of surgery, hospital stay, and overall recuperation. We discussed limitations postoperatively. I discussed risks of surgery including risks of infection, bleeding, possibly need for transfusion, the risk of nerve root dysfunction with pain, weakness, numbness, or paresthesias, the risk of spinal cord dysfunction with paralysis of all 4 limbs and quadriplegia, and the risk of dural tear and CSF leakage and possible need for further surgery, the risk of esophageal dysfunction causing dysphagia and the risk of laryngeal dysfunction causing hoarseness of the voice, the risk of failure of the arthrodesis and the possible need for further surgery, and the risk of anesthetic complications including myocardial infarction, stroke, pneumonia, and death. We also discussed the need for postoperative immobilization in  a cervical collar. Understanding all this the patient does wish to proceed with surgery and is admitted for such.   Hosie Spangle, MD 03/25/2019 8:22 PM

## 2019-03-25 NOTE — ED Provider Notes (Signed)
  Provider Note MRN:  [id number]  Arrival date & time: 03/25/19    ED Course and Medical Decision Making  Assumed care from Dr. Alvino Chapel at shift change.  Neck pain, neurosurgery requesting MRI to be performed here.  Has follow-up tomorrow with neurosurgery.  MRI reveals severe cervical stenosis.  I was called by Dr. Sherwood Gambler, who requests that patient be admitted for observation so that Dr. Lucia Gaskins can see him tomorrow morning.  Final Clinical Impressions(s) / ED Diagnoses     ICD-10-CM   1. Neck pain  M54.2     ED Discharge Orders    None      Discharge Instructions   None     Barth Kirks. Sedonia Small, Section [email or web address]    Maudie Flakes, MD 03/25/19 2329

## 2019-03-26 ENCOUNTER — Encounter (HOSPITAL_COMMUNITY)
Admission: EM | Disposition: A | Discharge status: Home / Self Care | Payer: Self-pay | Source: Home / Self Care | Attending: Neurosurgery

## 2019-03-26 ENCOUNTER — Inpatient Hospital Stay (HOSPITAL_COMMUNITY): Payer: Medicare Other

## 2019-03-26 ENCOUNTER — Inpatient Hospital Stay (HOSPITAL_COMMUNITY): Payer: Medicare Other | Admitting: Certified Registered"

## 2019-03-26 ENCOUNTER — Encounter (HOSPITAL_COMMUNITY): Payer: Self-pay | Admitting: Orthopedic Surgery

## 2019-03-26 DIAGNOSIS — E119 Type 2 diabetes mellitus without complications: Secondary | ICD-10-CM | POA: Diagnosis present

## 2019-03-26 DIAGNOSIS — Z7984 Long term (current) use of oral hypoglycemic drugs: Secondary | ICD-10-CM | POA: Diagnosis not present

## 2019-03-26 DIAGNOSIS — I252 Old myocardial infarction: Secondary | ICD-10-CM | POA: Diagnosis not present

## 2019-03-26 DIAGNOSIS — Z951 Presence of aortocoronary bypass graft: Secondary | ICD-10-CM | POA: Diagnosis not present

## 2019-03-26 DIAGNOSIS — M4802 Spinal stenosis, cervical region: Secondary | ICD-10-CM | POA: Diagnosis present

## 2019-03-26 DIAGNOSIS — Z7982 Long term (current) use of aspirin: Secondary | ICD-10-CM | POA: Diagnosis not present

## 2019-03-26 DIAGNOSIS — I10 Essential (primary) hypertension: Secondary | ICD-10-CM | POA: Diagnosis present

## 2019-03-26 DIAGNOSIS — M5020 Other cervical disc displacement, unspecified cervical region: Secondary | ICD-10-CM | POA: Diagnosis present

## 2019-03-26 DIAGNOSIS — M542 Cervicalgia: Secondary | ICD-10-CM | POA: Diagnosis present

## 2019-03-26 DIAGNOSIS — Z87891 Personal history of nicotine dependence: Secondary | ICD-10-CM | POA: Diagnosis not present

## 2019-03-26 DIAGNOSIS — I2510 Atherosclerotic heart disease of native coronary artery without angina pectoris: Secondary | ICD-10-CM | POA: Diagnosis present

## 2019-03-26 DIAGNOSIS — E785 Hyperlipidemia, unspecified: Secondary | ICD-10-CM | POA: Diagnosis present

## 2019-03-26 DIAGNOSIS — Z79899 Other long term (current) drug therapy: Secondary | ICD-10-CM | POA: Diagnosis not present

## 2019-03-26 DIAGNOSIS — Z7952 Long term (current) use of systemic steroids: Secondary | ICD-10-CM | POA: Diagnosis not present

## 2019-03-26 DIAGNOSIS — M21371 Foot drop, right foot: Secondary | ICD-10-CM | POA: Diagnosis present

## 2019-03-26 DIAGNOSIS — Z20828 Contact with and (suspected) exposure to other viral communicable diseases: Secondary | ICD-10-CM | POA: Diagnosis present

## 2019-03-26 DIAGNOSIS — M5010 Cervical disc disorder with radiculopathy, unspecified cervical region: Secondary | ICD-10-CM | POA: Diagnosis present

## 2019-03-26 DIAGNOSIS — Z833 Family history of diabetes mellitus: Secondary | ICD-10-CM | POA: Diagnosis not present

## 2019-03-26 DIAGNOSIS — Z01812 Encounter for preprocedural laboratory examination: Secondary | ICD-10-CM | POA: Diagnosis not present

## 2019-03-26 DIAGNOSIS — Z8673 Personal history of transient ischemic attack (TIA), and cerebral infarction without residual deficits: Secondary | ICD-10-CM | POA: Diagnosis not present

## 2019-03-26 HISTORY — PX: ANTERIOR CERVICAL DECOMP/DISCECTOMY FUSION: SHX1161

## 2019-03-26 LAB — GLUCOSE, CAPILLARY
Glucose-Capillary: 128 mg/dL — ABNORMAL HIGH (ref 70–99)
Glucose-Capillary: 115 mg/dL — ABNORMAL HIGH (ref 70–99)
Glucose-Capillary: 107 mg/dL — ABNORMAL HIGH (ref 70–99)
Glucose-Capillary: 117 mg/dL — ABNORMAL HIGH (ref 70–99)
Glucose-Capillary: 182 mg/dL — ABNORMAL HIGH (ref 70–99)

## 2019-03-26 LAB — SARS CORONAVIRUS 2 (TAT 6-24 HRS): SARS Coronavirus 2: NEGATIVE

## 2019-03-26 LAB — SURGICAL PCR SCREEN
MRSA, PCR: NEGATIVE
Staphylococcus aureus: NEGATIVE

## 2019-03-26 SURGERY — ANTERIOR CERVICAL DECOMPRESSION/DISCECTOMY FUSION 1 LEVEL
Anesthesia: General | Site: Spine Cervical

## 2019-03-26 MED ORDER — THROMBIN 5000 UNITS EX SOLR
CUTANEOUS | Status: AC
  Filled 2019-03-26: qty 5000

## 2019-03-26 MED ORDER — ALBUMIN HUMAN 5 % IV SOLN
INTRAVENOUS | Status: AC
  Filled 2019-03-26: qty 250

## 2019-03-26 MED ORDER — ROCURONIUM BROMIDE 10 MG/ML (PF) SYRINGE
PREFILLED_SYRINGE | INTRAVENOUS | Status: AC
  Filled 2019-03-26: qty 10

## 2019-03-26 MED ORDER — OXYCODONE HCL 5 MG PO TABS: 5.0000 mg | ORAL_TABLET | Freq: Once | ORAL | Status: DC | PRN

## 2019-03-26 MED ORDER — ONDANSETRON HCL 4 MG/2ML IJ SOLN
INTRAMUSCULAR | Status: AC
  Filled 2019-03-26: qty 2

## 2019-03-26 MED ORDER — FENTANYL CITRATE (PF) 250 MCG/5ML IJ SOLN
INTRAMUSCULAR | Status: DC | PRN
  Administered 2019-03-26: 100 ug via INTRAVENOUS
  Administered 2019-03-26: 50 ug via INTRAVENOUS

## 2019-03-26 MED ORDER — PHENOL 1.4 % MT LIQD
1.0000 | OROMUCOSAL | Status: DC | PRN
  Filled 2019-03-26: qty 177

## 2019-03-26 MED ORDER — OXYCODONE HCL 5 MG/5ML PO SOLN: 5.0000 mg | Freq: Once | ORAL | Status: DC | PRN

## 2019-03-26 MED ORDER — SODIUM CHLORIDE 0.9 % IV SOLN
INTRAVENOUS | Status: DC | PRN
  Administered 2019-03-26: 20 ug/min via INTRAVENOUS

## 2019-03-26 MED ORDER — ROCURONIUM BROMIDE 10 MG/ML (PF) SYRINGE
PREFILLED_SYRINGE | INTRAVENOUS | Status: DC | PRN
  Administered 2019-03-26: 70 mg via INTRAVENOUS
  Administered 2019-03-26: 10 mg via INTRAVENOUS

## 2019-03-26 MED ORDER — THROMBIN 5000 UNITS EX SOLR
CUTANEOUS | Status: DC | PRN
  Administered 2019-03-26 (×2): 5000 [IU] via TOPICAL

## 2019-03-26 MED ORDER — PROPOFOL 10 MG/ML IV BOLUS
INTRAVENOUS | Status: AC
  Filled 2019-03-26: qty 20

## 2019-03-26 MED ORDER — ACETAMINOPHEN 10 MG/ML IV SOLN
1000.0000 mg | Freq: Once | INTRAVENOUS | Status: AC
  Administered 2019-03-26: 1000 mg via INTRAVENOUS

## 2019-03-26 MED ORDER — INSULIN ASPART 100 UNIT/ML ~~LOC~~ SOLN: 0.0000 [IU] | Freq: Every day | SUBCUTANEOUS | Status: DC

## 2019-03-26 MED ORDER — THROMBIN 5000 UNITS EX SOLR
OROMUCOSAL | Status: DC | PRN
  Administered 2019-03-26: 19:00:00 5 mL

## 2019-03-26 MED ORDER — ACETAMINOPHEN 10 MG/ML IV SOLN
INTRAVENOUS | Status: AC
  Filled 2019-03-26: qty 100

## 2019-03-26 MED ORDER — MENTHOL 3 MG MT LOZG
1.0000 | LOZENGE | OROMUCOSAL | Status: DC | PRN
  Filled 2019-03-26: qty 9

## 2019-03-26 MED ORDER — MAGNESIUM HYDROXIDE 400 MG/5ML PO SUSP: 30.0000 mL | Freq: Every day | ORAL | Status: DC | PRN

## 2019-03-26 MED ORDER — ACETAMINOPHEN 650 MG RE SUPP: 650.0000 mg | RECTAL | Status: DC | PRN

## 2019-03-26 MED ORDER — FENTANYL CITRATE (PF) 250 MCG/5ML IJ SOLN
INTRAMUSCULAR | Status: AC
  Filled 2019-03-26: qty 5

## 2019-03-26 MED ORDER — 0.9 % SODIUM CHLORIDE (POUR BTL) OPTIME
TOPICAL | Status: DC | PRN
  Administered 2019-03-26: 1000 mL

## 2019-03-26 MED ORDER — INSULIN ASPART 100 UNIT/ML ~~LOC~~ SOLN: 0.0000 [IU] | Freq: Three times a day (TID) | SUBCUTANEOUS | Status: DC

## 2019-03-26 MED ORDER — ACETAMINOPHEN 325 MG PO TABS: 650.0000 mg | ORAL_TABLET | ORAL | Status: DC | PRN

## 2019-03-26 MED ORDER — LACTATED RINGERS IV SOLN: INTRAVENOUS | Status: DC

## 2019-03-26 MED ORDER — FENTANYL CITRATE (PF) 100 MCG/2ML IJ SOLN: 25.0000 ug | INTRAMUSCULAR | Status: DC | PRN

## 2019-03-26 MED ORDER — BUPIVACAINE HCL (PF) 0.5 % IJ SOLN
INTRAMUSCULAR | Status: AC
  Filled 2019-03-26: qty 30

## 2019-03-26 MED ORDER — ALUM & MAG HYDROXIDE-SIMETH 200-200-20 MG/5ML PO SUSP: 30.0000 mL | Freq: Four times a day (QID) | ORAL | Status: DC | PRN

## 2019-03-26 MED ORDER — BUPIVACAINE HCL (PF) 0.5 % IJ SOLN
INTRAMUSCULAR | Status: DC | PRN
  Administered 2019-03-26: 10 mL

## 2019-03-26 MED ORDER — BISACODYL 10 MG RE SUPP: 10.0000 mg | Freq: Every day | RECTAL | Status: DC | PRN

## 2019-03-26 MED ORDER — CEFAZOLIN SODIUM-DEXTROSE 2-3 GM-%(50ML) IV SOLR
INTRAVENOUS | Status: DC | PRN
  Administered 2019-03-26: 2 g via INTRAVENOUS

## 2019-03-26 MED ORDER — CYCLOBENZAPRINE HCL 5 MG PO TABS
5.0000 mg | ORAL_TABLET | Freq: Three times a day (TID) | ORAL | Status: DC | PRN
  Administered 2019-03-27: 5 mg via ORAL
  Filled 2019-03-26: qty 1

## 2019-03-26 MED ORDER — SODIUM CHLORIDE 0.9 % IV SOLN
INTRAVENOUS | Status: DC | PRN
  Administered 2019-03-26: 500 mL

## 2019-03-26 MED ORDER — PROPOFOL 10 MG/ML IV BOLUS
INTRAVENOUS | Status: DC | PRN
  Administered 2019-03-26: 130 mg via INTRAVENOUS

## 2019-03-26 MED ORDER — ALBUMIN HUMAN 5 % IV SOLN
12.5000 g | Freq: Once | INTRAVENOUS | Status: AC
  Administered 2019-03-26: 12.5 g via INTRAVENOUS

## 2019-03-26 MED ORDER — SODIUM CHLORIDE 0.9 % IV SOLN: 250.0000 mL | INTRAVENOUS | Status: DC

## 2019-03-26 MED ORDER — MIDAZOLAM HCL 5 MG/5ML IJ SOLN
INTRAMUSCULAR | Status: DC | PRN
  Administered 2019-03-26: 2 mg via INTRAVENOUS

## 2019-03-26 MED ORDER — HEMOSTATIC AGENTS (NO CHARGE) OPTIME
TOPICAL | Status: DC | PRN
  Administered 2019-03-26: 1

## 2019-03-26 MED ORDER — ONDANSETRON HCL 4 MG PO TABS: 4.0000 mg | ORAL_TABLET | Freq: Four times a day (QID) | ORAL | Status: DC | PRN

## 2019-03-26 MED ORDER — PHENYLEPHRINE HCL (PRESSORS) 10 MG/ML IV SOLN
INTRAVENOUS | Status: AC
  Filled 2019-03-26: qty 1

## 2019-03-26 MED ORDER — LIDOCAINE-EPINEPHRINE 1 %-1:100000 IJ SOLN
INTRAMUSCULAR | Status: DC | PRN
  Administered 2019-03-26: 10 mL

## 2019-03-26 MED ORDER — LIDOCAINE-EPINEPHRINE 1 %-1:100000 IJ SOLN
INTRAMUSCULAR | Status: AC
  Filled 2019-03-26: qty 1

## 2019-03-26 MED ORDER — THROMBIN 5000 UNITS EX SOLR
CUTANEOUS | Status: AC
  Filled 2019-03-26: qty 10000

## 2019-03-26 MED ORDER — FLEET ENEMA 7-19 GM/118ML RE ENEM: 1.0000 | ENEMA | Freq: Once | RECTAL | Status: DC | PRN

## 2019-03-26 MED ORDER — MIDAZOLAM HCL 2 MG/2ML IJ SOLN
INTRAMUSCULAR | Status: AC
  Filled 2019-03-26: qty 2

## 2019-03-26 MED ORDER — ONDANSETRON HCL 4 MG/2ML IJ SOLN: 4.0000 mg | Freq: Once | INTRAMUSCULAR | Status: DC | PRN

## 2019-03-26 MED ORDER — ONDANSETRON HCL 4 MG/2ML IJ SOLN: 4.0000 mg | Freq: Four times a day (QID) | INTRAMUSCULAR | Status: DC | PRN

## 2019-03-26 MED ORDER — SODIUM CHLORIDE 0.9% FLUSH: 3.0000 mL | Freq: Two times a day (BID) | INTRAVENOUS | Status: DC

## 2019-03-26 MED ORDER — DEXAMETHASONE SODIUM PHOSPHATE 10 MG/ML IJ SOLN
INTRAMUSCULAR | Status: AC
  Filled 2019-03-26: qty 1

## 2019-03-26 MED ORDER — LIDOCAINE 2% (20 MG/ML) 5 ML SYRINGE
INTRAMUSCULAR | Status: AC
  Filled 2019-03-26: qty 5

## 2019-03-26 MED ORDER — SUGAMMADEX SODIUM 200 MG/2ML IV SOLN
INTRAVENOUS | Status: DC | PRN
  Administered 2019-03-26: 200 mg via INTRAVENOUS

## 2019-03-26 MED ORDER — LIDOCAINE 2% (20 MG/ML) 5 ML SYRINGE
INTRAMUSCULAR | Status: DC | PRN
  Administered 2019-03-26: 80 mg via INTRAVENOUS

## 2019-03-26 MED ORDER — ONDANSETRON HCL 4 MG/2ML IJ SOLN
INTRAMUSCULAR | Status: DC | PRN
  Administered 2019-03-26: 4 mg via INTRAVENOUS

## 2019-03-26 MED ORDER — CEFAZOLIN SODIUM 1 G IJ SOLR
INTRAMUSCULAR | Status: AC
  Filled 2019-03-26: qty 20

## 2019-03-26 MED ORDER — DEXAMETHASONE SODIUM PHOSPHATE 10 MG/ML IJ SOLN
INTRAMUSCULAR | Status: DC | PRN
  Administered 2019-03-26: 4 mg via INTRAVENOUS

## 2019-03-26 MED ORDER — SODIUM CHLORIDE 0.9% FLUSH: 3.0000 mL | INTRAVENOUS | Status: DC | PRN

## 2019-03-26 SURGICAL SUPPLY — 58 items
ADH SKN CLS APL DERMABOND .7 (GAUZE/BANDAGES/DRESSINGS) ×1
BAG DECANTER FOR FLEXI CONT (MISCELLANEOUS) ×2 IMPLANT
BIT DRILL 14X2.5XNS TI ANT (BIT) IMPLANT
BIT DRILL AVIATOR 14 (BIT) ×2
BIT DRILL NEURO 2X3.1 SFT TUCH (MISCELLANEOUS) ×1 IMPLANT
BIT DRL 14X2.5XNS TI ANT (BIT) ×1
BLADE ULTRA TIP 2M (BLADE) IMPLANT
CANISTER SUCT 3000ML PPV (MISCELLANEOUS) ×2 IMPLANT
CARTRIDGE OIL MAESTRO DRILL (MISCELLANEOUS) ×1 IMPLANT
COVER MAYO STAND STRL (DRAPES) ×2 IMPLANT
COVER WAND RF STERILE (DRAPES) ×2 IMPLANT
DECANTER SPIKE VIAL GLASS SM (MISCELLANEOUS) ×2 IMPLANT
DERMABOND ADVANCED (GAUZE/BANDAGES/DRESSINGS) ×1
DERMABOND ADVANCED .7 DNX12 (GAUZE/BANDAGES/DRESSINGS) ×1 IMPLANT
DIFFUSER DRILL AIR PNEUMATIC (MISCELLANEOUS) ×2 IMPLANT
DRAPE HALF SHEET 40X57 (DRAPES) IMPLANT
DRAPE LAPAROTOMY 100X72 PEDS (DRAPES) ×2 IMPLANT
DRAPE MICROSCOPE LEICA (MISCELLANEOUS) ×2 IMPLANT
DRAPE POUCH INSTRU U-SHP 10X18 (DRAPES) ×2 IMPLANT
DRILL NEURO 2X3.1 SOFT TOUCH (MISCELLANEOUS) ×2
ELECT COATED BLADE 2.86 ST (ELECTRODE) ×2 IMPLANT
ELECT REM PT RETURN 9FT ADLT (ELECTROSURGICAL) ×2
ELECTRODE REM PT RTRN 9FT ADLT (ELECTROSURGICAL) ×1 IMPLANT
GAUZE SPONGE 4X4 12PLY STRL (GAUZE/BANDAGES/DRESSINGS) ×1 IMPLANT
GLOVE BIOGEL PI IND STRL 8 (GLOVE) ×1 IMPLANT
GLOVE BIOGEL PI INDICATOR 8 (GLOVE) ×1
GLOVE ECLIPSE 7.5 STRL STRAW (GLOVE) ×2 IMPLANT
GLOVE EXAM NITRILE XL STR (GLOVE) IMPLANT
GOWN STRL REUS W/ TWL LRG LVL3 (GOWN DISPOSABLE) IMPLANT
GOWN STRL REUS W/ TWL XL LVL3 (GOWN DISPOSABLE) ×1 IMPLANT
GOWN STRL REUS W/TWL 2XL LVL3 (GOWN DISPOSABLE) IMPLANT
GOWN STRL REUS W/TWL LRG LVL3 (GOWN DISPOSABLE)
GOWN STRL REUS W/TWL XL LVL3 (GOWN DISPOSABLE) ×2
GRAFT CORT CANC 14X8.25X11 5D (Bone Implant) ×1 IMPLANT
HALTER HD/CHIN CERV TRACTION D (MISCELLANEOUS) ×2 IMPLANT
HEMOSTAT POWDER KIT SURGIFOAM (HEMOSTASIS) ×2 IMPLANT
KIT BASIN OR (CUSTOM PROCEDURE TRAY) ×2 IMPLANT
KIT TURNOVER KIT B (KITS) ×2 IMPLANT
NDL HYPO 25GX1X1/2 BEV (NEEDLE) ×1 IMPLANT
NDL SPNL 22GX3.5 QUINCKE BK (NEEDLE) ×1 IMPLANT
NEEDLE HYPO 25GX1X1/2 BEV (NEEDLE) ×2 IMPLANT
NEEDLE SPNL 22GX3.5 QUINCKE BK (NEEDLE) ×2 IMPLANT
NS IRRIG 1000ML POUR BTL (IV SOLUTION) ×2 IMPLANT
OIL CARTRIDGE MAESTRO DRILL (MISCELLANEOUS) ×2
PACK LAMINECTOMY NEURO (CUSTOM PROCEDURE TRAY) ×2 IMPLANT
PAD ARMBOARD 7.5X6 YLW CONV (MISCELLANEOUS) ×6 IMPLANT
PLATE AVIATOR ASSY 1LVL SZ 12 (Plate) ×1 IMPLANT
RUBBERBAND STERILE (MISCELLANEOUS) ×4 IMPLANT
SCREW AVIATOR VAR SELFTAP 4X14 (Screw) ×4 IMPLANT
SPONGE INTESTINAL PEANUT (DISPOSABLE) ×2 IMPLANT
SPONGE SURGIFOAM ABS GEL SZ50 (HEMOSTASIS) ×2 IMPLANT
STAPLER SKIN PROX WIDE 3.9 (STAPLE) IMPLANT
SUT VIC AB 2-0 CP2 18 (SUTURE) ×2 IMPLANT
SUT VIC AB 3-0 SH 8-18 (SUTURE) ×2 IMPLANT
TAPE CLOTH SURG 4X10 WHT LF (GAUZE/BANDAGES/DRESSINGS) ×1 IMPLANT
TOWEL GREEN STERILE (TOWEL DISPOSABLE) ×2 IMPLANT
TOWEL GREEN STERILE FF (TOWEL DISPOSABLE) IMPLANT
WATER STERILE IRR 1000ML POUR (IV SOLUTION) ×2 IMPLANT

## 2019-03-26 NOTE — Progress Notes (Signed)
Vitals:   03/26/19 1932 03/26/19 1946 03/26/19 1949 03/26/19 2001  BP: 93/64 (!) 84/67 (!) 85/65 104/65  Pulse: 80 77 84 75  Resp: 18 15 19 14   Temp: 97.7 F (36.5 C)     TempSrc:      SpO2: 94% 96% 96% 95%  Weight:      Height:        CBC Recent Labs    03/25/19 1119  WBC 12.2*  HGB 17.0  HCT 50.7  PLT 307   BMET Recent Labs    03/25/19 1119  NA 138  K 4.0  CL 102  CO2 20*  GLUCOSE 219*  BUN 11  CREATININE 0.60*  CALCIUM 9.4    Patient seen in PACU.  Systolic blood pressure drifted down to 85, started on 250 cc of albumin per Dr. Roanna Banning (anesthesia), and has already increased to 104.  Resting comfortably.  Following commands.  Moving all 4 extremities well.  Wound clean and dry; no erythema, ecchymosis, swelling, or drainage.  No void yet.  Plan: Continue PACU care to return to 3 central.  We will progress through the evening and tomorrow.  Hosie Spangle, MD 03/26/2019, 8:06 PM

## 2019-03-26 NOTE — Progress Notes (Signed)
Vitals:   03/25/19 2332 03/26/19 0448 03/26/19 0755 03/26/19 1151  BP: (!) 130/55 127/71 (!) 147/62 140/66  Pulse: 63 66 66 67  Resp: 18 18 18 18   Temp: 97.7 F (36.5 C) 97.7 F (36.5 C) 97.7 F (36.5 C) 97.9 F (36.6 C)  TempSrc: Oral Oral Oral Oral  SpO2: 97% 99% 95% 100%  Weight:      Height:        CBC Recent Labs    03/25/19 1119  WBC 12.2*  HGB 17.0  HCT 50.7  PLT 307   BMET Recent Labs    03/25/19 1119  NA 138  K 4.0  CL 102  CO2 20*  GLUCOSE 219*  BUN 11  CREATININE 0.60*  CALCIUM 9.4    Patient with continued right cervical radicular pain and weakness for C5-6 ACDF later today.  Plan: C5-6 ACDF.  Hosie Spangle, MD 03/26/2019, 12:18 PM

## 2019-03-26 NOTE — Anesthesia Preprocedure Evaluation (Addendum)
Anesthesia Evaluation  Patient identified by MRN, date of birth, ID band Patient awake    Reviewed: Allergy & Precautions, NPO status , Patient's Chart, lab work & pertinent test results  History of Anesthesia Complications Negative for: history of anesthetic complications  Airway Mallampati: II  TM Distance: >3 FB     Dental  (+) Dental Advisory Given, Teeth Intact   Pulmonary former smoker,    Pulmonary exam normal        Cardiovascular hypertension, Pt. on medications (-) angina+ CAD, + Past MI and + CABG (~2008)  Normal cardiovascular exam   '20 TTE - EF 60-65%. There is mildly increased left ventricular wall thickness. Left ventricular diastolic Doppler parameters are consistent with pseudonormalization. No valvulopathy    Neuro/Psych TIAnegative psych ROS   GI/Hepatic negative GI ROS, Neg liver ROS,   Endo/Other  diabetes, Type 2, Oral Hypoglycemic Agents  Renal/GU negative Renal ROS     Musculoskeletal  (+) Arthritis , Osteoarthritis,    Abdominal   Peds  Hematology negative hematology ROS (+)   Anesthesia Other Findings   Reproductive/Obstetrics                            Anesthesia Physical Anesthesia Plan  ASA: III  Anesthesia Plan: General   Post-op Pain Management:    Induction: Intravenous  PONV Risk Score and Plan: 3 and Treatment may vary due to age or medical condition, Ondansetron, Dexamethasone and Midazolam  Airway Management Planned: Oral ETT and Video Laryngoscope Planned  Additional Equipment: None  Intra-op Plan:   Post-operative Plan: Extubation in OR  Informed Consent: I have reviewed the patients History and Physical, chart, labs and discussed the procedure including the risks, benefits and alternatives for the proposed anesthesia with the patient or authorized representative who has indicated his/her understanding and acceptance.     Dental  advisory given  Plan Discussed with: CRNA and Anesthesiologist  Anesthesia Plan Comments:        Anesthesia Quick Evaluation

## 2019-03-26 NOTE — Anesthesia Postprocedure Evaluation (Signed)
Anesthesia Post Note  Patient: Jason Bailey  Procedure(s) Performed: ANTERIOR CERVICAL DECOMPRESSION/DISCECTOMY FUSION ONE LEVEL, CERVICAL FIVE - CERVICAL SIX (N/A Spine Cervical)     Patient location during evaluation: PACU Anesthesia Type: General Level of consciousness: awake and alert Pain management: pain level controlled Vital Signs Assessment: post-procedure vital signs reviewed and stable Respiratory status: spontaneous breathing, nonlabored ventilation, respiratory function stable and patient connected to nasal cannula oxygen Cardiovascular status: blood pressure returned to baseline and stable Postop Assessment: no apparent nausea or vomiting Anesthetic complications: no    Last Vitals:  Vitals:   03/26/19 2043 03/26/19 2046  BP: (!) 153/60 (!) 152/61  Pulse: 78 77  Resp: 14 15  Temp:    SpO2: 96% 97%    Last Pain:  Vitals:   03/26/19 2031  TempSrc:   PainSc: 1                  Jayin Derousse P Tramel Westbrook

## 2019-03-26 NOTE — Anesthesia Procedure Notes (Signed)
Procedure Name: Intubation Date/Time: 03/26/2019 5:46 PM Performed by: Amadeo Garnet, CRNA Pre-anesthesia Checklist: Patient identified, Emergency Drugs available, Suction available, Patient being monitored and Timeout performed Patient Re-evaluated:Patient Re-evaluated prior to induction Oxygen Delivery Method: Circle system utilized Preoxygenation: Pre-oxygenation with 100% oxygen Induction Type: IV induction Ventilation: Mask ventilation without difficulty Laryngoscope Size: Glidescope and 3 Grade View: Grade I Tube type: Oral Tube size: 7.5 mm Number of attempts: 1 Airway Equipment and Method: Stylet and Video-laryngoscopy Placement Confirmation: ETT inserted through vocal cords under direct vision,  positive ETCO2 and breath sounds checked- equal and bilateral Secured at: 22 cm Tube secured with: Tape Dental Injury: Teeth and Oropharynx as per pre-operative assessment

## 2019-03-26 NOTE — Op Note (Signed)
03/25/2019 - 03/26/2019  7:19 PM  PATIENT:  Jason Bailey  68 y.o. male  PRE-OPERATIVE DIAGNOSIS: C5-6 cervical disc herniation, cervical spondylosis, cervical degenerative disc disease, right cervical radiculopathy  POST-OPERATIVE DIAGNOSIS:  C5-6 cervical disc herniation, cervical spondylosis, cervical degenerative disc disease, right cervical radiculopathy  PROCEDURE:  Procedure(s): C5-6 anterior cervical decompression and arthrodesis with structural allograft and aviator cervical plating  SURGEON: Jovita Gamma, MD  ASSISTANTS: Ashok Pall, MD  ANESTHESIA:   general  EBL:  Total I/O In: -  Out: 64 [Blood:50]  BLOOD ADMINISTERED:none  COUNT:  Correct per nursing staff  DICTATION: Patient was brought to the operating room placed under general endotracheal anesthesia. Patient was placed in 10 pounds of halter traction. The neck was prepped with Betadine soap and solution and draped in a sterile fashion. A horizontal incision was made on the left side of the neck. The line of the incision was infiltrated with local anesthetic with epinephrine. Dissection was carried down thru the subcutaneous tissue and platysma, bipolar cautery was used to maintain hemostasis. Dissection was then carried down thru an avascular plane leaving the sternocleidomastoid carotid artery and jugular vein laterally and the trachea and esophagus medially. The ventral aspect of the vertebral column was identified and a localizing x-ray was taken. The C5-6 level was identified. The annulus was incised and the disc space entered. Discectomy was performed with micro-curettes and pituitary rongeurs. The operating microscope was draped and brought into the field provided additional magnification illumination and visualization. Discectomy was continued posteriorly thru the disc space and then the cartilaginous endplate was removed using micro-curettes along with the high-speed drill. Posterior osteophytic overgrowth was  removed using the high-speed drill along with a 2 mm thin footplated Kerrison punch. Posterior longitudinal ligament along with disc herniation was carefully removed, decompressing the spinal canal and thecal sac. We then continued to remove osteophytic overgrowth and disc material decompressing the neural foramina and exiting nerve roots bilaterally.  Intraoperatively we found a large disc herniation extending from the midline laterally into the right C5-6 neural foramen with significant thecal sac and nerve root compression.  The disc herniation was fully removed with good decompression of the thecal sac and exiting nerve roots.  Once the decompression was completed hemostasis was established with Surgifoam. The wound was irrigated and hemostasis confirmed. We then measured the height of the intravertebral disc space and selected a 8 millimeter in height structural allograft. It was hydrated and saline solution and then gently positioned in the intravertebral disc space and countersunk. We then selected a 12 millimeter in height Aviator cervical plate. It was positioned over the fusion construct and secured to the vertebra with a pair of 4 x 14 mm self-tapping variable screws at each level. Each screw hole was started with the high-speed drill and then the screws placed once all the screws were placed, the locking system was secured. The wound was irrigated with bacitracin solution checked for hemostasis which was established and confirmed. An x-ray was taken which showed the graft in good position, the plate and screws in good position, and the overall alignment was good. We then proceeded with closure. The platysma was closed with interrupted inverted 2-0 undyed Vicryl suture, the subcutaneous and subcuticular closed with interrupted inverted 3-0 undyed Vicryl suture. The skin edges were approximated with Dermabond. Following surgery the patient was taken out of cervical traction. To be reversed and the  anesthetic and taken to the recovery room for further care.  PLAN OF CARE:  Patient's initial postoperative care will be in the PACU, and then he is to return to the 3 central unit.  PATIENT DISPOSITION:  PACU - hemodynamically stable.   Delay start of Pharmacological VTE agent (>24hrs) due to surgical blood loss or risk of bleeding:  yes

## 2019-03-26 NOTE — Progress Notes (Signed)
Orthopedic Tech Progress Note Patient Details:  Jason Bailey [birth year] [id number] Showed patient how the collar would fit for after surgery which is later today at 1700 Ortho Devices Type of Ortho Device: Soft collar Ortho Device/Splint Interventions: Other (comment)   Post Interventions Patient Tolerated: Other (comment) Instructions Provided: Care of device, Adjustment of device, Other (comment)   Janit Pagan 03/26/2019, 8:02 AM

## 2019-03-26 NOTE — Transfer of Care (Signed)
Immediate Anesthesia Transfer of Care Note  Patient: [name]  Procedure(s) Performed: ANTERIOR CERVICAL DECOMPRESSION/DISCECTOMY FUSION ONE LEVEL, CERVICAL FIVE - CERVICAL SIX (N/A Spine Cervical)  Patient Location: PACU  Anesthesia Type:General  Level of Consciousness: sedated and patient cooperative  Airway & Oxygen Therapy: Patient Spontanous Breathing  Post-op Assessment: Report given to RN and Post -op Vital signs reviewed and stable  Post vital signs: Reviewed and stable  Last Vitals:  Vitals Value Taken Time  BP 93/64 03/26/19 1932  Temp    Pulse 76 03/26/19 1934  Resp 15 03/26/19 1934  SpO2 93 % 03/26/19 1934  Vitals shown include unvalidated device data.  Last Pain:  Vitals:   03/26/19 1557  TempSrc: Oral  PainSc:       Patients Stated Pain Goal: 3 ([id number])  Complications: No apparent anesthesia complications

## 2019-03-27 LAB — GLUCOSE, CAPILLARY
Glucose-Capillary: 104 mg/dL — ABNORMAL HIGH (ref 70–99)
Glucose-Capillary: 199 mg/dL — ABNORMAL HIGH (ref 70–99)

## 2019-03-27 MED ORDER — HYDROCODONE-ACETAMINOPHEN 5-325 MG PO TABS: 1.0000 | ORAL_TABLET | ORAL | 0 refills | Status: DC | PRN

## 2019-03-27 NOTE — Discharge Instructions (Signed)
  Call Your Doctor If Any of These Occur Redness, drainage, or swelling at the wound.  Temperature greater than 101 degrees. Severe pain not relieved by pain medication. Increased difficulty swallowing. Incision starts to come apart. Follow Up Appt Call today for appointment in 3 weeks ([phone]) or for problems.  If you have any hardware placed in your spine, you will need an x-ray before your appointment.   

## 2019-03-27 NOTE — Plan of Care (Signed)
Patient alert and oriented, mae's well, voiding adequate amount of urine, swallowing without difficulty, no c/o pain at time of discharge. Patient discharged home with family. Script sent to the pharmacy and discharged instructions given to patient. Patient and family stated understanding of instructions given. Patient has an appointment with Dr. Sherwood Gambler

## 2019-03-27 NOTE — Discharge Summary (Signed)
Physician Discharge Summary  Patient ID: Jason Bailey MRN: [id number] DOB/AGE: [birth year] 66 y.o.  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  His exam showed weakness of the right biceps, wrist extensor, and grip.  MRI scan revealed in the acute central to right C5-6 cervical disc herniation.  Patient position was taken to surgery for C5-6 anterior cervical decompression and arthrodesis with structural allograft and cervical plating.  Postoperatively he has had good relief of the right cervical radicular pain.  His incision is healing nicely but there is mild to moderate swelling of the anterior neck.  Our plan is to observe him for another 6 to 8 hours, but if it remains stable to discharge to home.  He and his family have been given instructions regarding wound care and activities.  He is to contact my office and schedule a follow-up appointment for 3 weeks from now.  Discharge Exam: Blood pressure (!) 168/72, pulse 80, temperature 98.4 F (36.9 C), temperature source Oral, resp. rate 18, height 5' 7.99" (1.727 m), weight 73.5 kg, SpO2 97 %.  Disposition: Discharge disposition: 01-Home or Self Care       Discharge Instructions    Discharge wound care:   Complete by: As directed    Leave the wound open to air. Shower daily with the wound uncovered. Water and soapy water should run over the incision area. Do not wash directly on the incision for 2 weeks. Remove the glue after 2 weeks.   Driving Restrictions   Complete by: As directed    No driving  for 2 weeks. May ride in the car locally now. May begin to drive locally in 2 weeks.   Other Restrictions   Complete by: As directed    Walk gradually increasing distances out in the fresh air at least twice a day. Walking additional 6 times inside the house, gradually increasing distances, daily. No bending, lifting, or twisting. Perform activities between shoulder and waist height (that is at counter height when standing or table height when sitting).     Allergies as of 03/27/2019   No Known Allergies     Medication List    TAKE these medications   aspirin 325 MG tablet Take 1 tablet (325 mg total) by mouth daily.   cholecalciferol 1000 units tablet Commonly known as: VITAMIN D Take 1,000 Units by mouth daily.   citalopram 10 MG tablet Commonly known as: CELEXA Take 5 mg by mouth at bedtime.   cyclobenzaprine 5 MG tablet Commonly known as: FLEXERIL Take 5 mg by mouth 3 (three) times daily as needed for muscle spasms.   empagliflozin 25 MG Tabs tablet Commonly known as: Jardiance Take 12.5 mg by mouth daily.   HYDROcodone-acetaminophen 5-325 MG tablet Commonly known as: NORCO/VICODIN Take 1-2 tablets by mouth every 4 (four) hours as needed (pain).   lisinopril 10 MG tablet Commonly known as: ZESTRIL Take 5 mg by mouth at bedtime.   metFORMIN 500 MG 24 hr tablet Commonly known as: GLUCOPHAGE-XR TAKE TWO TABLETS BY MOUTH TWICE DAILY FOR DIABETES What changed: See the new instructions.   montelukast 10 MG tablet Commonly known as: SINGULAIR Take  1 tablet by mouth at bedtime.   nitroGLYCERIN 0.4 MG SL tablet Commonly known as: NITROSTAT Place 1 tablet (0.4 mg total) under the tongue every 5 (five) minutes as needed for chest pain.   pravastatin 40 MG tablet Commonly known as: PRAVACHOL Take 1 tablet (40 mg total) by mouth daily. What changed: when to take this   predniSONE 20 MG tablet Commonly known as: DELTASONE Take 20-40 mg by mouth See admin  instructions. Take 38m daily for two days, then take 242mdaily for two days, then stop.   repaglinide 1 MG tablet Commonly known as: PRANDIN Take 1 tablet (1 mg total) by mouth 2 (two) times daily before a meal. What changed: when to take this   sitaGLIPtin 100 MG tablet Commonly known as: Januvia Take 0.5 tablets (50 mg total) by mouth daily. What changed: when to take this   Testosterone Cypionate 200 MG/ML Kit Inject 100 mg into the muscle every 7 (seven) days.   vitamin B-12 500 MCG tablet Commonly known as: CYANOCOBALAMIN Take 500 mcg by mouth daily.            Discharge Care Instructions  (From admission, onward)         Start     Ordered   03/27/19 0000  Discharge wound care:    Comments: Leave the wound open to air. Shower daily with the wound uncovered. Water and soapy water should run over the incision area. Do not wash directly on the incision for 2 weeks. Remove the glue after 2 weeks.   03/27/19 [id number]       Follow-up Information    NuJovita GammaMD.   Specialty: Neurosurgery Contact information: [address] [phone]         Signed: NUHosie Spangle0/16/2020, 8:42 AM

## 2019-03-30 ENCOUNTER — Encounter (HOSPITAL_COMMUNITY): Payer: Self-pay | Admitting: Neurosurgery

## 2019-04-06 ENCOUNTER — Other Ambulatory Visit: Payer: Self-pay

## 2019-04-06 DIAGNOSIS — E119 Type 2 diabetes mellitus without complications: Secondary | ICD-10-CM

## 2019-04-06 MED ORDER — METFORMIN HCL ER 500 MG PO TB24: 1000.0000 mg | ORAL_TABLET | Freq: Two times a day (BID) | ORAL | 1 refills | Status: DC

## 2019-04-14 ENCOUNTER — Ambulatory Visit: Payer: Medicare Other | Admitting: Endocrinology
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
## 2019-08-12 ENCOUNTER — Encounter: Payer: Self-pay | Admitting: Cardiovascular Disease

## 2019-08-12 ENCOUNTER — Ambulatory Visit: Payer: Medicare Other | Admitting: Cardiovascular Disease

## 2019-08-12 ENCOUNTER — Other Ambulatory Visit: Payer: Self-pay

## 2019-08-12 VITALS — BP 132/88 | HR 67 | Ht 67.0 in | Wt 164.0 lb

## 2019-08-12 DIAGNOSIS — I25810 Atherosclerosis of coronary artery bypass graft(s) without angina pectoris: Secondary | ICD-10-CM | POA: Diagnosis not present

## 2019-08-12 MED ORDER — ASPIRIN EC 81 MG PO TBEC: 81.0000 mg | DELAYED_RELEASE_TABLET | Freq: Every day | ORAL | Status: AC

## 2019-08-12 NOTE — Patient Instructions (Signed)
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
## 2020-08-08 ENCOUNTER — Ambulatory Visit: Payer: Medicare Other | Admitting: Cardiovascular Disease

## 2020-08-08 ENCOUNTER — Other Ambulatory Visit: Payer: Self-pay

## 2020-08-08 ENCOUNTER — Encounter: Payer: Self-pay | Admitting: Cardiovascular Disease

## 2020-08-08 VITALS — BP 140/68 | HR 76 | Ht 67.0 in | Wt 161.6 lb

## 2020-08-08 DIAGNOSIS — R0989 Other specified symptoms and signs involving the circulatory and respiratory systems: Secondary | ICD-10-CM

## 2020-08-08 DIAGNOSIS — Z951 Presence of aortocoronary bypass graft: Secondary | ICD-10-CM

## 2020-08-08 NOTE — Patient Instructions (Signed)
Medication Instructions:  *If you need a refill on your cardiac medications before your next appointment, please call your pharmacy*  Lab Work: If you have labs (blood work) drawn today and your tests are completely normal, you will receive your results only by: Marland Kitchen MyChart Message (if you have MyChart) OR . A paper copy in the mail If you have any lab test that is abnormal or we need to change your treatment, we will call you to review the results.  Testing/Procedures: Your physician has requested that you have a carotid duplex. This test is an ultrasound of the carotid arteries in your neck. It looks at blood flow through these arteries that supply the brain with blood. Allow one hour for this exam. There are no restrictions or special instructions.  Follow-Up: At [facility], you and your health needs are our priority.  As part of our continuing mission to provide you with exceptional heart care, we have created designated Provider Care Teams.  These Care Teams include your primary Cardiologist (physician) and Advanced Practice Providers (APPs -  Physician Assistants and Nurse Practitioners) who all work together to provide you with the care you need, when you need it.  We recommend signing up for the patient portal called "MyChart".  Sign up information is provided on this After Visit Summary.  MyChart is used to connect with patients for Virtual Visits (Telemedicine).  Patients are able to view lab/test results, encounter notes, upcoming appointments, etc.  Non-urgent messages can be sent to your provider as well.   To learn more about what you can do with MyChart, go to [email or web address].    Your next appointment:   1 year(s)  The format for your next appointment:   In Person  Provider:   You may see Dr. Johnsie Cancel or one of the following Advanced Practice Providers on your designated Care Team:    Kathyrn Drown, NP

## 2020-08-12 ENCOUNTER — Other Ambulatory Visit: Payer: Self-pay

## 2020-08-12 ENCOUNTER — Ambulatory Visit (HOSPITAL_COMMUNITY)
Admission: RE | Admit: 2020-08-12 | Discharge: 2020-08-12 | Disposition: A | Discharge status: Home / Self Care | Payer: Medicare Other | Source: Ambulatory Visit | Attending: Cardiology | Admitting: Cardiology

## 2020-08-12 DIAGNOSIS — Z951 Presence of aortocoronary bypass graft: Secondary | ICD-10-CM | POA: Diagnosis present

## 2020-08-12 DIAGNOSIS — R0989 Other specified symptoms and signs involving the circulatory and respiratory systems: Secondary | ICD-10-CM | POA: Diagnosis present

## 2020-08-18 ENCOUNTER — Telehealth: Payer: Self-pay | Admitting: Cardiovascular Disease

## 2020-08-18 NOTE — Telephone Encounter (Signed)
Jason Hector, MD  Michaelyn Barter, RN Plaque no stenosis f/u carotid duplex in 2 years   Patient aware of results.

## 2020-08-18 NOTE — Telephone Encounter (Signed)
Pt called in returning pam call for test results   Best number - [phone]

## 2021-11-30 ENCOUNTER — Telehealth: Payer: Self-pay

## 2021-11-30 NOTE — Telephone Encounter (Signed)
   Pre-operative Risk Assessment    Patient Name: Jason Bailey  DOB: [birth year] MRN: [id number]      Request for Surgical Clearance    Procedure:   COLONOSCOPY  Date of Surgery:  Clearance 01/16/22                                 Surgeon:  DR. BITAR Surgeon's Group or Practice Name:  [facility]  Phone number:  [phone] Fax number:  [phone]   Type of Clearance Requested:   - Medical  - Pharmacy:  Hold Aspirin NEEDS INSTRUCTIONS WHEN TO HOLD   Type of Anesthesia:  Not Indicated   Additional requests/questions:    SignedJacinta Shoe   11/30/2021, 5:07 PM

## 2021-11-30 NOTE — Telephone Encounter (Signed)
   Name: Jason Bailey  DOB: 10/05/1950  MRN: [id number]  Primary Cardiologist: None  Chart reviewed as part of pre-operative protocol coverage. Because of [name] past medical history and time since last visit, he will require a follow-up tele visit in order to better assess preoperative cardiovascular risk.  Pre-op covering staff: - Please schedule appointment and call patient to inform them. If patient already had an upcoming appointment within acceptable timeframe, please add "pre-op clearance" to the appointment notes so provider is aware. - Please contact requesting surgeon's office via preferred method (i.e, phone, fax) to inform them of need for appointment prior to surgery.  Patient is on aspirin due to remote CABG.  Would prefer to continue aspirin if possible.  Colonoscopy without biopsies is considered a low risk procedure.  Elgie Collard, PA-C  11/30/2021, 5:17 PM

## 2021-12-06 ENCOUNTER — Telehealth: Payer: Self-pay | Admitting: *Deleted

## 2021-12-06 NOTE — Telephone Encounter (Signed)
S/w the pt and his wife and agreeable to plan of care for tele pre op appt 12/14/21 @ 9:20. Med rec and consent are done.

## 2021-12-06 NOTE — Telephone Encounter (Signed)
S/w the pt and his wife and agreeable to plan of care for tele pre op appt 12/14/21 @ 9:20. Med rec and consent are done.     Patient Consent for Virtual Visit        Jason Bailey has provided verbal consent on 12/06/2021 for a virtual visit (video or telephone).   CONSENT FOR VIRTUAL VISIT FOR:  Jason Bailey  By participating in this virtual visit I agree to the following:  I hereby voluntarily request, consent and authorize [facility] and its employed or contracted physicians, physician assistants, nurse practitioners or other licensed health care professionals (the Practitioner), to provide me with telemedicine health care services (the "Services") as deemed necessary by the treating Practitioner. I acknowledge and consent to receive the Services by the Practitioner via telemedicine. I understand that the telemedicine visit will involve communicating with the Practitioner through live audiovisual communication technology and the disclosure of certain medical information by electronic transmission. I acknowledge that I have been given the opportunity to request an in-person assessment or other available alternative prior to the telemedicine visit and am voluntarily participating in the telemedicine visit.  I understand that I have the right to withhold or withdraw my consent to the use of telemedicine in the course of my care at any time, without affecting my right to future care or treatment, and that the Practitioner or I may terminate the telemedicine visit at any time. I understand that I have the right to inspect all information obtained and/or recorded in the course of the telemedicine visit and may receive copies of available information for a reasonable fee.  I understand that some of the potential risks of receiving the Services via telemedicine include:  Delay or interruption in medical evaluation due to technological equipment failure or disruption; Information transmitted may not  be sufficient (e.g. poor resolution of images) to allow for appropriate medical decision making by the Practitioner; and/or  In rare instances, security protocols could fail, causing a breach of personal health information.  Furthermore, I acknowledge that it is my responsibility to provide information about my medical history, conditions and care that is complete and accurate to the best of my ability. I acknowledge that Practitioner's advice, recommendations, and/or decision may be based on factors not within their control, such as incomplete or inaccurate data provided by me or distortions of diagnostic images or specimens that may result from electronic transmissions. I understand that the practice of medicine is not an exact science and that Practitioner makes no warranties or guarantees regarding treatment outcomes. I acknowledge that a copy of this consent can be made available to me via my patient portal ([facility]), or I can request a printed copy by calling the office of [facility].    I understand that my insurance will be billed for this visit.   I have read or had this consent read to me. I understand the contents of this consent, which adequately explains the benefits and risks of the Services being provided via telemedicine.  I have been provided ample opportunity to ask questions regarding this consent and the Services and have had my questions answered to my satisfaction. I give my informed consent for the services to be provided through the use of telemedicine in my medical care

## 2021-12-14 ENCOUNTER — Ambulatory Visit (INDEPENDENT_AMBULATORY_CARE_PROVIDER_SITE_OTHER): Payer: Medicare Other | Admitting: Nurse Practitioner

## 2021-12-14 DIAGNOSIS — Z01810 Encounter for preprocedural cardiovascular examination: Secondary | ICD-10-CM | POA: Diagnosis not present

## 2021-12-14 NOTE — Progress Notes (Signed)
Virtual Visit via Telephone Note   Because of JIA MOHAMED co-morbid illnesses, he is at least at moderate risk for complications without adequate follow up.  This format is felt to be most appropriate for this patient at this time.  The patient did not have access to video technology/had technical difficulties with video requiring transitioning to audio format only (telephone).  All issues noted in this document were discussed and addressed.  No physical exam could be performed with this format.  Please refer to the patient's chart for his consent to telehealth for [facility].  Evaluation Performed:  Preoperative cardiovascular risk assessment _____________   Date:  12/14/2021   Patient ID:  Jason Bailey, Jason Bailey 01-May-1951, MRN [id number] Patient Location:  Home Provider location:   Office  Primary Care Provider:  Tacey Ruiz, FNP Primary Cardiologist:  Charlton Haws, MD  Chief Complaint / Patient Profile   71 y.o. y/o male with a h/o CAD s/p CABG, pretension, hyperlipidemia, type 2 diabetes, vertigo, foot drop, and osteoarthritis who is pending colonoscopy on 01/16/2022 with Dr. Judith Part of [facility] and presents today for telephonic preoperative cardiovascular risk assessment.  Past Medical History    Past Medical History:  Diagnosis Date   Chest pain    Coronary artery disease    Diabetes mellitus    Heart attack (HCC)    approx. 2009   Hyperlipidemia    Hypertension    Malaise and fatigue    Osteoarthritis    Right foot drop    Right shoulder pain    Vision abnormalities    Past Surgical History:  Procedure Laterality Date   ANTERIOR CERVICAL DECOMP/DISCECTOMY FUSION N/A 03/26/2019   Procedure: ANTERIOR CERVICAL DECOMPRESSION/DISCECTOMY FUSION ONE LEVEL, CERVICAL FIVE - CERVICAL SIX;  Surgeon: Shirlean Kelly, MD;  Location: [facility] OR;  Service: Neurosurgery;  Laterality: N/A;  ANTERIOR CERVICAL DECOMPRESSION/DISCECTOMY FUSION ONE LEVEL, CERVICAL FIVE -  CERVICAL SIX    CAD     disease status post CABG x 4 on 02/14/2007   KNEE SURGERY      Allergies  No Known Allergies  History of Present Illness    Jason Bailey is a 71 y.o. male who presents via audio/video conferencing for a telehealth visit today.  Pt was last seen in cardiology clinic on 08/08/2020 by Dr. Eden Emms. At that time Jason Bailey was doing well.  The patient is now pending procedure as outlined above. Since his last visit, he has done well form a cardiac standpoint.  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. He denies chest pain, palpitations, dyspnea, pnd, orthopnea, n, v, dizziness, syncope, edema, weight gain, or early satiety. All other systems reviewed and are otherwise negative except as noted above.   Home Medications    Prior to Admission medications   Medication Sig Start Date End Date Taking? Authorizing Provider  aspirin EC 81 MG tablet Take 1 tablet (81 mg total) by mouth daily. 08/12/19   Wendall Stade, MD  cholecalciferol (VITAMIN D) 1000 units tablet Take 1,000 Units by mouth daily.    [provider]  citalopram (CELEXA) 10 MG tablet Take 5 mg by mouth at bedtime.  05/11/16   [provider]  cyclobenzaprine (FLEXERIL) 5 MG tablet Take 5 mg by mouth 3 (three) times daily as needed for muscle spasms. 03/23/19   [provider]  glimepiride (AMARYL) 2 MG tablet Take 1 tablet by mouth daily. 05/11/19 12/06/21  [provider]  HYDROcodone-acetaminophen (NORCO/VICODIN) 5-325 MG tablet Take 1-2 tablets by mouth every 4 (four) hours as needed (pain). 03/27/19   Jovita Gamma, MD  lisinopril (PRINIVIL,ZESTRIL) 10 MG tablet Take 5 mg by mouth at bedtime.     [provider]  metFORMIN (GLUCOPHAGE-XR) 500 MG 24 hr tablet Take 2 tablets (1,000 mg total) by mouth 2 (two) times daily. 04/06/19   Renato Shin, MD  montelukast (SINGULAIR) 10 MG tablet Take 1 tablet by mouth at bedtime.  07/07/18    [provider]  nitroGLYCERIN (NITROSTAT) 0.4 MG SL tablet Place 1 tablet (0.4 mg total) under the tongue every 5 (five) minutes as needed for chest pain. 01/12/13   Josue Hector, MD  pravastatin (PRAVACHOL) 40 MG tablet Take 1 tablet (40 mg total) by mouth daily. Patient taking differently: Take 40 mg by mouth at bedtime. 06/14/17   Rai, Vernelle Emerald, MD  Testosterone Cypionate 200 MG/ML KIT Inject 100 mg into the muscle every 7 (seven) days.     [provider]  vitamin B-12 (CYANOCOBALAMIN) 500 MCG tablet Take 500 mcg by mouth daily.    [provider]    Physical Exam    Vital Signs:  Jason Bailey does not have vital signs available for review today.  Given telephonic nature of communication, physical exam is limited. AAOx3. NAD. Normal affect.  Speech and respirations are unlabored.  Accessory Clinical Findings    None  Assessment & Plan    1.  Preoperative Cardiovascular Risk Assessment:  According to the Revised Cardiac Risk Index (RCRI), his Perioperative Risk of Major Cardiac Event is (%): 0.9. His Functional Capacity in METs is: 7.01 according to the Duke Activity Status Index (DASI).Therefore, based on ACC/AHA guidelines, patient would be at acceptable risk for the planned procedure without further cardiovascular testing.  Patient is on aspirin due to remote CABG.  We recommend continuing Aspirin throughout the perioperative period if possible. Colonoscopy without biopsies is considered low risk. However, if necessary, patient may hold Aspirin for 7 days prior to procedure.  Please resume Aspirin as soon as possible postprocedure, at the discretion of the surgeon.  A copy of this note will be routed to requesting surgeon.  Time:   Today, I have spent 5 minutes with the patient with telehealth technology discussing medical history, symptoms, and management plan.     Lenna Sciara, NP  12/14/2021, 9:35 AM

## 2022-01-25 NOTE — Progress Notes (Signed)
Patient ID: Jakeb H Westhoff, male   DOB: 11/19/1950, 71 y.o.   MRN: [id number]     71 y.o. history of CABG with Dr Hendrickson 2008 Last myovue normal 2014  His thyroid nodules are stable. He has s right  bruit. Active with no SSCP. Compliant with meds. Primary checking cholesterol A1c in 6 range. .   CABG 2008 Hendrickson  PROCEDURE: Median sternotomy, extracorporeal circulation, coronary  artery bypass grafting x4 (left internal mammary artery to left anterior  descending artery, saphenous vein graft to first diagonal, saphenous  vein graft to obtuse marginal #1, saphenous vein graft to posterior  descending), endoscopic vein harvest, right leg.  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  All other systems reviewed and negative  General: Ht 5' 7" (1.702 m)   Wt 159 lb (72.1 kg)   BMI 24.90 kg/m  Affect appropriate Healthy:  appears stated age HEENT: cervical spine surgery  Neck supple with no adenopathy JVP normal bilateral   bruits no thyromegaly Lungs rhonchi / exp wheezing  Heart:  S1/S2 SEM  murmur, no rub, gallop or click PMI  normal Abdomen: benighn, BS positve, no tenderness, no AAA no bruit.  No HSM or HJR Distal pulses intact with no bruits No edema Neuro non-focal Skin warm and dry No muscular weakness    Current Outpatient Medications  Medication Sig Dispense Refill   aspirin EC 81 MG tablet Take 1 tablet (81 mg total) by mouth daily.     cholecalciferol (VITAMIN D) 1000 units tablet Take 1,000 Units by mouth daily.     cyclobenzaprine (FLEXERIL) 5 MG tablet Take 5 mg by mouth 3 (three) times daily as needed for muscle spasms.     glimepiride (AMARYL) 2 MG tablet Take 1 tablet by mouth daily.     HYDROcodone-acetaminophen (NORCO/VICODIN) 5-325 MG tablet Take 1-2 tablets by mouth every 4 (four) hours as needed (pain). 40 tablet 0   lisinopril (PRINIVIL,ZESTRIL) 10 MG tablet Take 5 mg by mouth at bedtime.      metFORMIN (GLUCOPHAGE-XR) 500 MG 24 hr tablet Take 2 tablets (1,000 mg total) by mouth 2 (two) times daily. 120 tablet 1   montelukast (SINGULAIR) 10 MG tablet Take 1 tablet by mouth at bedtime.      nitroGLYCERIN (NITROSTAT) 0.4 MG SL tablet Place 1 tablet (0.4 mg total) under the tongue every 5 (five) minutes as needed for chest pain. 25 tablet 3   pravastatin (PRAVACHOL) 40 MG tablet Take 1 tablet (40 mg total) by mouth daily. 30 tablet 1   Testosterone Cypionate 200 MG/ML KIT Inject 100 mg into the muscle every 7 (seven) days.        vitamin B-12 (CYANOCOBALAMIN) 500 MCG tablet Take 500 mcg by mouth daily.     No current facility-administered medications for this visit.    Allergies  Patient has no known allergies.  Electrocardiogram:  02/02/2022 SR rate 74 normal   Assessment and Plan CAD/CABG:  2008  No angina continue medical Rx normal myovue 2014 TTE 07/24/18 normal EF  Chol: labs with primary on statin   Bruit:  1-39% bilateral plaque duplex 07/2020 TIA: not embolic no PAF echo, carotids normal ASA f/u neuro DM:  Improved f/u with endocrine now on jardiance  Vertigo:  F/u ENT consider  meclizine/ diuretic  GI:  history polyps recent colonoscopy ok only noted diverticulosis Murmur:  f/u echo in a year AV sclerosis vs stenosis Sinus:  wheezy this am has inhaler continue antibiotics   F/U in a year   Peter Nishan 

## 2022-02-02 ENCOUNTER — Encounter: Payer: Self-pay | Admitting: Cardiovascular Disease

## 2022-02-02 ENCOUNTER — Ambulatory Visit: Payer: Medicare Other | Admitting: Cardiovascular Disease

## 2022-02-02 VITALS — BP 130/60 | HR 70 | Ht 67.0 in | Wt 159.0 lb

## 2022-02-02 DIAGNOSIS — R0989 Other specified symptoms and signs involving the circulatory and respiratory systems: Secondary | ICD-10-CM

## 2022-02-02 DIAGNOSIS — R011 Cardiac murmur, unspecified: Secondary | ICD-10-CM

## 2022-02-02 DIAGNOSIS — Z951 Presence of aortocoronary bypass graft: Secondary | ICD-10-CM | POA: Diagnosis not present

## 2022-02-02 NOTE — Patient Instructions (Addendum)
Medication Instructions:  Your physician recommends that you continue on your current medications as directed. Please refer to the Current Medication list given to you today.  *If you need a refill on your cardiac medications before your next appointment, please call your pharmacy*  Lab Work: If you have labs (blood work) drawn today and your tests are completely normal, you will receive your results only by: [place] (if you have MyChart) OR A paper copy in the mail If you have any lab test that is abnormal or we need to change your treatment, we will call you to review the results.  Testing/Procedures: Your physician has requested that you have an echocardiogram. Echocardiography is a painless test that uses sound waves to create images of your heart. It provides your doctor with information about the size and shape of your heart and how well your heart's chambers and valves are working. This procedure takes approximately one hour. There are no restrictions for this procedure. Your physician has requested that you have a carotid duplex. This test is an ultrasound of the carotid arteries in your neck. It looks at blood flow through these arteries that supply the brain with blood. Allow one hour for this exam. There are no restrictions or special instructions.   Follow-Up: At [facility], you and your health needs are our priority.  As part of our continuing mission to provide you with exceptional heart care, we have created designated Provider Care Teams.  These Care Teams include your primary Cardiologist (physician) and Advanced Practice Providers (APPs -  Physician Assistants and Nurse Practitioners) who all work together to provide you with the care you need, when you need it.  We recommend signing up for the patient portal called "MyChart".  Sign up information is provided on this After Visit Summary.  MyChart is used to connect with patients for Virtual Visits (Telemedicine).   Patients are able to view lab/test results, encounter notes, upcoming appointments, etc.  Non-urgent messages can be sent to your provider as well.   To learn more about what you can do with MyChart, go to [email or web address].    Your next appointment:   March  The format for your next appointment:   In Person  Provider:   Jenkins Rouge, MD {    Important Information About Sugar

## 2022-06-20 ENCOUNTER — Encounter (HOSPITAL_COMMUNITY): Payer: Self-pay

## 2022-06-20 ENCOUNTER — Encounter (HOSPITAL_BASED_OUTPATIENT_CLINIC_OR_DEPARTMENT_OTHER): Payer: Self-pay | Admitting: Urology

## 2022-06-20 ENCOUNTER — Emergency Department (HOSPITAL_BASED_OUTPATIENT_CLINIC_OR_DEPARTMENT_OTHER): Payer: Medicare Other

## 2022-06-20 ENCOUNTER — Observation Stay (HOSPITAL_BASED_OUTPATIENT_CLINIC_OR_DEPARTMENT_OTHER)
Admission: EM | Admit: 2022-06-20 | Discharge: 2022-06-22 | Disposition: A | Discharge status: Home / Self Care | Payer: Medicare Other | Attending: Internal Medicine | Admitting: Internal Medicine

## 2022-06-20 ENCOUNTER — Inpatient Hospital Stay: Admit: 2022-06-20 | Payer: Medicare Other | Admitting: Internal Medicine

## 2022-06-20 ENCOUNTER — Other Ambulatory Visit: Payer: Self-pay

## 2022-06-20 DIAGNOSIS — E860 Dehydration: Secondary | ICD-10-CM | POA: Insufficient documentation

## 2022-06-20 DIAGNOSIS — K922 Gastrointestinal hemorrhage, unspecified: Secondary | ICD-10-CM | POA: Diagnosis not present

## 2022-06-20 DIAGNOSIS — I10 Essential (primary) hypertension: Secondary | ICD-10-CM | POA: Insufficient documentation

## 2022-06-20 DIAGNOSIS — K625 Hemorrhage of anus and rectum: Secondary | ICD-10-CM

## 2022-06-20 DIAGNOSIS — K5730 Diverticulosis of large intestine without perforation or abscess without bleeding: Secondary | ICD-10-CM | POA: Insufficient documentation

## 2022-06-20 DIAGNOSIS — D125 Benign neoplasm of sigmoid colon: Secondary | ICD-10-CM | POA: Diagnosis not present

## 2022-06-20 DIAGNOSIS — I2510 Atherosclerotic heart disease of native coronary artery without angina pectoris: Secondary | ICD-10-CM | POA: Diagnosis not present

## 2022-06-20 DIAGNOSIS — E119 Type 2 diabetes mellitus without complications: Secondary | ICD-10-CM | POA: Insufficient documentation

## 2022-06-20 DIAGNOSIS — K648 Other hemorrhoids: Secondary | ICD-10-CM | POA: Insufficient documentation

## 2022-06-20 DIAGNOSIS — K649 Unspecified hemorrhoids: Secondary | ICD-10-CM | POA: Diagnosis not present

## 2022-06-20 DIAGNOSIS — Z7982 Long term (current) use of aspirin: Secondary | ICD-10-CM | POA: Diagnosis not present

## 2022-06-20 DIAGNOSIS — E785 Hyperlipidemia, unspecified: Secondary | ICD-10-CM | POA: Diagnosis present

## 2022-06-20 DIAGNOSIS — E782 Mixed hyperlipidemia: Secondary | ICD-10-CM

## 2022-06-20 DIAGNOSIS — Z79899 Other long term (current) drug therapy: Secondary | ICD-10-CM | POA: Insufficient documentation

## 2022-06-20 DIAGNOSIS — N19 Unspecified kidney failure: Secondary | ICD-10-CM | POA: Diagnosis present

## 2022-06-20 DIAGNOSIS — R933 Abnormal findings on diagnostic imaging of other parts of digestive tract: Secondary | ICD-10-CM | POA: Insufficient documentation

## 2022-06-20 DIAGNOSIS — Z8673 Personal history of transient ischemic attack (TIA), and cerebral infarction without residual deficits: Secondary | ICD-10-CM | POA: Diagnosis not present

## 2022-06-20 DIAGNOSIS — Z87891 Personal history of nicotine dependence: Secondary | ICD-10-CM | POA: Insufficient documentation

## 2022-06-20 DIAGNOSIS — D62 Acute posthemorrhagic anemia: Secondary | ICD-10-CM | POA: Diagnosis not present

## 2022-06-20 LAB — COMPREHENSIVE METABOLIC PANEL
ALT: 15 U/L (ref 0–44)
AST: 17 U/L (ref 15–41)
Albumin: 3.9 g/dL (ref 3.5–5.0)
Alkaline Phosphatase: 105 U/L (ref 38–126)
Anion gap: 11 (ref 5–15)
BUN: 31 mg/dL — ABNORMAL HIGH (ref 8–23)
CO2: 21 mmol/L — ABNORMAL LOW (ref 22–32)
Calcium: 8.8 mg/dL — ABNORMAL LOW (ref 8.9–10.3)
Chloride: 100 mmol/L (ref 98–111)
Creatinine, Ser: 0.73 mg/dL (ref 0.61–1.24)
GFR, Estimated: >60 mL/min (ref >60)
Glucose, Bld: 267 mg/dL — ABNORMAL HIGH (ref 70–99)
Potassium: 4.5 mmol/L (ref 3.5–5.1)
Sodium: 132 mmol/L — ABNORMAL LOW (ref 135–145)
Total Bilirubin: 0.4 mg/dL (ref 0.3–1.2)
Total Protein: 6.8 g/dL (ref 6.5–8.1)

## 2022-06-20 LAB — CBC WITH DIFFERENTIAL/PLATELET
Abs Immature Granulocytes: 0.03 10*3/uL (ref 0.00–0.07)
Basophils Absolute: 0 10*3/uL (ref 0.0–0.1)
Basophils Relative: 0 %
Eosinophils Absolute: 0.1 10*3/uL (ref 0.0–0.5)
Eosinophils Relative: 1 %
HCT: 35 % — ABNORMAL LOW (ref 39.0–52.0)
Hemoglobin: 12 g/dL — ABNORMAL LOW (ref 13.0–17.0)
Immature Granulocytes: 0 %
Lymphocytes Relative: 26 %
Lymphs Abs: 2.4 10*3/uL (ref 0.7–4.0)
MCH: 31.5 pg (ref 26.0–34.0)
MCHC: 34.3 g/dL (ref 30.0–36.0)
MCV: 91.9 fL (ref 80.0–100.0)
Monocytes Absolute: 0.5 10*3/uL (ref 0.1–1.0)
Monocytes Relative: 6 %
Neutro Abs: 6.2 10*3/uL (ref 1.7–7.7)
Neutrophils Relative %: 67 %
Platelets: 270 10*3/uL (ref 150–400)
RBC: 3.81 MIL/uL — ABNORMAL LOW (ref 4.22–5.81)
RDW: 12.9 % (ref 11.5–15.5)
WBC: 9.2 10*3/uL (ref 4.0–10.5)
nRBC: 0 % (ref 0.0–0.2)

## 2022-06-20 LAB — HEMOGLOBIN AND HEMATOCRIT, BLOOD
HCT: 32.3 % — ABNORMAL LOW (ref 39.0–52.0)
Hemoglobin: 10.9 g/dL — ABNORMAL LOW (ref 13.0–17.0)

## 2022-06-20 LAB — OCCULT BLOOD X 1 CARD TO LAB, STOOL: Fecal Occult Bld: POSITIVE — AB

## 2022-06-20 MED ORDER — ACETAMINOPHEN 650 MG RE SUPP: 650.0000 mg | Freq: Four times a day (QID) | RECTAL | Status: DC | PRN

## 2022-06-20 MED ORDER — IOHEXOL 300 MG/ML  SOLN
100.0000 mL | Freq: Once | INTRAMUSCULAR | Status: AC | PRN
  Administered 2022-06-20: 100 mL via INTRAVENOUS

## 2022-06-20 MED ORDER — LACTATED RINGERS IV BOLUS
500.0000 mL | Freq: Once | INTRAVENOUS | Status: AC
  Administered 2022-06-20: 500 mL via INTRAVENOUS

## 2022-06-20 MED ORDER — ACETAMINOPHEN 325 MG PO TABS: 650.0000 mg | ORAL_TABLET | Freq: Four times a day (QID) | ORAL | Status: DC | PRN

## 2022-06-20 NOTE — ED Notes (Signed)
Report given to [facility]. 

## 2022-06-20 NOTE — ED Notes (Signed)
Pt requested something to eat/drink. Spoke with PA. Pt is ok for clear liquids. Provided pt with some ginger ale and some chicken broth.

## 2022-06-20 NOTE — ED Provider Notes (Signed)
[place] EMERGENCY DEPARTMENT Provider Note   CSN: [id number] Arrival date & time: 06/20/22  1254     History  Chief Complaint  Patient presents with   Rectal Bleeding    Jason Bailey is a 72 y.o. male.  HPI 72 year old male with a history of DM type II, hyperlipidemia, hypertension, osteoarthritis, TIA, MI presents to the ER with concerns for rectal bleeding since Sunday.  He reports fairly significant amount on Sunday, and his stools have appeared dark.  He is also noted some bright red blood as well.  The bleeding has slowed down a bit but now he feels fatigued.  He denies any dizziness.  No clots.  No prior history of GI bleed.  He states that he had a colonoscopy last year and was told that he may have some bleeding afterwards.  He said about 2 weeks later after his colonoscopy he had 3 days of small amount of bright red blood but then this resolved on its own.  He takes 81 mg aspirin but no other anticoagulation.  Endorses some mild abdominal pain.  He also notes that for the last month or so every time he eats he gets very bloated and has a lot of gas.  No nausea or vomiting.  No fevers.  Per chart review, he is followed by Dr. Marian Sorrow GI at [facility] Medications Prior to Admission medications   Medication Sig Start Date End Date Taking? Authorizing Provider  aspirin EC 81 MG tablet Take 1 tablet (81 mg total) by mouth daily. 08/12/19   Josue Hector, MD  cholecalciferol (VITAMIN D) 1000 units tablet Take 1,000 Units by mouth daily.    [provider]  cyclobenzaprine (FLEXERIL) 5 MG tablet Take 5 mg by mouth 3 (three) times daily as needed for muscle spasms. 03/23/19   [provider]  glimepiride (AMARYL) 2 MG tablet Take 1 tablet by mouth daily. 05/11/19 02/02/22  [provider]  HYDROcodone-acetaminophen (NORCO/VICODIN) 5-325 MG tablet Take 1-2 tablets by mouth every 4 (four) hours as needed (pain). 03/27/19   Jovita Gamma, MD   lisinopril (PRINIVIL,ZESTRIL) 10 MG tablet Take 5 mg by mouth at bedtime.     [provider]  metFORMIN (GLUCOPHAGE-XR) 500 MG 24 hr tablet Take 2 tablets (1,000 mg total) by mouth 2 (two) times daily. 04/06/19   Renato Shin, MD  montelukast (SINGULAIR) 10 MG tablet Take 1 tablet by mouth at bedtime.  07/07/18   [provider]  nitroGLYCERIN (NITROSTAT) 0.4 MG SL tablet Place 1 tablet (0.4 mg total) under the tongue every 5 (five) minutes as needed for chest pain. 01/12/13   Josue Hector, MD  pravastatin (PRAVACHOL) 40 MG tablet Take 1 tablet (40 mg total) by mouth daily. 06/14/17   Rai, Vernelle Emerald, MD  Testosterone Cypionate 200 MG/ML KIT Inject 100 mg into the muscle every 7 (seven) days.     [provider]  vitamin B-12 (CYANOCOBALAMIN) 500 MCG tablet Take 500 mcg by mouth daily.    [provider]      Allergies    Patient has no known allergies.    Review of Systems   Review of Systems Ten systems reviewed and are negative for acute change, except as noted in the HPI.   Physical Exam Updated Vital Signs BP (!) 136/57   Pulse 65   Temp 98.2 F (36.8 C) (Oral)   Resp 13   Ht '5\' 7"'$  (1.702 m)  Wt 72.1 kg   SpO2 93%   BMI 24.90 kg/m  Physical Exam Vitals and nursing note reviewed.  Constitutional:      General: He is not in acute distress.    Appearance: He is well-developed.  HENT:     Head: Normocephalic and atraumatic.  Eyes:     Conjunctiva/sclera: Conjunctivae normal.  Cardiovascular:     Rate and Rhythm: Normal rate and regular rhythm.     Heart sounds: No murmur heard. Pulmonary:     Effort: Pulmonary effort is normal. No respiratory distress.     Breath sounds: Normal breath sounds.  Abdominal:     Palpations: Abdomen is soft.     Tenderness: There is abdominal tenderness.     Comments: Mild generalized tenderness with visible abdominal bloating  Genitourinary:    Comments: Exam performed with RN at bedside.  No  external hemorrhoids noted.  Stool is melanotic upon inspection. Musculoskeletal:        General: No swelling.     Cervical back: Neck supple.  Skin:    General: Skin is warm and dry.     Capillary Refill: Capillary refill takes less than 2 seconds.  Neurological:     Mental Status: He is alert.  Psychiatric:        Mood and Affect: Mood normal.     ED Results / Procedures / Treatments   Labs (all labs ordered are listed, but only abnormal results are displayed) Labs Reviewed  CBC WITH DIFFERENTIAL/PLATELET - Abnormal; Notable for the following components:      Result Value   RBC 3.81 (*)    Hemoglobin 12.0 (*)    HCT 35.0 (*)    All other components within normal limits  COMPREHENSIVE METABOLIC PANEL - Abnormal; Notable for the following components:   Sodium 132 (*)    CO2 21 (*)    Glucose, Bld 267 (*)    BUN 31 (*)    Calcium 8.8 (*)    All other components within normal limits  OCCULT BLOOD X 1 CARD TO LAB, STOOL - Abnormal; Notable for the following components:   Fecal Occult Bld POSITIVE (*)    All other components within normal limits    EKG None  Radiology CT ABDOMEN PELVIS W CONTRAST  Result Date: 06/20/2022 CLINICAL DATA:  Epigastric pain.  Rectal bleeding EXAM: CT ABDOMEN AND PELVIS WITH CONTRAST TECHNIQUE: Multidetector CT imaging of the abdomen and pelvis was performed using the standard protocol following bolus administration of intravenous contrast. RADIATION DOSE REDUCTION: This exam was performed according to the departmental dose-optimization program which includes automated exposure control, adjustment of the mA and/or kV according to patient size and/or use of iterative reconstruction technique. CONTRAST:  132m OMNIPAQUE IOHEXOL 300 MG/ML  SOLN COMPARISON:  None Available. FINDINGS: Lower chest: No acute abnormality. Hepatobiliary: Mildly decreased attenuation of the hepatic parenchyma. Subcentimeter hyperenhancing focus at the right hepatic dome is  likely a flash filling hemangioma or vascular shunt, but is too small to definitively characterize. Otherwise, no focal liver abnormality. Multiple small stones within the gallbladder. No wall thickening or pericholecystic inflammatory changes are seen. No biliary dilatation. Pancreas: Unremarkable. No pancreatic ductal dilatation or surrounding inflammatory changes. Spleen: Normal in size without focal abnormality. Adrenals/Urinary Tract: 8 mm right adrenal gland nodule. 8 mm left adrenal gland nodule. These are probably benign and do not require follow-up imaging. Kidneys enhance symmetrically. No solid renal mass. 3 mm stone within the inferior pole of the right kidney.  No left-sided renal calculi. No hydronephrosis. Urinary bladder is within normal limits for the degree of distension. Stomach/Bowel: Moderate-sized hiatal hernia. Stomach otherwise within normal limits. No abnormally dilated loops of bowel. There is a short segment of circumferential wall thickening of the mid ascending colon (series 2, image 38; series 5, image 56) with luminal narrowing. No associated pericolonic fat stranding or fluid. A normal appendix is seen in the right lower quadrant. No additional sites of bowel wall thickening. There is diverticulosis of the descending and sigmoid colon. No pericolonic inflammatory changes. Vascular/Lymphatic: Aortic atherosclerosis. No enlarged abdominal or pelvic lymph nodes. 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 Other: No free fluid. No abdominopelvic fluid collection. No pneumoperitoneum. Small fat containing right inguinal hernia. Musculoskeletal: No acute or significant osseous findings. 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 IMPRESSION: 1. Short segment of circumferential wall thickening of the mid ascending colon with luminal narrowing. No associated pericolonic fat stranding or fluid. Although this could potentially represent focal  peristalsis, findings are most concerning for a colonic neoplasm. Further evaluation with colonoscopy is recommended. 2. Colonic diverticulosis without evidence of acute diverticulitis. 3. Cholelithiasis without evidence of acute cholecystitis. 4. Nonobstructing 3 mm right renal stone. 5. Moderate-sized hiatal hernia. 6. Small fat containing right inguinal hernia. 7. Aortic Atherosclerosis (ICD10-I70.0). Electronically Signed   By: Davina Poke D.O.   On: 06/20/2022 16:14    Procedures Procedures    Medications Ordered in ED Medications  lactated ringers bolus 500 mL (0 mLs Intravenous Stopped 06/20/22 1618)  iohexol (OMNIPAQUE) 300 MG/ML solution 100 mL (100 mLs Intravenous Contrast Given 06/20/22 1542)    ED Course/ Medical Decision Making/ A&P                           Medical Decision Making Amount and/or Complexity of Data Reviewed Labs: ordered. Radiology: ordered.  Risk Prescription drug management.   INITIAL IMPRESSION / ASSESSMENT AND PLAN / ED COURSE   Pertinent labs & imaging results that were available during my care of the patient were reviewed by me and considered in my medical decision making (see chart for details).   This patient is Presenting for Evaluation of GI bleed, which does require a range of treatment options, and is a complaint that involves a high risk of morbidity and mortality.   The Differential Diagnoses includes but is not exclusive to bleeding peptic ulcer, hemorrhoids, diverticulitis, arterial bleed     I did obtain Additional Historical Information from wife at bedside   I decided to review pertinent External Data, and in summary, patient had a colonoscopy in 2023, noted diverticulosis throughout the entire colon and history of polyps. Clinical Laboratory Tests ordered, reviewed, and interpreted by me  -CBC with a hemoglobin of 12.  Per chart review in care everywhere, hemoglobin of 15.2 as of June 2023 -CMP with mild hyponatremia of 132,  glucose of 267, CO2 of 21, no anion gap acidosis, normal LFTs and bilirubin. -Point-of-care fecal occult positive for blood  Radiologic Tests Ordered,  I independently interpreted the images and agree with radiology interpretation.   CT of the abdomen    Consult complete with Dr. Watt Climes w/ GI   Medical Decision Making: Summary:  72 year old presenting with 4 days of GI bleeding.  He has a 3 point hemoglobin drop from his baseline.  He also endorses feeling weak and at times dizzy.  He has melanotic stool on exam.  His CT is concerning for possible colonic neoplasm.  He did have a colonoscopy in August of this year with Dr. Marian Sorrow with [facility].  Given his hemoglobin drop and also not having direct contact with [facility], I believe he would benefit from admission for close observation and colonoscopy.  I discussed this with Dr. Watt Climes who is agreeable to see the patient at [facility]. He recommends clear liquids tonight.  I discussed admission with the patient who is agreeable with this.  Consulted hospitalist team for admission.   Reevaluation with update and discussion with patient who is agreeable to admission  Considered admission, patient would benefit from close observation and repeat colonoscopy Disposition: Admission  Final Clinical Impression(s) / ED Diagnoses Final diagnoses:  Rectal bleeding    Rx / DC Orders ED Discharge Orders     None         Lyndel Safe 06/20/22 1755    Wyvonnia Dusky, MD 06/20/22 2045

## 2022-06-20 NOTE — ED Notes (Signed)
Called [facility] for tranfers @:6:18pm

## 2022-06-20 NOTE — ED Triage Notes (Signed)
Pt states has been having stomach trouble x 1 month, states bright blood since Sunday morning, states bleeding has slowed down but now feels fatigued  Started after eating spicy food Sunday

## 2022-06-20 NOTE — ED Notes (Signed)
Pt transferred to [facility] via [facility]

## 2022-06-20 NOTE — H&P (Signed)
History and Physical      Jason Bailey ZOX:[id number] DOB: [birth year] DOA: 06/20/2022  PCP: Tacey Ruiz, FNP  Patient coming from: home   I have personally briefly reviewed patient's old medical records in [facility]  Chief Complaint: bright red blood per rectum  HPI: Jason Bailey is a 72 y.o. male with medical history significant for type 2 diabetes mellitus, essential hypertension, hyperlipidemia who is admitted to [facility] on 06/20/2022 by way of transfer from [facility] emergency department with suspected acute lower gastrointestinal bleed resulting in acute blood loss anemia after presenting from home to the latter facility complaining of bright red blood per rectum.  The patient reports intermittent bright red blood per rectum over the last 4 days, starting on Sunday, 06/17/2022.  Over that time, he has noted approximately 1 episode of hematochezia per day over that timeframe, most recent episode occurring just prior to presenting to [facility] emergency department.  Denies any associated melena.  She denies any associated abdominal discomfort.  No recent nausea, vomiting, hematemesis.  No recent subjective fever, chills, rigors or generalized myalgias.  No recent trauma.  No personal history of malignancy.  He reports that he underwent routine scheduled colonoscopy in August 2023, representing his most recent colonoscopy.  Approximately 1 week after completing this colonoscopy, he had noticed some painless bright red blood per rectum, which was self-limited, and noted no interval Blood per rectum until development of such on Sunday, 06/17/2022, as above.  Denies any known history of GERD.  Is on daily baby aspirin as an outpatient, but otherwise no additional blood thinners.  No routine use of NSAIDs.  Denies any recurrent or recent consumption of alcohol nor any known history of underlying liver disease.  Per chart review, most recent prior  hemoglobin data points noted to be 14.7 in June 2022 followed by 15.2 in June 2023.     [facility] ED Course:  Vital signs in the ED were notable for the following: Afebrile; heart rates in the 60s 70s; systolic blood pressures in the 1 teens to 140s; respiratory rate 15-20, oxygen saturation 96 to 100% on room air.  Labs were notable for the following: CMP notable for sodium 132, which corrects to approximately 134.5 when taking into account, hyperglycemia, bicarbonate 21, BUN 31, creatinine 0.73, BUN to creatinine ratio greater than 20, glucose 263, liver enzymes within normal limits.  CBC notable for some count 9200, hemoglobin 12.06 with normocytic/normochromic properties as well as nonelevated RDW.  Fecal occult blood test was found to be positive for  Imaging and additional notable ED work-up: CT abdomen/pelvis with contrast showed a short segment of circumferential wall thickening at the mid ascending colon associated with luminal narrowing without pericolonic fat stranding, potentially representing focal peristalsis versus colonic neoplasm, also showing evidence of diverticulosis without evidence of acute diverticulitis, no evidence of abscess or any evidence of bowel perforation.  EDP at [facility] contacted on-call [facility] gastroenterology, who will formally consult and see the patient in the morning, 06/21/2022.  While in the ED, the following were administered: Lactated Ringer status 500 cc bolus.  Subsequently, the patient was admitted to medicine for further evaluation management of suspected acute lower gastrointestinal bleed, complicated by acute blood loss anemia.    Review of Systems: As per HPI otherwise 10 point review of systems negative.   Past Medical History:  Diagnosis Date   Chest pain  Coronary artery disease    Diabetes mellitus    Heart attack (HCC)    approx. 2009   Hyperlipidemia    Hypertension    Malaise and fatigue     Osteoarthritis    Right foot drop    Right shoulder pain    Vision abnormalities     Past Surgical History:  Procedure Laterality Date   ANTERIOR CERVICAL DECOMP/DISCECTOMY FUSION N/A 03/26/2019   Procedure: ANTERIOR CERVICAL DECOMPRESSION/DISCECTOMY FUSION ONE LEVEL, CERVICAL FIVE - CERVICAL SIX;  Surgeon: Shirlean Kelly, MD;  Location: [facility] OR;  Service: Neurosurgery;  Laterality: N/A;  ANTERIOR CERVICAL DECOMPRESSION/DISCECTOMY FUSION ONE LEVEL, CERVICAL FIVE - CERVICAL SIX    CAD     disease status post CABG x 4 on 02/14/2007   KNEE SURGERY      Social History:  reports that he has quit smoking. He has never used smokeless tobacco. He reports that he does not drink alcohol and does not use drugs.   No Known Allergies  Family History  Problem Relation Age of Onset   Breast cancer Mother    Liver cancer Father    Renal cancer Sister    Diabetes Maternal Grandfather     Family history reviewed and not pertinent    Prior to Admission medications   Medication Sig Start Date End Date Taking? Authorizing Provider  aspirin EC 81 MG tablet Take 1 tablet (81 mg total) by mouth daily. 08/12/19   Wendall Stade, MD  cholecalciferol (VITAMIN D) 1000 units tablet Take 1,000 Units by mouth daily.    [provider]  cyclobenzaprine (FLEXERIL) 5 MG tablet Take 5 mg by mouth 3 (three) times daily as needed for muscle spasms. 03/23/19   [provider]  glimepiride (AMARYL) 2 MG tablet Take 1 tablet by mouth daily. 05/11/19 02/02/22  [provider]  HYDROcodone-acetaminophen (NORCO/VICODIN) 5-325 MG tablet Take 1-2 tablets by mouth every 4 (four) hours as needed (pain). 03/27/19   Shirlean Kelly, MD  lisinopril (PRINIVIL,ZESTRIL) 10 MG tablet Take 5 mg by mouth at bedtime.     [provider]  metFORMIN (GLUCOPHAGE-XR) 500 MG 24 hr tablet Take 2 tablets (1,000 mg total) by mouth 2 (two) times daily. 04/06/19   Romero Belling, MD  montelukast (SINGULAIR)  10 MG tablet Take 1 tablet by mouth at bedtime.  07/07/18   [provider]  nitroGLYCERIN (NITROSTAT) 0.4 MG SL tablet Place 1 tablet (0.4 mg total) under the tongue every 5 (five) minutes as needed for chest pain. 01/12/13   Wendall Stade, MD  pravastatin (PRAVACHOL) 40 MG tablet Take 1 tablet (40 mg total) by mouth daily. 06/14/17   Rai, Delene Ruffini, MD  Testosterone Cypionate 200 MG/ML KIT Inject 100 mg into the muscle every 7 (seven) days.     [provider]  vitamin B-12 (CYANOCOBALAMIN) 500 MCG tablet Take 500 mcg by mouth daily.    [provider]     Objective    Physical Exam: Vitals:   06/20/22 1757 06/20/22 1929 06/20/22 2000 06/20/22 2210  BP: (!) 126/52 129/63 (!) 129/59 (!) 139/59  Pulse: 70 74 64 70  Resp: 16 14 16 20   Temp: 98.1 F (36.7 C) 98.1 F (36.7 C)  97.6 F (36.4 C)  TempSrc: Oral Oral  Oral  SpO2: 100% 97% 98% 98%  Weight:      Height:        General: appears to be stated age; alert, oriented Skin: warm, dry, no  rash Head:  AT/[place] Mouth:  Oral mucosa membranes appear moist, normal dentition Neck: supple; trachea midline Heart:  RRR; did not appreciate any M/R/G Lungs: CTAB, did not appreciate any wheezes, rales, or rhonchi Abdomen: + BS; soft, ND, NT Vascular: 2+ pedal pulses b/l; 2+ radial pulses b/l Extremities: no peripheral edema, no muscle wasting Neuro: strength and sensation intact in upper and lower extremities b/l    Labs on Admission: I have personally reviewed following labs and imaging studies  CBC: Recent Labs  Lab 06/20/22 1320  WBC 9.2  NEUTROABS 6.2  HGB 12.0*  HCT 35.0*  MCV 91.9  PLT 270   Basic Metabolic Panel: Recent Labs  Lab 06/20/22 1320  NA 132*  K 4.5  CL 100  CO2 21*  GLUCOSE 267*  BUN 31*  CREATININE 0.73  CALCIUM 8.8*   GFR: Estimated Creatinine Clearance: 81.5 mL/min (by C-G formula based on SCr of 0.73 mg/dL). Liver Function Tests: Recent Labs  Lab 06/20/22 1320   AST 17  ALT 15  ALKPHOS 105  BILITOT 0.4  PROT 6.8  ALBUMIN 3.9   No results for input(s): "LIPASE", "AMYLASE" in the last 168 hours. No results for input(s): "AMMONIA" in the last 168 hours. Coagulation Profile: No results for input(s): "INR", "PROTIME" in the last 168 hours. Cardiac Enzymes: No results for input(s): "CKTOTAL", "CKMB", "CKMBINDEX", "TROPONINI" in the last 168 hours. BNP (last 3 results) No results for input(s): "PROBNP" in the last 8760 hours. HbA1C: No results for input(s): "HGBA1C" in the last 72 hours. CBG: No results for input(s): "GLUCAP" in the last 168 hours. Lipid Profile: No results for input(s): "CHOL", "HDL", "LDLCALC", "TRIG", "CHOLHDL", "LDLDIRECT" in the last 72 hours. Thyroid Function Tests: No results for input(s): "TSH", "T4TOTAL", "FREET4", "T3FREE", "THYROIDAB" in the last 72 hours. Anemia Panel: No results for input(s): "VITAMINB12", "FOLATE", "FERRITIN", "TIBC", "IRON", "RETICCTPCT" in the last 72 hours. Urine analysis:    Component Value Date/Time   COLORURINE YELLOW 02/12/2007 2301   APPEARANCEUR CLOUDY (A) 02/12/2007 2301   LABSPEC 1.044 (H) 02/12/2007 2301   PHURINE 5.5 02/12/2007 2301   GLUCOSEU 500 (A) 02/12/2007 2301   HGBUR NEGATIVE 02/12/2007 2301   BILIRUBINUR NEGATIVE 02/12/2007 2301   KETONESUR NEGATIVE 02/12/2007 2301   PROTEINUR NEGATIVE 02/12/2007 2301   UROBILINOGEN 1.0 02/12/2007 2301   NITRITE NEGATIVE 02/12/2007 2301   LEUKOCYTESUR  02/12/2007 2301    NEGATIVE MICROSCOPIC NOT DONE ON URINES WITH NEGATIVE PROTEIN, BLOOD, LEUKOCYTES, NITRITE, OR GLUCOSE <1000 mg/dL.    Radiological Exams on Admission: CT ABDOMEN PELVIS W CONTRAST  Result Date: 06/20/2022 CLINICAL DATA:  Epigastric pain.  Rectal bleeding EXAM: CT ABDOMEN AND PELVIS WITH CONTRAST TECHNIQUE: Multidetector CT imaging of the abdomen and pelvis was performed using the standard protocol following bolus administration of intravenous contrast. RADIATION  DOSE REDUCTION: This exam was performed according to the departmental dose-optimization program which includes automated exposure control, adjustment of the mA and/or kV according to patient size and/or use of iterative reconstruction technique. CONTRAST:  OMNIPAQUE IOHEXOL 300 MG/ML  SOLN COMPARISON:  None Available. FINDINGS: Lower chest: No acute abnormality. Hepatobiliary: Mildly decreased attenuation of the hepatic parenchyma. Subcentimeter hyperenhancing focus at the right hepatic dome is likely a flash filling hemangioma or vascular shunt, but is too small to definitively characterize. Otherwise, no focal liver abnormality. Multiple small stones within the gallbladder. No wall thickening or pericholecystic inflammatory changes are seen. No biliary dilatation. Pancreas: Unremarkable. No pancreatic ductal dilatation or surrounding inflammatory changes.  Spleen: Normal in size without focal abnormality. Adrenals/Urinary Tract: 8 mm right adrenal gland nodule. 8 mm left adrenal gland nodule. These are probably benign and do not require follow-up imaging. Kidneys enhance symmetrically. No solid renal mass. 3 mm stone within the inferior pole of the right kidney. No left-sided renal calculi. No hydronephrosis. Urinary bladder is within normal limits for the degree of distension. Stomach/Bowel: Moderate-sized hiatal hernia. Stomach otherwise within normal limits. No abnormally dilated loops of bowel. There is a short segment of circumferential wall thickening of the mid ascending colon (series 2, image 38; series 5, image 56) with luminal narrowing. No associated pericolonic fat stranding or fluid. A normal appendix is seen in the right lower quadrant. No additional sites of bowel wall thickening. There is diverticulosis of the descending and sigmoid colon. No pericolonic inflammatory changes. Vascular/Lymphatic: Aortic atherosclerosis. No enlarged abdominal or pelvic lymph nodes. Reproductive: Brachytherapy  seeds at the prostate bed. Other: No free fluid. No abdominopelvic fluid collection. No pneumoperitoneum. Small fat containing right inguinal hernia. Musculoskeletal: No acute or significant osseous findings. 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 IMPRESSION: 1. Short segment of circumferential wall thickening of the mid ascending colon with luminal narrowing. No associated pericolonic fat stranding or fluid. Although this could potentially represent focal peristalsis, findings are most concerning for a colonic neoplasm. Further evaluation with colonoscopy is recommended. 2. Colonic diverticulosis without evidence of acute diverticulitis. 3. Cholelithiasis without evidence of acute cholecystitis. 4. Nonobstructing 3 mm right renal stone. 5. Moderate-sized hiatal hernia. 6. Small fat containing right inguinal hernia. 7. Aortic Atherosclerosis (ICD10-I70.0). Electronically Signed   By: Duanne Guess D.O.   On: 06/20/2022 16:14      Assessment/Plan    Principal Problem:   Lower GI bleed Active Problems:   DM2 (diabetes mellitus, type 2) (HCC)   HLD (hyperlipidemia)   Essential hypertension   Acute blood loss anemia   Dehydration   Acute prerenal azotemia      #) Acute lower GI bleed: diagnosis on the basis of 4 days of intermittent hematochezia, and with DRE revealing Hemoccult positive stool.  Given 3 to 4-day duration of hematochezia, presenting relative hemodynamic stability appears to further suggest a lower gastrointestinal source.  Also a/w evidence of acute blood loss anemia, with presenting Hgb noted to be 12.0 relative to baseline I discussed with 15, as further detailed below.   Aside from daily baby aspirin, the patient has not any additional blood thinners as an outpatient. No reported history of alcohol abuse. No known history of liver disease to warrant SBP prophylaxis. INR is currently pending. Differential includes diverticular bleed,  particular given evidence of diverticulosis on CT scan, colorectal malignancy, bleeding colonic polyps, AVM's, hemorrhoids.  Patient appears hemodynamically stable with normotensive/non-tachycardic vital signs thus far, and is currently asymptomatic.   Given suspected lower GI source, there does not appear to be an indication for initiation of Protonix drip. EDP consulted on-call [facility] gastroenterologist, with additional recs pending at this time. .      Plan: NPO. Refraining from pharmacologic DVT prophylaxis. Monitor on telemetry. Monitor continuous pulse-ox. Maintain at least 2 large bore IV's. Check INR. Q4H H&H's have been ordered through 9 AM on 06/21/2022. Will closely monitor these ensuing Hgb levels and correlate these data points with the patient's overall clinical picture including vital signs to determine need for subsequent transfusion.  Formal GI consultation, as above.  Hold home aspirin for now.  Type and screen ordered.  Gentle continuous NS at 75 cc/h x 10 hours.           #) Acute blood loss anemia: in the setting of suspected acute lower GI bleed, as above, presenting Hgb noted to be 12, which is relative to apparent baseline hemoglobin of approximately 15.0, with most recent prior hemoglobin noted to be 15.2 in June 2023.  At this time, patient appears hemodynamically stable and asymptomatic, as further described above.  Normocytic/normochromic findings associated today's labs also appear to suggest a more acute timeframe of onset.   Plan: work-up and management for presenting suspected acute lower GI bleed, as above, including close monitoring of Q4H H&H's, with clinical evaluation for determination of need for blood transfusion, as further described above. Monitor on telemetry. Monitor continuous pulse-ox. NPO. Refraining from pharmacologic DVT prophylaxis. Check INR in the morning. Add on the following to initial lab specimen collected in the ED today: total iron, TIBC,  ferritin, B12, folic acid level, reticulocyte count, reticulocyte count.  Hold on aspirin for now.  GI consulted, as above, with additional recommendations pending at this time.              #) Dehydration: Clinical suspicion for such, including the appearance of dry oral mucous membranes as well as laboratory findings notable for acute prerenal azotemia. Appears to be in the setting of recent decline in oral intake after noting bright red blood per rectum 3 to 4 days ago.  No e/o associated hypotension.  Of note, in terms of the patient's acute prerenal azotemia, suspected is truly on the basis of intravascular depletion as opposed to representing an elevation in BUN as a consequence acute upper gastrointestinal bleed, given no evidence of upper source of presenting acute gastrointestinal bleed, as further detailed above   Plan: Monitor strict I's and O's.  Daily weights.  Repeat CMP in the morning. IVF's in form of NS at 75 cc/h x 10 hours..  Further evaluation management of suspected acute lower gastrointestinal bleed complicated by acute blood loss anemia, as further detailed above.           #) Type 2 Diabetes Mellitus: documented history of such. Home insulin regimen: None. Home oral hypoglycemic agents: Metformin, glimepiride. presenting blood sugar: 263. Most recent A1c noted to be 7.8% in August 2020.   Plan: accuchecks 6 hours with low dose SSI. hold home oral hypoglycemic agents during this hospitalization.             #) Essential Hypertension: documented h/o such, with outpatient antihypertensive regimen including lisinopril.  SBP's in the ED today: Normotensive.   Plan: Close monitoring of subsequent BP via routine VS. in the context of presenting suspected acute lower gastrointestinal bleed complicated by acute blood loss anemia, will hold home lisinopril for now.              #) Hyperlipidemia: documented h/o such. On pravastatin as outpatient.    Plan: Hold home statin in the setting of suspected presenting acute lower gastrointestinal bleed.        DVT prophylaxis: SCD's   Code Status: Full code Family Communication: none Disposition Plan: Per Rounding Team Consults called: EDP at [facility] contacted on-call [facility] gastroenterology, with plan for formal consultation in the morning, as further detailed above;  Admission status: Observation    I SPENT GREATER THAN 75  MINUTES IN CLINICAL CARE TIME/MEDICAL DECISION-MAKING IN COMPLETING THIS ADMISSION.     Chaney Born Jeslynn Hollander DO [facility] [employer] -  7AM   06/20/2022, 10:23 PM

## 2022-06-20 NOTE — ED Notes (Signed)
Attempted to call report to nurse, no answer

## 2022-06-20 NOTE — ED Notes (Signed)
Patient transported to CT 

## 2022-06-20 NOTE — Plan of Care (Signed)
72 year old male with H/O DM,HLD,HTN,TIA ,diverticulosiswho came with H/O 3 days of dark/red stool.Takes aspirin '81mg'$  daily. hemoglobin  of 12 on presentation.Also reported mild abdominal pain,bloating.CT was concerning for colonic neoplasm.Case was discussed with Dr Abelina Bachelor GI shd see the patient for colonoscopy when he comes to [facility]

## 2022-06-21 ENCOUNTER — Encounter (HOSPITAL_COMMUNITY): Payer: Self-pay | Admitting: Internal Medicine

## 2022-06-21 DIAGNOSIS — K922 Gastrointestinal hemorrhage, unspecified: Secondary | ICD-10-CM | POA: Diagnosis not present

## 2022-06-21 DIAGNOSIS — E860 Dehydration: Secondary | ICD-10-CM | POA: Diagnosis present

## 2022-06-21 DIAGNOSIS — D62 Acute posthemorrhagic anemia: Secondary | ICD-10-CM | POA: Diagnosis present

## 2022-06-21 DIAGNOSIS — N19 Unspecified kidney failure: Secondary | ICD-10-CM | POA: Diagnosis present

## 2022-06-21 LAB — CBC WITH DIFFERENTIAL/PLATELET
Abs Immature Granulocytes: 0.03 K/uL (ref 0.00–0.07)
Basophils Absolute: 0.1 K/uL (ref 0.0–0.1)
Basophils Relative: 1 %
Eosinophils Absolute: 0.1 K/uL (ref 0.0–0.5)
Eosinophils Relative: 1 %
HCT: 34.4 % — ABNORMAL LOW (ref 39.0–52.0)
Hemoglobin: 11.2 g/dL — ABNORMAL LOW (ref 13.0–17.0)
Immature Granulocytes: 0 %
Lymphocytes Relative: 22 %
Lymphs Abs: 1.5 K/uL (ref 0.7–4.0)
MCH: 31.7 pg (ref 26.0–34.0)
MCHC: 32.6 g/dL (ref 30.0–36.0)
MCV: 97.5 fL (ref 80.0–100.0)
Monocytes Absolute: 0.5 K/uL (ref 0.1–1.0)
Monocytes Relative: 6 %
Neutro Abs: 4.9 K/uL (ref 1.7–7.7)
Neutrophils Relative %: 70 %
Platelets: 224 K/uL (ref 150–400)
RBC: 3.53 MIL/uL — ABNORMAL LOW (ref 4.22–5.81)
RDW: 13 % (ref 11.5–15.5)
WBC: 7 K/uL (ref 4.0–10.5)
nRBC: 0 % (ref 0.0–0.2)

## 2022-06-21 LAB — GLUCOSE, CAPILLARY
Glucose-Capillary: 113 mg/dL — ABNORMAL HIGH (ref 70–99)
Glucose-Capillary: 132 mg/dL — ABNORMAL HIGH (ref 70–99)
Glucose-Capillary: 148 mg/dL — ABNORMAL HIGH (ref 70–99)
Glucose-Capillary: 169 mg/dL — ABNORMAL HIGH (ref 70–99)

## 2022-06-21 LAB — PROTIME-INR
INR: 1.1 (ref 0.8–1.2)
Prothrombin Time: 14 seconds (ref 11.4–15.2)

## 2022-06-21 LAB — HEMOGLOBIN A1C
Hgb A1c MFr Bld: 7.7 % — ABNORMAL HIGH (ref 4.8–5.6)
Mean Plasma Glucose: 174.29 mg/dL

## 2022-06-21 LAB — IRON AND TIBC
Iron: 80 ug/dL (ref 45–182)
Saturation Ratios: 23 % (ref 17.9–39.5)
TIBC: 349 ug/dL (ref 250–450)
UIBC: 269 ug/dL

## 2022-06-21 LAB — COMPREHENSIVE METABOLIC PANEL
ALT: 17 U/L (ref 0–44)
AST: 16 U/L (ref 15–41)
Albumin: 3.6 g/dL (ref 3.5–5.0)
Alkaline Phosphatase: 86 U/L (ref 38–126)
Anion gap: 6 (ref 5–15)
BUN: 24 mg/dL — ABNORMAL HIGH (ref 8–23)
CO2: 23 mmol/L (ref 22–32)
Calcium: 8.7 mg/dL — ABNORMAL LOW (ref 8.9–10.3)
Chloride: 105 mmol/L (ref 98–111)
Creatinine, Ser: 0.65 mg/dL (ref 0.61–1.24)
GFR, Estimated: >60 mL/min (ref >60)
Glucose, Bld: 156 mg/dL — ABNORMAL HIGH (ref 70–99)
Potassium: 4.4 mmol/L (ref 3.5–5.1)
Sodium: 134 mmol/L — ABNORMAL LOW (ref 135–145)
Total Bilirubin: 1 mg/dL (ref 0.3–1.2)
Total Protein: 6.3 g/dL — ABNORMAL LOW (ref 6.5–8.1)

## 2022-06-21 LAB — HEMOGLOBIN AND HEMATOCRIT, BLOOD
HCT: 32.9 % — ABNORMAL LOW (ref 39.0–52.0)
Hemoglobin: 11 g/dL — ABNORMAL LOW (ref 13.0–17.0)

## 2022-06-21 LAB — RETICULOCYTES
Immature Retic Fract: 12.2 % (ref 2.3–15.9)
RBC.: 3.51 MIL/uL — ABNORMAL LOW (ref 4.22–5.81)
Retic Count, Absolute: 50.2 K/uL (ref 19.0–186.0)
Retic Ct Pct: 1.4 % (ref 0.4–3.1)

## 2022-06-21 LAB — TYPE AND SCREEN
ABO/RH(D): O NEG
Antibody Screen: NEGATIVE

## 2022-06-21 LAB — FOLATE: Folate: 10.8 ng/mL (ref >5.9)

## 2022-06-21 LAB — MAGNESIUM: Magnesium: 1.9 mg/dL (ref 1.7–2.4)

## 2022-06-21 LAB — FERRITIN: Ferritin: 33 ng/mL (ref 24–336)

## 2022-06-21 LAB — VITAMIN B12: Vitamin B-12: 492 pg/mL (ref 180–914)

## 2022-06-21 MED ORDER — PEG 3350-KCL-NA BICARB-NACL 420 G PO SOLR
4000.0000 mL | Freq: Once | ORAL | Status: AC
  Administered 2022-06-21: 4000 mL via ORAL

## 2022-06-21 MED ORDER — SODIUM CHLORIDE 0.9 % IV SOLN: INTRAVENOUS | Status: AC

## 2022-06-21 MED ORDER — INSULIN ASPART 100 UNIT/ML IJ SOLN: 0.0000 [IU] | Freq: Four times a day (QID) | INTRAMUSCULAR | Status: DC

## 2022-06-21 MED ORDER — POLYETHYLENE GLYCOL 3350 17 G PO PACK
17.0000 g | PACK | Freq: Three times a day (TID) | ORAL | Status: DC
  Administered 2022-06-21 (×2): 17 g via ORAL
  Filled 2022-06-21 (×2): qty 1

## 2022-06-21 NOTE — H&P (View-Only) (Signed)
[facility] Gastroenterology Consult  Referring Provider: ER Primary Care Physician:  Alvera Singh, [place] Primary Gastroenterologist: Althia Forts  Reason for Consultation: Rectal bleeding, abdominal CAT scan  HPI: Jason Bailey is a 72 y.o. male states that he was in his usual state of health until Sunday, 5 days ago, when he noticed bright red blood per rectum the day after eating outside in a [employer].  He noted that his stools were loose and contain bright red blood, had about 2-3 episodes a day and it continued for the next 4 days which prompted him to go for further evaluation at med center [facility] emergency department.  Patient had a colonoscopy in August 23 at [facility] for surveillance, history of colon polyps which was reported as suboptimal prep but noted to have left and right sided diverticulosis.  He was recommended to repeat colonoscopy in 1 year.  Patient states after colonoscopy, in 2 weeks he had 1 episode of bright red blood which resolved and and only recurred 4 days ago. He denies recent unintentional weight loss, has lost about 50 pounds in the last 15 years.  Denies nausea, vomiting, abdominal pain, fever, loss of appetite, acid reflux, heartburn, difficulty swallowing, pain on swallowing. There is no family history of colon cancer. He denies recent sick contacts or recent antibiotic usage. He is on aspirin 81 mg a day, denies use of other NSAIDs or Goody powders.   Past Medical History:  Diagnosis Date   Chest pain    Coronary artery disease    Diabetes mellitus    Heart attack ([place])    approx. 2009   Hyperlipidemia    Hypertension    Malaise and fatigue    Osteoarthritis    Right foot drop    Right shoulder pain    Vision abnormalities     Past Surgical History:  Procedure Laterality Date   ANTERIOR CERVICAL DECOMP/DISCECTOMY FUSION N/A 03/26/2019   Procedure: ANTERIOR CERVICAL DECOMPRESSION/DISCECTOMY FUSION ONE LEVEL, CERVICAL FIVE - CERVICAL SIX;   Surgeon: Jovita Gamma, MD;  Location: [place];  Service: Neurosurgery;  Laterality: N/A;  ANTERIOR CERVICAL DECOMPRESSION/DISCECTOMY FUSION ONE LEVEL, CERVICAL FIVE - CERVICAL SIX    CAD     disease status post CABG x 4 on 02/14/2007   KNEE SURGERY      Prior to Admission medications   Medication Sig Start Date End Date Taking? Authorizing Provider  aspirin EC 81 MG tablet Take 1 tablet (81 mg total) by mouth daily. 08/12/19   Josue Hector, MD  cholecalciferol (VITAMIN D) 1000 units tablet Take 1,000 Units by mouth daily.    [provider]  cyclobenzaprine (FLEXERIL) 5 MG tablet Take 5 mg by mouth 3 (three) times daily as needed for muscle spasms. 03/23/19   [provider]  glimepiride (AMARYL) 2 MG tablet Take 1 tablet by mouth daily. 05/11/19 02/02/22  [provider]  HYDROcodone-acetaminophen (NORCO/VICODIN) 5-325 MG tablet Take 1-2 tablets by mouth every 4 (four) hours as needed (pain). 03/27/19   Jovita Gamma, MD  lisinopril (PRINIVIL,ZESTRIL) 10 MG tablet Take 5 mg by mouth at bedtime.     [provider]  metFORMIN (GLUCOPHAGE-XR) 500 MG 24 hr tablet Take 2 tablets (1,000 mg total) by mouth 2 (two) times daily. 04/06/19   Renato Shin, MD  montelukast (SINGULAIR) 10 MG tablet Take 1 tablet by mouth at bedtime.  07/07/18   [provider]  nitroGLYCERIN (NITROSTAT) 0.4 MG SL tablet Place 1 tablet (0.4 mg total)  under the tongue every 5 (five) minutes as needed for chest pain. 01/12/13   Josue Hector, MD  pravastatin (PRAVACHOL) 40 MG tablet Take 1 tablet (40 mg total) by mouth daily. 06/14/17   Rai, Vernelle Emerald, MD  Testosterone Cypionate 200 MG/ML KIT Inject 100 mg into the muscle every 7 (seven) days.     [provider]  vitamin B-12 (CYANOCOBALAMIN) 500 MCG tablet Take 500 mcg by mouth daily.    [provider]    Current Facility-Administered Medications  Medication Dose Route Frequency Provider Last Rate Last  Admin   0.9 %  sodium chloride infusion   Intravenous Continuous Howerter, Justin B, DO 75 mL/hr at 06/21/22 0616 New Bag at 06/21/22 0616   acetaminophen (TYLENOL) tablet 650 mg  650 mg Oral Q6H PRN Howerter, Justin B, DO       Or   acetaminophen (TYLENOL) suppository 650 mg  650 mg Rectal Q6H PRN Howerter, Justin B, DO       insulin aspart (novoLOG) injection 0-6 Units  0-6 Units Subcutaneous Q6H Howerter, Justin B, DO       polyethylene glycol-electrolytes (NuLYTELY) solution 4,000 mL  4,000 mL Oral Once Ronnette Juniper, MD        Allergies as of 06/20/2022   (No Known Allergies)    Family History  Problem Relation Age of Onset   Breast cancer Mother    Liver cancer Father    Renal cancer Sister    Diabetes Maternal Grandfather     Social History   Socioeconomic History   Marital status: Married    Spouse name: Not on file   Number of children: Not on file   Years of education: Not on file   Highest education level: Not on file  Occupational History   Not on file  Tobacco Use   Smoking status: Former   Smokeless tobacco: Never   Tobacco comments:    quit 4 yrs ago  Substance and Sexual Activity   Alcohol use: No   Drug use: No   Sexual activity: Not on file  Other Topics Concern   Not on file  Social History Narrative   Not on file   Social Determinants of Health   Financial Resource Strain: Not on file  Food Insecurity: No Food Insecurity (06/20/2022)   Hunger Vital Sign    Worried About Running Out of Food in the Last Year: Never true    Ran Out of Food in the Last Year: Never true  Transportation Needs: No Transportation Needs (06/20/2022)   PRAPARE - Hydrologist (Medical): No    Lack of Transportation (Non-Medical): No  Physical Activity: Not on file  Stress: Not on file  Social Connections: Not on file  Intimate Partner Violence: Not At Risk (06/20/2022)   Humiliation, Afraid, Rape, and Kick questionnaire    Fear of Current or  Ex-Partner: No    Emotionally Abused: No    Physically Abused: No    Sexually Abused: No    Review of Systems: Positive for: GI: Described in detail in HPI.    Gen: Denies any fever, chills, rigors, night sweats, anorexia, fatigue, weakness, malaise, involuntary weight loss, and sleep disorder CV: Denies chest pain, angina, palpitations, syncope, orthopnea, PND, peripheral edema, and claudication. Resp: Denies dyspnea, cough, sputum, wheezing, coughing up blood. GU : Denies urinary burning, blood in urine, urinary frequency, urinary hesitancy, nocturnal urination, and urinary incontinence. MS: Denies joint pain or swelling.  Denies muscle weakness, cramps, atrophy.  Derm: Denies rash, itching, oral ulcerations, hives, unhealing ulcers.  Psych: Denies depression, anxiety, memory loss, suicidal ideation, hallucinations,  and confusion. Heme: Denies bruising and enlarged lymph nodes. Neuro:  Denies any headaches, dizziness, paresthesias. Endo:  DM, denies any problems with  thyroid, adrenal function.  Physical Exam: Vital signs in last 24 hours: Temp:  [97.4 F (36.3 C)-98.2 F (36.8 C)] 97.4 F (36.3 C) (01/11 1017) Pulse Rate:  [25-82] 62 (01/11 1017) Resp:  [13-20] 19 (01/11 1017) BP: (92-146)/(52-72) 121/64 (01/11 1017) SpO2:  [76 %-100 %] 97 % (01/11 1017) Weight:  [66.1 kg-72.1 kg] 66.1 kg (01/11 0500) Last BM Date : 06/20/22  General:   Alert,  Well-developed, well-nourished, pleasant and cooperative in NAD Head:  Normocephalic and atraumatic. Eyes:  Sclera clear, no icterus.   Conjunctiva pink. Ears:  Normal auditory acuity. Nose:  No deformity, discharge,  or lesions. Mouth:  No deformity or lesions.  Oropharynx pink & moist. Neck:  Supple; no masses or thyromegaly. Lungs:  Clear throughout to auscultation.   No wheezes, crackles, or rhonchi. No acute distress. Heart:  Regular rate and rhythm; no murmurs, clicks, rubs,  or gallops. Extremities:  Without clubbing or  edema. Neurologic:  Alert and  oriented x4;  grossly normal neurologically. Skin:  Intact without significant lesions or rashes. Psych:  Alert and cooperative. Normal mood and affect. Abdomen:  Soft, nontender and nondistended. No masses, hepatosplenomegaly or hernias noted. Normal bowel sounds, without guarding, and without rebound.         Lab Results: Recent Labs    06/20/22 1320 06/20/22 2306 06/21/22 0909 06/21/22 0910  WBC 9.2  --  7.0  --   HGB 12.0* 10.9* 11.2* 11.0*  HCT 35.0* 32.3* 34.4* 32.9*  PLT 270  --  224  --    BMET Recent Labs    06/20/22 1320 06/21/22 0909  NA 132* 134*  K 4.5 4.4  CL 100 105  CO2 21* 23  GLUCOSE 267* 156*  BUN 31* 24*  CREATININE 0.73 0.65  CALCIUM 8.8* 8.7*   LFT Recent Labs    06/21/22 0909  PROT 6.3*  ALBUMIN 3.6  AST 16  ALT 17  ALKPHOS 86  BILITOT 1.0   PT/INR Recent Labs    06/21/22 0909  LABPROT 14.0  INR 1.1    Studies/Results: CT ABDOMEN PELVIS W CONTRAST  Result Date: 06/20/2022 CLINICAL DATA:  Epigastric pain.  Rectal bleeding EXAM: CT ABDOMEN AND PELVIS WITH CONTRAST TECHNIQUE: Multidetector CT imaging of the abdomen and pelvis was performed using the standard protocol following bolus administration of intravenous contrast. RADIATION DOSE REDUCTION: This exam was performed according to the departmental dose-optimization program which includes automated exposure control, adjustment of the mA and/or kV according to patient size and/or use of iterative reconstruction technique. CONTRAST:  118m OMNIPAQUE IOHEXOL 300 MG/ML  SOLN COMPARISON:  None Available. FINDINGS: Lower chest: No acute abnormality. Hepatobiliary: Mildly decreased attenuation of the hepatic parenchyma. Subcentimeter hyperenhancing focus at the right hepatic dome is likely a flash filling hemangioma or vascular shunt, but is too small to definitively characterize. Otherwise, no focal liver abnormality. Multiple small stones within the gallbladder.  No wall thickening or pericholecystic inflammatory changes are seen. No biliary dilatation. Pancreas: Unremarkable. No pancreatic ductal dilatation or surrounding inflammatory changes. Spleen: Normal in size without focal abnormality. Adrenals/Urinary Tract: 8 mm right adrenal gland nodule. 8 mm left adrenal gland nodule. These are probably benign and do not  require follow-up imaging. Kidneys enhance symmetrically. No solid renal mass. 3 mm stone within the inferior pole of the right kidney. No left-sided renal calculi. No hydronephrosis. Urinary bladder is within normal limits for the degree of distension. Stomach/Bowel: Moderate-sized hiatal hernia. Stomach otherwise within normal limits. No abnormally dilated loops of bowel. There is a short segment of circumferential wall thickening of the mid ascending colon (series 2, image 38; series 5, image 56) with luminal narrowing. No associated pericolonic fat stranding or fluid. A normal appendix is seen in the right lower quadrant. No additional sites of bowel wall thickening. There is diverticulosis of the descending and sigmoid colon. No pericolonic inflammatory changes. Vascular/Lymphatic: Aortic atherosclerosis. No enlarged abdominal or pelvic lymph nodes. 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 Other: No free fluid. No abdominopelvic fluid collection. No pneumoperitoneum. Small fat containing right inguinal hernia. Musculoskeletal: No acute or significant osseous findings. 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 IMPRESSION: 1. Short segment of circumferential wall thickening of the mid ascending colon with luminal narrowing. No associated pericolonic fat stranding or fluid. Although this could potentially represent focal peristalsis, findings are most concerning for a colonic neoplasm. Further evaluation with colonoscopy is recommended. 2. Colonic diverticulosis without evidence of acute diverticulitis. 3.  Cholelithiasis without evidence of acute cholecystitis. 4. Nonobstructing 3 mm right renal stone. 5. Moderate-sized hiatal hernia. 6. Small fat containing right inguinal hernia. 7. Aortic Atherosclerosis (ICD10-I70.0). Electronically Signed   By: Davina Poke D.O.   On: 06/20/2022 16:14    Impression: Painless hematochezia Abnormal CAT scan: Short segment of circumferential wall thickening of mid ascending colon with luminal narrowing?  Most concerning for colonic neoplasm  Colonic diverticulosis without inflammation Moderate-sized hiatal hernia Small fat-containing right inguinal hernia  Hemoglobin fairly stable,12/10.9/11.2/11 Has not had a bowel movement since admission, has not required blood transfusion Elevated BUN/creatinine ratio, BUN 31 on admission, has decreased to 24  Plan: Diagnostic colonoscopy tomorrow in a.m.. Will start clear liquid diet, start split colonic prep and keep n.p.o. postmidnight except remaining colonic prep. The risks and the benefits of the procedure were discussed in details with the patient and his son present at bedside. They understand and verbalized consent.   LOS: 0 days   Ronnette Juniper, MD  06/21/2022, 10:25 AM

## 2022-06-21 NOTE — Consult Note (Signed)
[facility]enterology Consult  Referring Provider: ER Primary Care Physician:  Alvera Singh, [place] Primary Gastroenterologist: Althia Forts  Reason for Consultation: Rectal bleeding, abdominal CAT scan  HPI: Jason Bailey is a 72 y.o. male states that he was in his usual state of health until Sunday, 5 days ago, when he noticed bright red blood per rectum the day after eating outside in a [employer].  He noted that his stools were loose and contain bright red blood, had about 2-3 episodes a day and it continued for the next 4 days which prompted him to go for further evaluation at med center [facility] emergency department.  Patient had a colonoscopy in August 23 at [facility] for surveillance, history of colon polyps which was reported as suboptimal prep but noted to have left and right sided diverticulosis.  He was recommended to repeat colonoscopy in 1 year.  Patient states after colonoscopy, in 2 weeks he had 1 episode of bright red blood which resolved and and only recurred 4 days ago. He denies recent unintentional weight loss, has lost about 50 pounds in the last 15 years.  Denies nausea, vomiting, abdominal pain, fever, loss of appetite, acid reflux, heartburn, difficulty swallowing, pain on swallowing. There is no family history of colon cancer. He denies recent sick contacts or recent antibiotic usage. He is on aspirin 81 mg a day, denies use of other NSAIDs or Goody powders.   Past Medical History:  Diagnosis Date   Chest pain    Coronary artery disease    Diabetes mellitus    Heart attack ([place])    approx. 2009   Hyperlipidemia    Hypertension    Malaise and fatigue    Osteoarthritis    Right foot drop    Right shoulder pain    Vision abnormalities     Past Surgical History:  Procedure Laterality Date   ANTERIOR CERVICAL DECOMP/DISCECTOMY FUSION N/A 03/26/2019   Procedure: ANTERIOR CERVICAL DECOMPRESSION/DISCECTOMY FUSION ONE LEVEL, CERVICAL FIVE - CERVICAL SIX;   Surgeon: Jovita Gamma, MD;  Location: [place];  Service: Neurosurgery;  Laterality: N/A;  ANTERIOR CERVICAL DECOMPRESSION/DISCECTOMY FUSION ONE LEVEL, CERVICAL FIVE - CERVICAL SIX    CAD     disease status post CABG x 4 on 02/14/2007   KNEE SURGERY      Prior to Admission medications   Medication Sig Start Date End Date Taking? Authorizing Provider  aspirin EC 81 MG tablet Take 1 tablet (81 mg total) by mouth daily. 08/12/19   Josue Hector, MD  cholecalciferol (VITAMIN D) 1000 units tablet Take 1,000 Units by mouth daily.    [provider]  cyclobenzaprine (FLEXERIL) 5 MG tablet Take 5 mg by mouth 3 (three) times daily as needed for muscle spasms. 03/23/19   [provider]  glimepiride (AMARYL) 2 MG tablet Take 1 tablet by mouth daily. 05/11/19 02/02/22  [provider]  HYDROcodone-acetaminophen (NORCO/VICODIN) 5-325 MG tablet Take 1-2 tablets by mouth every 4 (four) hours as needed (pain). 03/27/19   Jovita Gamma, MD  lisinopril (PRINIVIL,ZESTRIL) 10 MG tablet Take 5 mg by mouth at bedtime.     [provider]  metFORMIN (GLUCOPHAGE-XR) 500 MG 24 hr tablet Take 2 tablets (1,000 mg total) by mouth 2 (two) times daily. 04/06/19   Renato Shin, MD  montelukast (SINGULAIR) 10 MG tablet Take 1 tablet by mouth at bedtime.  07/07/18   [provider]  nitroGLYCERIN (NITROSTAT) 0.4 MG SL tablet Place 1 tablet (0.4 mg total)  under the tongue every 5 (five) minutes as needed for chest pain. 01/12/13   Josue Hector, MD  pravastatin (PRAVACHOL) 40 MG tablet Take 1 tablet (40 mg total) by mouth daily. 06/14/17   Rai, Vernelle Emerald, MD  Testosterone Cypionate 200 MG/ML KIT Inject 100 mg into the muscle every 7 (seven) days.     [provider]  vitamin B-12 (CYANOCOBALAMIN) 500 MCG tablet Take 500 mcg by mouth daily.    [provider]    Current Facility-Administered Medications  Medication Dose Route Frequency Provider Last Rate Last  Admin   0.9 %  sodium chloride infusion   Intravenous Continuous Howerter, Justin B, DO 75 mL/hr at 06/21/22 0616 New Bag at 06/21/22 0616   acetaminophen (TYLENOL) tablet 650 mg  650 mg Oral Q6H PRN Howerter, Justin B, DO       Or   acetaminophen (TYLENOL) suppository 650 mg  650 mg Rectal Q6H PRN Howerter, Justin B, DO       insulin aspart (novoLOG) injection 0-6 Units  0-6 Units Subcutaneous Q6H Howerter, Justin B, DO       polyethylene glycol-electrolytes (NuLYTELY) solution 4,000 mL  4,000 mL Oral Once Ronnette Juniper, MD        Allergies as of 06/20/2022   (No Known Allergies)    Family History  Problem Relation Age of Onset   Breast cancer Mother    Liver cancer Father    Renal cancer Sister    Diabetes Maternal Grandfather     Social History   Socioeconomic History   Marital status: Married    Spouse name: Not on file   Number of children: Not on file   Years of education: Not on file   Highest education level: Not on file  Occupational History   Not on file  Tobacco Use   Smoking status: Former   Smokeless tobacco: Never   Tobacco comments:    quit 4 yrs ago  Substance and Sexual Activity   Alcohol use: No   Drug use: No   Sexual activity: Not on file  Other Topics Concern   Not on file  Social History Narrative   Not on file   Social Determinants of Health   Financial Resource Strain: Not on file  Food Insecurity: No Food Insecurity (06/20/2022)   Hunger Vital Sign    Worried About Running Out of Food in the Last Year: Never true    Ran Out of Food in the Last Year: Never true  Transportation Needs: No Transportation Needs (06/20/2022)   PRAPARE - Hydrologist (Medical): No    Lack of Transportation (Non-Medical): No  Physical Activity: Not on file  Stress: Not on file  Social Connections: Not on file  Intimate Partner Violence: Not At Risk (06/20/2022)   Humiliation, Afraid, Rape, and Kick questionnaire    Fear of Current or  Ex-Partner: No    Emotionally Abused: No    Physically Abused: No    Sexually Abused: No    Review of Systems: Positive for: GI: Described in detail in HPI.    Gen: Denies any fever, chills, rigors, night sweats, anorexia, fatigue, weakness, malaise, involuntary weight loss, and sleep disorder CV: Denies chest pain, angina, palpitations, syncope, orthopnea, PND, peripheral edema, and claudication. Resp: Denies dyspnea, cough, sputum, wheezing, coughing up blood. GU : Denies urinary burning, blood in urine, urinary frequency, urinary hesitancy, nocturnal urination, and urinary incontinence. MS: Denies joint pain or swelling.  Denies muscle weakness, cramps, atrophy.  Derm: Denies rash, itching, oral ulcerations, hives, unhealing ulcers.  Psych: Denies depression, anxiety, memory loss, suicidal ideation, hallucinations,  and confusion. Heme: Denies bruising and enlarged lymph nodes. Neuro:  Denies any headaches, dizziness, paresthesias. Endo:  DM, denies any problems with  thyroid, adrenal function.  Physical Exam: Vital signs in last 24 hours: Temp:  [97.4 F (36.3 C)-98.2 F (36.8 C)] 97.4 F (36.3 C) (01/11 1017) Pulse Rate:  [25-82] 62 (01/11 1017) Resp:  [13-20] 19 (01/11 1017) BP: (92-146)/(52-72) 121/64 (01/11 1017) SpO2:  [76 %-100 %] 97 % (01/11 1017) Weight:  [66.1 kg-72.1 kg] 66.1 kg (01/11 0500) Last BM Date : 06/20/22  General:   Alert,  Well-developed, well-nourished, pleasant and cooperative in NAD Head:  Normocephalic and atraumatic. Eyes:  Sclera clear, no icterus.   Conjunctiva pink. Ears:  Normal auditory acuity. Nose:  No deformity, discharge,  or lesions. Mouth:  No deformity or lesions.  Oropharynx pink & moist. Neck:  Supple; no masses or thyromegaly. Lungs:  Clear throughout to auscultation.   No wheezes, crackles, or rhonchi. No acute distress. Heart:  Regular rate and rhythm; no murmurs, clicks, rubs,  or gallops. Extremities:  Without clubbing or  edema. Neurologic:  Alert and  oriented x4;  grossly normal neurologically. Skin:  Intact without significant lesions or rashes. Psych:  Alert and cooperative. Normal mood and affect. Abdomen:  Soft, nontender and nondistended. No masses, hepatosplenomegaly or hernias noted. Normal bowel sounds, without guarding, and without rebound.         Lab Results: Recent Labs    06/20/22 1320 06/20/22 2306 06/21/22 0909 06/21/22 0910  WBC 9.2  --  7.0  --   HGB 12.0* 10.9* 11.2* 11.0*  HCT 35.0* 32.3* 34.4* 32.9*  PLT 270  --  224  --    BMET Recent Labs    06/20/22 1320 06/21/22 0909  NA 132* 134*  K 4.5 4.4  CL 100 105  CO2 21* 23  GLUCOSE 267* 156*  BUN 31* 24*  CREATININE 0.73 0.65  CALCIUM 8.8* 8.7*   LFT Recent Labs    06/21/22 0909  PROT 6.3*  ALBUMIN 3.6  AST 16  ALT 17  ALKPHOS 86  BILITOT 1.0   PT/INR Recent Labs    06/21/22 0909  LABPROT 14.0  INR 1.1    Studies/Results: CT ABDOMEN PELVIS W CONTRAST  Result Date: 06/20/2022 CLINICAL DATA:  Epigastric pain.  Rectal bleeding EXAM: CT ABDOMEN AND PELVIS WITH CONTRAST TECHNIQUE: Multidetector CT imaging of the abdomen and pelvis was performed using the standard protocol following bolus administration of intravenous contrast. RADIATION DOSE REDUCTION: This exam was performed according to the departmental dose-optimization program which includes automated exposure control, adjustment of the mA and/or kV according to patient size and/or use of iterative reconstruction technique. CONTRAST:  18m OMNIPAQUE IOHEXOL 300 MG/ML  SOLN COMPARISON:  None Available. FINDINGS: Lower chest: No acute abnormality. Hepatobiliary: Mildly decreased attenuation of the hepatic parenchyma. Subcentimeter hyperenhancing focus at the right hepatic dome is likely a flash filling hemangioma or vascular shunt, but is too small to definitively characterize. Otherwise, no focal liver abnormality. Multiple small stones within the gallbladder.  No wall thickening or pericholecystic inflammatory changes are seen. No biliary dilatation. Pancreas: Unremarkable. No pancreatic ductal dilatation or surrounding inflammatory changes. Spleen: Normal in size without focal abnormality. Adrenals/Urinary Tract: 8 mm right adrenal gland nodule. 8 mm left adrenal gland nodule. These are probably benign and do not  require follow-up imaging. Kidneys enhance symmetrically. No solid renal mass. 3 mm stone within the inferior pole of the right kidney. No left-sided renal calculi. No hydronephrosis. Urinary bladder is within normal limits for the degree of distension. Stomach/Bowel: Moderate-sized hiatal hernia. Stomach otherwise within normal limits. No abnormally dilated loops of bowel. There is a short segment of circumferential wall thickening of the mid ascending colon (series 2, image 38; series 5, image 56) with luminal narrowing. No associated pericolonic fat stranding or fluid. A normal appendix is seen in the right lower quadrant. No additional sites of bowel wall thickening. There is diverticulosis of the descending and sigmoid colon. No pericolonic inflammatory changes. Vascular/Lymphatic: Aortic atherosclerosis. No enlarged abdominal or pelvic lymph nodes. 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 Other: No free fluid. No abdominopelvic fluid collection. No pneumoperitoneum. Small fat containing right inguinal hernia. Musculoskeletal: No acute or significant osseous findings. 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 IMPRESSION: 1. Short segment of circumferential wall thickening of the mid ascending colon with luminal narrowing. No associated pericolonic fat stranding or fluid. Although this could potentially represent focal peristalsis, findings are most concerning for a colonic neoplasm. Further evaluation with colonoscopy is recommended. 2. Colonic diverticulosis without evidence of acute diverticulitis. 3.  Cholelithiasis without evidence of acute cholecystitis. 4. Nonobstructing 3 mm right renal stone. 5. Moderate-sized hiatal hernia. 6. Small fat containing right inguinal hernia. 7. Aortic Atherosclerosis (ICD10-I70.0). Electronically Signed   By: Davina Poke D.O.   On: 06/20/2022 16:14    Impression: Painless hematochezia Abnormal CAT scan: Short segment of circumferential wall thickening of mid ascending colon with luminal narrowing?  Most concerning for colonic neoplasm  Colonic diverticulosis without inflammation Moderate-sized hiatal hernia Small fat-containing right inguinal hernia  Hemoglobin fairly stable,12/10.9/11.2/11 Has not had a bowel movement since admission, has not required blood transfusion Elevated BUN/creatinine ratio, BUN 31 on admission, has decreased to 24  Plan: Diagnostic colonoscopy tomorrow in a.m.. Will start clear liquid diet, start split colonic prep and keep n.p.o. postmidnight except remaining colonic prep. The risks and the benefits of the procedure were discussed in details with the patient and his son present at bedside. They understand and verbalized consent.   LOS: 0 days   Ronnette Juniper, MD  06/21/2022, 10:25 AM

## 2022-06-21 NOTE — H&P (Signed)
PROGRESS NOTE    Jason Bailey  CXK:[id number]  DOB: [birth year]  DOA: 06/20/2022 PCP: Alvera Singh, FNP Outpatient Specialists:   Hospital course:  72 year old man was admitted yesterday with BRBPR x 4 days.    Subjective:  Patient states he feels okay.  Has not had any further hematochezia although notes he has not had a bowel movement since yesterday.  Notes he has not had anything to eat for 36 hours now.  States he was seen by GI earlier today and they are planning for colonoscopy.  He is not looking forward to drinking all the GoLytely.   Objective: Vitals:   06/21/22 0500 06/21/22 0601 06/21/22 1017 06/21/22 1348  BP:  (!) 138/54 121/64 135/65  Pulse:  65 62 67  Resp:  '16 19 16  '$ Temp:  97.7 F (36.5 C) (!) 97.4 F (36.3 C) 98.3 F (36.8 C)  TempSrc:  Oral Oral Oral  SpO2:  96% 97% 100%  Weight: 66.1 kg     Height:        Intake/Output Summary (Last 24 hours) at 06/21/2022 1534 Last data filed at 06/21/2022 1030 Gross per 24 hour  Intake 740.29 ml  Output --  Net 740.29 ml   Filed Weights   06/20/22 1312 06/21/22 0500  Weight: 72.1 kg 66.1 kg     Exam:  General: Relatively well-appearing man sitting up in bed drinking GoLytely. Eyes: sclera anicteric, conjuctiva mild injection bilaterally CVS: S1-S2, regular  Respiratory:  decreased air entry bilaterally secondary to decreased inspiratory effort, rales at bases  GI: NABS, soft, NT  LE: Warm and well-perfused Neuro: A/O x 3,  grossly nonfocal.  Psych: patient is logical and coherent, judgement and insight appear normal, mood and affect appropriate to situation.  Data Reviewed:  Basic Metabolic Panel: Recent Labs  Lab 06/20/22 1320 06/21/22 0909  NA 132* 134*  K 4.5 4.4  CL 100 105  CO2 21* 23  GLUCOSE 267* 156*  BUN 31* 24*  CREATININE 0.73 0.65  CALCIUM 8.8* 8.7*  MG  --  1.9    CBC: Recent Labs  Lab 06/20/22 1320 06/20/22 2306 06/21/22 0909 06/21/22 0910  WBC 9.2  --  7.0   --   NEUTROABS 6.2  --  4.9  --   HGB 12.0* 10.9* 11.2* 11.0*  HCT 35.0* 32.3* 34.4* 32.9*  MCV 91.9  --  97.5  --   PLT 270  --  224  --      Scheduled Meds:  insulin aspart  0-6 Units Subcutaneous Q6H   Continuous Infusions:  sodium chloride 75 mL/hr at 06/21/22 0616     Assessment & Plan:   BRBPR Patient has remained hemodynamically stable Seen by GI today who note short segment of circumferential wall from the opening of the mid ascending colon with possible luminal narrowing, possibly concerning for colonic neoplasm. Also noted to have diverticulosis without inflammation Plan is for diagnostic colonoscopy tomorrow  DM 2 Blood sugars under reasonable control on present regimen SSI every 6 hours as patient is only on clear liquid diet. Glimepiride, metformin and Jardiance are on hold  HTN Hold antihypertensives/lisinopril given GI bleed    DVT prophylaxis: SCD Code Status: Full Family Communication: None, patient states he is in contact with his family    Studies: CT ABDOMEN PELVIS W CONTRAST  Result Date: 06/20/2022 CLINICAL DATA:  Epigastric pain.  Rectal bleeding EXAM: CT ABDOMEN AND PELVIS WITH CONTRAST TECHNIQUE: Multidetector CT imaging of the abdomen  and pelvis was performed using the standard protocol following bolus administration of intravenous contrast. RADIATION DOSE REDUCTION: This exam was performed according to the departmental dose-optimization program which includes automated exposure control, adjustment of the mA and/or kV according to patient size and/or use of iterative reconstruction technique. CONTRAST:  173m OMNIPAQUE IOHEXOL 300 MG/ML  SOLN COMPARISON:  None Available. FINDINGS: Lower chest: No acute abnormality. Hepatobiliary: Mildly decreased attenuation of the hepatic parenchyma. Subcentimeter hyperenhancing focus at the right hepatic dome is likely a flash filling hemangioma or vascular shunt, but is too small to definitively characterize.  Otherwise, no focal liver abnormality. Multiple small stones within the gallbladder. No wall thickening or pericholecystic inflammatory changes are seen. No biliary dilatation. Pancreas: Unremarkable. No pancreatic ductal dilatation or surrounding inflammatory changes. Spleen: Normal in size without focal abnormality. Adrenals/Urinary Tract: 8 mm right adrenal gland nodule. 8 mm left adrenal gland nodule. These are probably benign and do not require follow-up imaging. Kidneys enhance symmetrically. No solid renal mass. 3 mm stone within the inferior pole of the right kidney. No left-sided renal calculi. No hydronephrosis. Urinary bladder is within normal limits for the degree of distension. Stomach/Bowel: Moderate-sized hiatal hernia. Stomach otherwise within normal limits. No abnormally dilated loops of bowel. There is a short segment of circumferential wall thickening of the mid ascending colon (series 2, image 38; series 5, image 56) with luminal narrowing. No associated pericolonic fat stranding or fluid. A normal appendix is seen in the right lower quadrant. No additional sites of bowel wall thickening. There is diverticulosis of the descending and sigmoid colon. No pericolonic inflammatory changes. Vascular/Lymphatic: Aortic atherosclerosis. No enlarged abdominal or pelvic lymph nodes. 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 Other: No free fluid. No abdominopelvic fluid collection. No pneumoperitoneum. Small fat containing right inguinal hernia. Musculoskeletal: No acute or significant osseous findings. 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 IMPRESSION: 1. Short segment of circumferential wall thickening of the mid ascending colon with luminal narrowing. No associated pericolonic fat stranding or fluid. Although this could potentially represent focal peristalsis, findings are most concerning for a colonic neoplasm. Further evaluation with colonoscopy is  recommended. 2. Colonic diverticulosis without evidence of acute diverticulitis. 3. Cholelithiasis without evidence of acute cholecystitis. 4. Nonobstructing 3 mm right renal stone. 5. Moderate-sized hiatal hernia. 6. Small fat containing right inguinal hernia. 7. Aortic Atherosclerosis (ICD10-I70.0). Electronically Signed   By: NDavina PokeD.O.   On: 06/20/2022 16:14    Principal Problem:   Lower GI bleed Active Problems:   DM2 (diabetes mellitus, type 2) ([place]   HLD (hyperlipidemia)   Essential hypertension   Acute blood loss anemia   Dehydration   Acute prerenal azotemia     SVashti Hey [facility]  If 7PM-7AM, please contact night-coverage [email or web address]   LOS: 0 days

## 2022-06-22 ENCOUNTER — Encounter (HOSPITAL_COMMUNITY)
Admission: EM | Disposition: A | Discharge status: Home / Self Care | Payer: Self-pay | Source: Home / Self Care | Attending: Emergency Medicine

## 2022-06-22 ENCOUNTER — Observation Stay (HOSPITAL_COMMUNITY): Payer: Medicare Other | Admitting: Certified Registered Nurse Anesthetist

## 2022-06-22 ENCOUNTER — Observation Stay (HOSPITAL_BASED_OUTPATIENT_CLINIC_OR_DEPARTMENT_OTHER): Payer: Medicare Other | Admitting: Certified Registered Nurse Anesthetist

## 2022-06-22 ENCOUNTER — Encounter (HOSPITAL_COMMUNITY): Payer: Self-pay | Admitting: Internal Medicine

## 2022-06-22 DIAGNOSIS — I2510 Atherosclerotic heart disease of native coronary artery without angina pectoris: Secondary | ICD-10-CM | POA: Diagnosis not present

## 2022-06-22 DIAGNOSIS — I252 Old myocardial infarction: Secondary | ICD-10-CM

## 2022-06-22 DIAGNOSIS — K922 Gastrointestinal hemorrhage, unspecified: Secondary | ICD-10-CM | POA: Diagnosis not present

## 2022-06-22 DIAGNOSIS — K648 Other hemorrhoids: Secondary | ICD-10-CM

## 2022-06-22 DIAGNOSIS — D125 Benign neoplasm of sigmoid colon: Secondary | ICD-10-CM

## 2022-06-22 DIAGNOSIS — K5731 Diverticulosis of large intestine without perforation or abscess with bleeding: Secondary | ICD-10-CM

## 2022-06-22 DIAGNOSIS — I10 Essential (primary) hypertension: Secondary | ICD-10-CM

## 2022-06-22 DIAGNOSIS — Z87891 Personal history of nicotine dependence: Secondary | ICD-10-CM

## 2022-06-22 HISTORY — PX: POLYPECTOMY: SHX5525

## 2022-06-22 HISTORY — PX: COLONOSCOPY WITH PROPOFOL: SHX5780

## 2022-06-22 LAB — GLUCOSE, CAPILLARY
Glucose-Capillary: 135 mg/dL — ABNORMAL HIGH (ref 70–99)
Glucose-Capillary: 120 mg/dL — ABNORMAL HIGH (ref 70–99)

## 2022-06-22 LAB — CBC
HCT: 31.3 % — ABNORMAL LOW (ref 39.0–52.0)
Hemoglobin: 10.4 g/dL — ABNORMAL LOW (ref 13.0–17.0)
MCH: 31.1 pg (ref 26.0–34.0)
MCHC: 33.2 g/dL (ref 30.0–36.0)
MCV: 93.7 fL (ref 80.0–100.0)
Platelets: 215 10*3/uL (ref 150–400)
RBC: 3.34 MIL/uL — ABNORMAL LOW (ref 4.22–5.81)
RDW: 12.9 % (ref 11.5–15.5)
WBC: 6 10*3/uL (ref 4.0–10.5)
nRBC: 0 % (ref 0.0–0.2)

## 2022-06-22 SURGERY — COLONOSCOPY WITH PROPOFOL
Anesthesia: Monitor Anesthesia Care

## 2022-06-22 MED ORDER — POLYETHYLENE GLYCOL 3350 17 G PO PACK: 17.0000 g | PACK | Freq: Every day | ORAL | 0 refills | Status: DC

## 2022-06-22 MED ORDER — PHENYLEPHRINE 80 MCG/ML (10ML) SYRINGE FOR IV PUSH (FOR BLOOD PRESSURE SUPPORT)
PREFILLED_SYRINGE | INTRAVENOUS | Status: DC | PRN
  Administered 2022-06-22: 160 ug via INTRAVENOUS
  Administered 2022-06-22 (×3): 80 ug via INTRAVENOUS
  Administered 2022-06-22: 160 ug via INTRAVENOUS

## 2022-06-22 MED ORDER — LACTATED RINGERS IV SOLN
INTRAVENOUS | Status: DC
  Administered 2022-06-22: 1000 mL via INTRAVENOUS

## 2022-06-22 MED ORDER — PROPOFOL 10 MG/ML IV BOLUS
INTRAVENOUS | Status: DC | PRN
  Administered 2022-06-22: 30 mg via INTRAVENOUS
  Administered 2022-06-22: 20 mg via INTRAVENOUS

## 2022-06-22 MED ORDER — PROPOFOL 10 MG/ML IV BOLUS
INTRAVENOUS | Status: AC
  Filled 2022-06-22: qty 20

## 2022-06-22 MED ORDER — PROPOFOL 1000 MG/100ML IV EMUL
INTRAVENOUS | Status: AC
  Filled 2022-06-22: qty 100

## 2022-06-22 MED ORDER — PROPOFOL 500 MG/50ML IV EMUL
INTRAVENOUS | Status: DC | PRN
  Administered 2022-06-22: 125 ug/kg/min via INTRAVENOUS

## 2022-06-22 MED ORDER — SODIUM CHLORIDE 0.9 % IV SOLN: INTRAVENOUS | Status: DC

## 2022-06-22 MED ORDER — LIDOCAINE 2% (20 MG/ML) 5 ML SYRINGE
INTRAMUSCULAR | Status: DC | PRN
  Administered 2022-06-22: 60 mg via INTRAVENOUS

## 2022-06-22 SURGICAL SUPPLY — 22 items

## 2022-06-22 NOTE — TOC Progression Note (Signed)
Transition of Care [facility]) - Progression Note    Patient Details  Name: Jason Bailey MRN: [id number] Date of Birth: January 21, 1951  Transition of Care [facility]) CM/SW Contact  Servando Snare, [place] Phone Number: 06/22/2022, 8:24 AM  Clinical Narrative:      Transition of Care [facility]) Screening Note   Patient Details  Name: Jason Bailey Date of Birth: March 08, 1951   Transition of Care [facility]) CM/SW Contact:    Servando Snare, LCSW Phone Number: 06/22/2022, 8:25 AM    Transition of Care Department [facility]) has reviewed patient and no TOC needs have been identified at this time. We will continue to monitor patient advancement through interdisciplinary progression rounds. If new patient transition needs arise, please place a TOC consult.         Expected Discharge Plan and Services                                               Social Determinants of Health (SDOH) Interventions SDOH Screenings   Food Insecurity: No Food Insecurity (06/20/2022)  Housing: Low Risk  (06/20/2022)  Transportation Needs: No Transportation Needs (06/20/2022)  Utilities: Not At Risk (06/20/2022)  Tobacco Use: Medium Risk (06/21/2022)    Readmission Risk Interventions     No data to display

## 2022-06-22 NOTE — Discharge Summary (Signed)
Physician Discharge Summary  Patient ID: Jason Bailey MRN: [id number] DOB/AGE: 1950-12-05 72 y.o.  Admit date: 06/20/2022 Discharge date: 06/22/2022  Admission Diagnoses:  Discharge Diagnoses:  Principal Problem:   Lower GI bleed Active Problems:   DM2 (diabetes mellitus, type 2) (HCC)   HLD (hyperlipidemia)   Essential hypertension   Acute blood loss anemia   Dehydration   Acute prerenal azotemia Hemorrhoids. Sigmoid polyp, sessile, 3 mm. Pan colonic diverticula.   Discharged Condition: stable  Hospital Course: Patient is a 72 year old Caucasian male with past medical history significant for type 2 diabetes mellitus, hypertension and hyperlipidemia.  Patient was admitted with rectal bleed.  Patient was admitted for further assessment and management.  GI team was consulted to assist with patient's management.  Patient underwent colonoscopy.  Colonoscopy revealed hemorrhoids, sigmoid polyp (sessile and 3 mm) and pan colonic diverticula.  Patient will need repeat colonoscopy in 5 years.  Patient be discharged on high-fiber diet.  Continue to avoid constipation.  GI team is cleared patient for discharge.  Patient will discharge to the care of the primary care provider and the GI team.  During the hospital stay, blood pressure and blood sugar were optimized.  Consults: GI  Significant Diagnostic Studies:  Colonoscopy revealed: -Hemorrhoids -Sigmoid polyps, sessile, 3 mm. -Pancolonic diverticula.  Treatments: Supportive care mainly.  Discharge Exam: Blood pressure 136/71, pulse (!) 55, temperature (!) 97.5 F (36.4 C), temperature source Oral, resp. rate 19, height '5\' 7"'$  (1.702 m), weight 66.3 kg, SpO2 100 %.   Disposition: Discharge disposition: 01-Home or Self Care       Discharge Instructions     Diet - low sodium heart healthy   Complete by: As directed    High fiber diet   Increase activity slowly   Complete by: As directed       Allergies as of 06/22/2022    No Known Allergies      Medication List     STOP taking these medications    ibuprofen 200 MG tablet Commonly known as: ADVIL   Testosterone Cypionate 200 MG/ML Kit       TAKE these medications    aspirin EC 81 MG tablet Take 1 tablet (81 mg total) by mouth daily. What changed: when to take this   cholecalciferol 1000 units tablet Commonly known as: VITAMIN D Take 1,000 Units by mouth daily.   empagliflozin 25 MG Tabs tablet Commonly known as: JARDIANCE Take 25 mg by mouth daily.   fluticasone 50 MCG/ACT nasal spray Commonly known as: FLONASE Place 2 sprays into both nostrils as needed for allergies.   glimepiride 2 MG tablet Commonly known as: AMARYL Take 2 mg by mouth daily.   lisinopril 10 MG tablet Commonly known as: ZESTRIL Take 10 mg by mouth at bedtime.   metFORMIN 500 MG 24 hr tablet Commonly known as: GLUCOPHAGE-XR Take 2 tablets (1,000 mg total) by mouth 2 (two) times daily.   montelukast 10 MG tablet Commonly known as: SINGULAIR Take 10 mg by mouth daily.   nitroGLYCERIN 0.4 MG SL tablet Commonly known as: NITROSTAT Place 1 tablet (0.4 mg total) under the tongue every 5 (five) minutes as needed for chest pain.   polyethylene glycol 17 g packet Commonly known as: MIRALAX / GLYCOLAX Take 17 g by mouth daily.   pravastatin 40 MG tablet Commonly known as: PRAVACHOL Take 1 tablet (40 mg total) by mouth daily. What changed: when to take this   vitamin B-12 500 MCG tablet Commonly  known as: CYANOCOBALAMIN Take 500 mcg by mouth daily.        Time spent: 35 minutes.   SignedBonnell Public 06/22/2022, 1:26 PM

## 2022-06-22 NOTE — Anesthesia Postprocedure Evaluation (Signed)
Anesthesia Post Note  Patient: Jason Bailey  Procedure(s) Performed: COLONOSCOPY WITH PROPOFOL POLYPECTOMY     Patient location during evaluation: Endoscopy Anesthesia Type: MAC Level of consciousness: oriented, awake and alert and awake Pain management: pain level controlled Vital Signs Assessment: post-procedure vital signs reviewed and stable Respiratory status: spontaneous breathing, nonlabored ventilation, respiratory function stable and patient connected to nasal cannula oxygen Cardiovascular status: blood pressure returned to baseline and stable Postop Assessment: no headache, no backache and no apparent nausea or vomiting Anesthetic complications: no   No notable events documented.  Last Vitals:  Vitals:   06/22/22 1039 06/22/22 1121  BP:  136/71  Pulse: 74 (!) 55  Resp: 20 19  Temp:  (!) 36.4 C  SpO2: 100% 100%    Last Pain:  Vitals:   06/22/22 1121  TempSrc: Oral  PainSc:                  Santa Lighter

## 2022-06-22 NOTE — Interval H&P Note (Signed)
History and Physical Interval Note: 71/male with abnormal CT suspicious for ascending neoplasm and rectal bleeding for a colonoscopy with propofol.  06/22/2022 9:33 AM  Jason Bailey  has presented today for colonoscopy, with the diagnosis of rectal bleeding, abnormal CT ?ascending mass/inflammation.  The various methods of treatment have been discussed with the patient and family. After consideration of risks, benefits and other options for treatment, the patient has consented to  Procedure(s): COLONOSCOPY WITH PROPOFOL (N/A) as a surgical intervention.  The patient's history has been reviewed, patient examined, no change in status, stable for surgery.  I have reviewed the patient's chart and labs.  Questions were answered to the patient's satisfaction.     Ronnette Juniper

## 2022-06-22 NOTE — Op Note (Signed)
[facility] Patient Name: Jason Bailey Procedure Date: 06/22/2022 MRN: [id number] Attending MD: Ronnette Juniper , MD, [id number] Date of Birth: June 30, 1950 CSN: [id number] Age: 72 Admit Type: Inpatient Procedure:                Colonoscopy Indications:              Last colonoscopy: August 2023, Rectal bleeding,                            Abnormal CT of the GI tract (ascending colon                            thickening?mass/neoplasm vs peristalsis) Providers:                Ronnette Juniper, MD, Jaci Carrel, RN, Brien Mates, Technician Referring MD:             [facility] Medicines:                Monitored Anesthesia Care Complications:            No immediate complications. Estimated blood loss:                            Minimal. Estimated Blood Loss:     Estimated blood loss was minimal. Procedure:                Pre-Anesthesia Assessment:                           - Prior to the procedure, a History and Physical                            was performed, and patient medications and                            allergies were reviewed. The patient's tolerance of                            previous anesthesia was also reviewed. The risks                            and benefits of the procedure and the sedation                            options and risks were discussed with the patient.                            All questions were answered, and informed consent                            was obtained. Prior Anticoagulants: The patient has                            taken no anticoagulant  or antiplatelet agents                            except for aspirin. ASA Grade Assessment: III - A                            patient with severe systemic disease. After                            reviewing the risks and benefits, the patient was                            deemed in satisfactory condition to undergo the                             procedure.                           After obtaining informed consent, the colonoscope                            was passed under direct vision. Throughout the                            procedure, the patient's blood pressure, pulse, and                            oxygen saturations were monitored continuously. The                            PCF-HQ190L ([id number]) Olympus colonoscope was                            introduced through the anus and advanced to the the                            terminal ileum. The colonoscopy was performed                            without difficulty. The patient tolerated the                            procedure well. The quality of the bowel                            preparation was good. The terminal ileum, ileocecal                            valve, appendiceal orifice, and rectum were                            photographed. Scope In: 9:51:29 AM Scope Out: 10:13:01 AM Scope Withdrawal Time: 0 hours 15 minutes 9 seconds  Total Procedure Duration: 0 hours 21 minutes 32 seconds  Findings:  Hemorrhoids were found on perianal exam.      The terminal ileum appeared normal.      A 3 mm polyp was found in the sigmoid colon. The polyp was sessile. The       polyp was removed with a cold biopsy forceps. Resection and retrieval       were complete.      Multiple large-mouthed, medium-mouthed and small-mouthed diverticula       were found in the sigmoid colon, descending colon, transverse colon,       hepatic flexure and ascending colon.      Non-bleeding internal hemorrhoids were found during retroflexion.      There was no evidence of mass noted in ascending colon. Impression:               - Hemorrhoids found on perianal exam.                           - The examined portion of the ileum was normal.                           - One 3 mm polyp in the sigmoid colon, removed with                            a cold biopsy forceps. Resected and retrieved.                            - Diverticulosis in the sigmoid colon, in the                            descending colon, in the transverse colon, at the                            hepatic flexure and in the ascending colon.                           - Non-bleeding internal hemorrhoids. Moderate Sedation:      Patient did not receive moderate sedation for this procedure, but       instead received monitored anesthesia care. Recommendation:           - High fiber diet.                           - Continue present medications.                           - Await pathology results.                           - Repeat colonoscopy in 5 years for surveillance. Procedure Code(s):        --- Professional ---                           [phone], Colonoscopy, flexible; with biopsy, single                            or multiple Diagnosis Code(s):        ---  Professional ---                           K64.8, Other hemorrhoids                           D12.5, Benign neoplasm of sigmoid colon                           K62.5, Hemorrhage of anus and rectum                           K57.30, Diverticulosis of large intestine without                            perforation or abscess without bleeding                           R93.3, Abnormal findings on diagnostic imaging of                            other parts of digestive tract CPT copyright 2022 American Medical Association. All rights reserved. The codes documented in this report are preliminary and upon coder review may  be revised to meet current compliance requirements. Ronnette Juniper, MD 06/22/2022 10:23:40 AM This report has been signed electronically. Number of Addenda: 0

## 2022-06-22 NOTE — Transfer of Care (Signed)
Immediate Anesthesia Transfer of Care Note  Patient: Jason Bailey  Procedure(s) Performed: COLONOSCOPY WITH PROPOFOL POLYPECTOMY  Patient Location: Endoscopy Unit  Anesthesia Type:MAC  Level of Consciousness: drowsy and patient cooperative  Airway & Oxygen Therapy: Patient Spontanous Breathing and Patient connected to face mask oxygen  Post-op Assessment: Report given to RN and Post -op Vital signs reviewed and stable  Post vital signs: Reviewed and stable  Last Vitals:  Vitals Value Taken Time  BP 134/62 06/22/22 1024  Temp    Pulse 58 06/22/22 1027  Resp 18 06/22/22 1027  SpO2 100 % 06/22/22 1027  Vitals shown include unvalidated device data.  Last Pain:  Vitals:   06/22/22 0833  TempSrc: Temporal  PainSc: 0-No pain         Complications: No notable events documented.

## 2022-06-22 NOTE — Anesthesia Preprocedure Evaluation (Addendum)
Anesthesia Evaluation  Patient identified by MRN, date of birth, ID band Patient awake    Reviewed: Allergy & Precautions, NPO status , Patient's Chart, lab work & pertinent test results  Airway Mallampati: III  TM Distance: <3 FB Neck ROM: Full    Dental  (+) Teeth Intact, Dental Advisory Given   Pulmonary former smoker   Pulmonary exam normal breath sounds clear to auscultation       Cardiovascular hypertension, Pt. on medications + CAD, + Past MI and + CABG  Normal cardiovascular exam Rhythm:Regular Rate:Normal     Neuro/Psych TIA negative psych ROS   GI/Hepatic Neg liver ROS,,,rectal bleeding, abnormal CT ?ascending mass/inflammation   Endo/Other  diabetes, Type 2, Oral Hypoglycemic Agents    Renal/GU negative Renal ROS     Musculoskeletal  (+) Arthritis ,    Abdominal   Peds  Hematology  (+) Blood dyscrasia, anemia   Anesthesia Other Findings Day of surgery medications reviewed with the patient.  Reproductive/Obstetrics                             Anesthesia Physical Anesthesia Plan  ASA: 3  Anesthesia Plan: MAC   Post-op Pain Management:    Induction: Intravenous  PONV Risk Score and Plan: 1 and TIVA and Treatment may vary due to age or medical condition  Airway Management Planned: Natural Airway and Simple Face Mask  Additional Equipment:   Intra-op Plan:   Post-operative Plan:   Informed Consent: I have reviewed the patients History and Physical, chart, labs and discussed the procedure including the risks, benefits and alternatives for the proposed anesthesia with the patient or authorized representative who has indicated his/her understanding and acceptance.     Dental advisory given  Plan Discussed with: CRNA and Anesthesiologist  Anesthesia Plan Comments:        Anesthesia Quick Evaluation

## 2022-06-25 ENCOUNTER — Encounter (HOSPITAL_COMMUNITY): Payer: Self-pay | Admitting: Gastroenterology

## 2022-06-26 LAB — SURGICAL PATHOLOGY

## 2022-08-07 ENCOUNTER — Ambulatory Visit (HOSPITAL_COMMUNITY)
Admission: RE | Admit: 2022-08-07 | Discharge: 2022-08-07 | Disposition: A | Discharge status: Home / Self Care | Payer: Medicare Other | Source: Ambulatory Visit | Attending: Cardiovascular Disease | Admitting: Cardiovascular Disease

## 2022-08-07 ENCOUNTER — Ambulatory Visit (HOSPITAL_BASED_OUTPATIENT_CLINIC_OR_DEPARTMENT_OTHER): Payer: Medicare Other

## 2022-08-07 DIAGNOSIS — E785 Hyperlipidemia, unspecified: Secondary | ICD-10-CM | POA: Insufficient documentation

## 2022-08-07 DIAGNOSIS — E119 Type 2 diabetes mellitus without complications: Secondary | ICD-10-CM | POA: Diagnosis not present

## 2022-08-07 DIAGNOSIS — Z951 Presence of aortocoronary bypass graft: Secondary | ICD-10-CM

## 2022-08-07 DIAGNOSIS — I10 Essential (primary) hypertension: Secondary | ICD-10-CM | POA: Diagnosis not present

## 2022-08-07 DIAGNOSIS — I2510 Atherosclerotic heart disease of native coronary artery without angina pectoris: Secondary | ICD-10-CM | POA: Insufficient documentation

## 2022-08-07 DIAGNOSIS — R011 Cardiac murmur, unspecified: Secondary | ICD-10-CM | POA: Diagnosis not present

## 2022-08-07 DIAGNOSIS — Z8673 Personal history of transient ischemic attack (TIA), and cerebral infarction without residual deficits: Secondary | ICD-10-CM | POA: Diagnosis not present

## 2022-08-07 DIAGNOSIS — R0989 Other specified symptoms and signs involving the circulatory and respiratory systems: Secondary | ICD-10-CM | POA: Diagnosis not present

## 2022-08-07 DIAGNOSIS — Z87891 Personal history of nicotine dependence: Secondary | ICD-10-CM | POA: Diagnosis not present

## 2022-08-07 LAB — ECHOCARDIOGRAM COMPLETE
Area-P 1/2: 2.81 cm2
S' Lateral: 2.9 cm
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
## 2022-08-27 ENCOUNTER — Encounter: Payer: Self-pay | Admitting: Cardiovascular Disease

## 2022-08-27 ENCOUNTER — Ambulatory Visit: Payer: Medicare Other | Attending: Cardiovascular Disease | Admitting: Cardiovascular Disease

## 2022-08-27 VITALS — BP 97/60 | HR 58 | Ht 67.0 in | Wt 159.0 lb

## 2022-08-27 DIAGNOSIS — G458 Other transient cerebral ischemic attacks and related syndromes: Secondary | ICD-10-CM

## 2022-08-27 NOTE — Patient Instructions (Addendum)
Medication Instructions:  Your physician recommends that you continue on your current medications as directed. Please refer to the Current Medication list given to you today.  *If you need a refill on your cardiac medications before your next appointment, please call your pharmacy*  Lab Work: Your physician recommends that you have lab work today- BMET  If you have labs (blood work) drawn today and your tests are completely normal, you will receive your results only by: MyChart Message (if you have MyChart) OR A paper copy in the mail If you have any lab test that is abnormal or we need to change your treatment, we will call you to review the results.  Testing/Procedures: Your physician has requested that you have CTA  carotid/ subclavian computed tomography (CT) is a painless test that uses an x-ray machine to take clear, detailed pictures of your heart. For further information please visit [email or web address]. Please follow instruction sheet as given.  Follow-Up: At [facility], you and your health needs are our priority.  As part of our continuing mission to provide you with exceptional heart care, we have created designated Provider Care Teams.  These Care Teams include your primary Cardiologist (physician) and Advanced Practice Providers (APPs -  Physician Assistants and Nurse Practitioners) who all work together to provide you with the care you need, when you need it.  We recommend signing up for the patient portal called "MyChart".  Sign up information is provided on this After Visit Summary.  MyChart is used to connect with patients for Virtual Visits (Telemedicine).  Patients are able to view lab/test results, encounter notes, upcoming appointments, etc.  Non-urgent messages can be sent to your provider as well.   To learn more about what you can do with MyChart, go to [email or web address].    Your next appointment:   1 year(s)  Provider:   Jenkins Rouge, MD

## 2022-08-28 LAB — BASIC METABOLIC PANEL
BUN/Creatinine Ratio: 18 (ref 10–24)
BUN: 12 mg/dL (ref 8–27)
CO2: 23 mmol/L (ref 20–29)
Calcium: 9.3 mg/dL (ref 8.6–10.2)
Chloride: 102 mmol/L (ref 96–106)
Creatinine, Ser: 0.65 mg/dL — ABNORMAL LOW (ref 0.76–1.27)
Glucose: 179 mg/dL — ABNORMAL HIGH (ref 70–99)
Potassium: 4.8 mmol/L (ref 3.5–5.2)
Sodium: 139 mmol/L (ref 134–144)
eGFR: 102 mL/min/{1.73_m2} (ref >59)

## 2022-09-26 ENCOUNTER — Encounter (HOSPITAL_COMMUNITY): Payer: Self-pay

## 2022-09-26 ENCOUNTER — Ambulatory Visit (HOSPITAL_COMMUNITY)
Admission: RE | Admit: 2022-09-26 | Discharge: 2022-09-26 | Disposition: A | Discharge status: Home / Self Care | Payer: Medicare HMO | Source: Ambulatory Visit | Attending: Cardiovascular Disease | Admitting: Cardiovascular Disease

## 2022-09-26 DIAGNOSIS — G458 Other transient cerebral ischemic attacks and related syndromes: Secondary | ICD-10-CM | POA: Diagnosis present

## 2022-09-26 MED ORDER — SODIUM CHLORIDE (PF) 0.9 % IJ SOLN
INTRAMUSCULAR | Status: AC
  Filled 2022-09-26: qty 50

## 2022-09-26 MED ORDER — IOHEXOL 350 MG/ML SOLN
75.0000 mL | Freq: Once | INTRAVENOUS | Status: AC | PRN
  Administered 2022-09-26: 75 mL via INTRAVENOUS

## 2022-10-01 ENCOUNTER — Telehealth: Payer: Self-pay | Admitting: Cardiovascular Disease

## 2022-10-01 ENCOUNTER — Telehealth: Payer: Self-pay

## 2022-10-01 DIAGNOSIS — R935 Abnormal findings on diagnostic imaging of other abdominal regions, including retroperitoneum: Secondary | ICD-10-CM

## 2022-10-01 DIAGNOSIS — R9389 Abnormal findings on diagnostic imaging of other specified body structures: Secondary | ICD-10-CM

## 2022-10-01 NOTE — Telephone Encounter (Signed)
Jason Stade, MD  Ethelda Chick, RN; Nori Riis, RN; Maeola Harman, MD; Leonie Douglas, MD; Nada Libman, MD; 1 other Pam- can you make sure Jason Bailey gets in to see [facility] ASAP he has a lot of vertebral/subclavian dx Also make sure he f/u with GI for colonsocopy after his CT in January suggested possible colon cancer           Dr.Nishan has called patient.

## 2022-10-01 NOTE — Telephone Encounter (Signed)
Dr. Margo Aye from [facility] Radiology is calling to discuss CT.  Called DOD line at [employer] and was told to reach out to Dr. Eden Emms in [place].  Sent message to RN, Santina Evans who added Dr. Eden Emms to chat.  I then let Dr. Scharlene Gloss office know that Dr. Eden Emms had just stepped in with a patient and they would be available shortly. After waiting for a little bit, Dr. Scharlene Gloss office then requested a call back and gave two different #'s to call.  "Dr. Margo Aye requesting call back at Radiology Assistant line: [phone] or Cell: [phone]".

## 2022-10-01 NOTE — Telephone Encounter (Signed)
Per Dr. Eden Emms, [name]- can you make sure Jason Bailey gets in to see [facility] ASAP he has a lot of vertebral/subclavian dx Also make sure he f/u with GI for colonsocopy after his CT in January suggested possible colon cancer.  Called patient. Patient stated he had a colonoscopy on 06/22/22. Patient stated he has Diverticulosis, and test was done by Dr. Marca Ancona with [facility]. This was done 2 days after CT. Colonoscopy report is in chart. Placed referral to [facility] as urgent. Patient aware that [facility] will call him to schedule consult.

## 2022-10-04 ENCOUNTER — Encounter: Payer: Self-pay | Admitting: Vascular Surgery

## 2022-10-04 ENCOUNTER — Ambulatory Visit: Payer: Medicare HMO | Admitting: Vascular Surgery

## 2022-10-04 VITALS — BP 107/61 | HR 54 | Temp 97.9°F | Resp 20 | Ht 67.0 in | Wt 160.0 lb

## 2022-10-04 DIAGNOSIS — I6503 Occlusion and stenosis of bilateral vertebral arteries: Secondary | ICD-10-CM

## 2022-10-04 DIAGNOSIS — I771 Stricture of artery: Secondary | ICD-10-CM | POA: Diagnosis not present

## 2022-10-04 NOTE — Progress Notes (Signed)
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  He does not have any evidence of significant disease in the common carotid artery or internal carotid artery.  He does have an external carotid artery stenosis bilaterally which is clinically insignificant.  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  Currently however he is having minimal symptoms.  He gets occasional dizziness when he stands up quickly.  His disease in the subclavian is more significant on the right so his blood pressure should be taken in the left arm as it will be more accurate.  I do not think he is a good candidate for subclavian artery stenting on either side as the vertebral arteries have severe disease proximally so this would not significantly improve vertebrobasilar flow.  In addition, given the extent of the disease I think combined subclavian artery angioplasty and vertebral artery angioplasty would be technically very challenging and would be associated with significant risk.  If he developed worsening vertebrobasilar symptoms I think the best option would be a vertebral artery transposition into the common carotid artery.  I explained that this is an operation which we rarely do and is associated with some risk of nerve injury and lymphatic injury.  Given that he is essentially asymptomatic I would not recommend an aggressive approach to this unless he developed worsening vertebrobasilar symptoms.  He will continue his yearly follow-up studies with Dr. Eden Emms who will follow his carotid duplex studies.  I will be happy to see him back at any time if things change.  REASON FOR CONSULT:    Bilateral subclavian artery stenoses and vertebral artery stenoses.  The consult is requested by Dr. Eden Emms.   HPI:   Jason Bailey is a 72 y.o. male who has undergone  previous coronary revascularization.  He is followed closely by Dr. Eden Emms.  He underwent a routine carotid duplex study on 08/07/2022.  This did not show any significant disease of the internal carotid arteries bilaterally.  He was noted to have bilateral subclavian artery stenoses and atypical flow in both vertebral arteries.  This prompted a CT angio of the neck which was done on 09/26/2022 which showed evidence of a severe right subclavian artery stenosis and severe disease in the proximal right vertebral artery.  On the left side he had significant subclavian disease and also a proximal occlusion of the left vertebral artery.  He was referred for vascular consultation.  He has a remote history of a TIA 5 or 6 years ago but cannot remember the details.  He is right-handed.  He denies any previous history of stroke, expressive or receptive aphasia, or amaurosis fugax.  He rarely gets some dizziness.  This usually occurs when he sits up quickly.  He is right-handed.  He has had previous right shoulder surgery and states that he thinks his right arm is slightly weaker than the left.  He has also had an injury to his right leg in the past and has significant muscle atrophy on the right calf.  He denies any claudication or rest pain.  He quit smoking 5 or 6 years ago.  He does have a history of diabetes and hypertension.  He denies any family history of premature cardiovascular disease.  Past Medical History:  Diagnosis Date   Chest pain    Coronary artery disease    Diabetes mellitus  Heart attack    approx. 2009   Hyperlipidemia    Hypertension    Malaise and fatigue    Osteoarthritis    Right foot drop    Right shoulder pain    Vision abnormalities     Family History  Problem Relation Age of Onset   Breast cancer Mother    Liver cancer Father    Renal cancer Sister    Diabetes Maternal Grandfather     SOCIAL HISTORY: Social History   Tobacco Use   Smoking status: Former    Smokeless tobacco: Never   Tobacco comments:    quit 4 yrs ago  Substance Use Topics   Alcohol use: No    No Known Allergies  Current Outpatient Medications  Medication Sig Dispense Refill   aspirin EC 81 MG tablet Take 1 tablet (81 mg total) by mouth daily. (Patient taking differently: Take 81 mg by mouth every other day.)     cholecalciferol (VITAMIN D) 1000 units tablet Take 1,000 Units by mouth daily.     empagliflozin (JARDIANCE) 25 MG TABS tablet Take 25 mg by mouth daily.     fluticasone (FLONASE) 50 MCG/ACT nasal spray Place 2 sprays into both nostrils as needed for allergies.     lisinopril (PRINIVIL,ZESTRIL) 10 MG tablet Take 10 mg by mouth at bedtime.     metFORMIN (GLUCOPHAGE-XR) 500 MG 24 hr tablet Take 2 tablets (1,000 mg total) by mouth 2 (two) times daily. 120 tablet 1   montelukast (SINGULAIR) 10 MG tablet Take 10 mg by mouth daily.     nitroGLYCERIN (NITROSTAT) 0.4 MG SL tablet Place 1 tablet (0.4 mg total) under the tongue every 5 (five) minutes as needed for chest pain. 25 tablet 3   polyethylene glycol (MIRALAX / GLYCOLAX) 17 g packet Take 17 g by mouth daily. 14 each 0   pravastatin (PRAVACHOL) 40 MG tablet Take 1 tablet (40 mg total) by mouth daily. (Patient taking differently: Take 40 mg by mouth at bedtime.) 30 tablet 1   vitamin B-12 (CYANOCOBALAMIN) 500 MCG tablet Take 500 mcg by mouth daily.     glimepiride (AMARYL) 2 MG tablet Take 2 mg by mouth daily.     No current facility-administered medications for this visit.    REVIEW OF SYSTEMS:   denotes positive finding,  denotes negative finding Cardiac  Comments:  Chest pain or chest pressure:    Shortness of breath upon exertion:    Short of breath when lying flat:    Irregular heart rhythm:        Vascular    Pain in calf, thigh, or hip brought on by ambulation:    Pain in feet at night that wakes you up from your sleep:     Blood clot in your veins:    Leg swelling:         Pulmonary     Oxygen at home:    Productive cough:     Wheezing:         Neurologic    Sudden weakness in arms or legs:     Sudden numbness in arms or legs:     Sudden onset of difficulty speaking or slurred speech:    Temporary loss of vision in one eye:     Problems with dizziness:  x       Gastrointestinal    Blood in stool:     Vomited blood:         Genitourinary  Burning when urinating:     Blood in urine:        Psychiatric    Major depression:         Hematologic    Bleeding problems:    Problems with blood clotting too easily:        Skin    Rashes or ulcers:        Constitutional    Fever or chills:    -  PHYSICAL EXAM:   Vitals:   10/04/22 0900 10/04/22 0902  BP: (!) 151/69 107/61  Pulse: (!) 54   Resp: 20   Temp: 97.9 F (36.6 C)   SpO2: 98%   Weight: 160 lb (72.6 kg)   Height:  (1.702 m)    Body mass index is 25.06 kg/m. GENERAL: The patient is a well-nourished male, in no acute distress. The vital signs are documented above. CARDIAC: There is a regular rate and rhythm.  He has a systolic murmur. VASCULAR: He has bilateral carotid bruits. He has a diminished right radial pulse and a normal left radial pulse. He has palpable femoral pulses. On the right side he has a monophasic dorsalis pedis and posterior tibial signal. On the left side he has a biphasic posterior tibial signal with a monophasic dorsalis pedis signal. PULMONARY: There is good air exchange bilaterally without wheezing or rales. ABDOMEN: Soft and non-tender with normal pitched bowel sounds.  He has an abdominal hernia.  I do not palpate an aneurysm. MUSCULOSKELETAL: There are no major deformities. NEUROLOGIC: No focal weakness or paresthesias are detected. SKIN: There are no ulcers or rashes noted. PSYCHIATRIC: The patient has a normal affect.  DATA:    CAROTID DUPLEX: I have reviewed the carotid duplex scan that was done on 08/07/2022.  On the right side there was no significant  carotid stenosis.  There was a stenosis in the external carotid artery.  There was significant stenosis noted in the right subclavian artery.  Flow in the right vertebral artery was antegrade although atypical.  On the left side there was no significant carotid stenosis.  There was a stenosis in the external carotid artery.  There was also some stenosis in the left subclavian artery.  The vertebral artery was antegrade with atypical flow.  CT ANGIO NECK: I have reviewed the CT angio of the neck that was done on 09/26/2022.  This showed severe bilateral subclavian artery stenoses which was more significant on the right side.  There was also severe stenosis in the proximal right vertebral artery with calcific plaque.  The plaque on the left side was less significant however it occluded the proximal left vertebral artery.  The left vertebral artery reconstituted in the V2 segment.   Waverly [facility]

## 2023-03-19 ENCOUNTER — Telehealth: Payer: Self-pay

## 2023-03-19 NOTE — Telephone Encounter (Signed)
Left message for patient to call back. Left message with appointment information.

## 2023-03-19 NOTE — Telephone Encounter (Signed)
-----   Message from Charlton Haws sent at 03/19/2023  8:43 AM EDT ----- Subclavian steal symtoms Add him on to my 11/5 schedule at 2:30 thanks

## 2023-03-19 NOTE — Telephone Encounter (Signed)
Attempted phone call to pt to advise of appointment per Dr Eden Emms.  Line is busy at this time.

## 2023-04-15 ENCOUNTER — Telehealth: Payer: Self-pay

## 2023-04-15 DIAGNOSIS — G458 Other transient cerebral ischemic attacks and related syndromes: Secondary | ICD-10-CM
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
## 2023-04-15 NOTE — Telephone Encounter (Signed)
Placed referral. Patient has appointment tomorrow with Dr. Eden Emms.

## 2023-04-15 NOTE — Telephone Encounter (Signed)
-----   Message from Charlton Haws sent at 04/15/2023  1:39 PM EST ----- Needs referral to [facility] former [facility] patient last seen 09/2022 complex subclavian/vertebral dx

## 2023-04-16 ENCOUNTER — Ambulatory Visit: Payer: Medicare HMO | Attending: Cardiovascular Disease | Admitting: Cardiovascular Disease

## 2023-04-16 ENCOUNTER — Encounter: Payer: Self-pay | Admitting: Cardiovascular Disease

## 2023-04-16 VITALS — BP 122/66 | HR 68 | Resp 16 | Ht 67.0 in | Wt 157.4 lb

## 2023-04-16 DIAGNOSIS — G458 Other transient cerebral ischemic attacks and related syndromes: Secondary | ICD-10-CM

## 2023-04-16 DIAGNOSIS — R0989 Other specified symptoms and signs involving the circulatory and respiratory systems: Secondary | ICD-10-CM | POA: Diagnosis not present

## 2023-04-16 DIAGNOSIS — Z951 Presence of aortocoronary bypass graft: Secondary | ICD-10-CM

## 2023-04-16 NOTE — Patient Instructions (Addendum)
Medication Instructions:  Your physician recommends that you continue on your current medications as directed. Please refer to the Current Medication list given to you today.  *If you need a refill on your cardiac medications before your next appointment, please call your pharmacy*  Lab Work: If you have labs (blood work) drawn today and your tests are completely normal, you will receive your results only by: MyChart Message (if you have MyChart) OR A paper copy in the mail If you have any lab test that is abnormal or we need to change your treatment, we will call you to review the results.  Testing/Procedures: None ordered today.  Follow-Up: At [facility], you and your health needs are our priority.  As part of our continuing mission to provide you with exceptional heart care, we have created designated Provider Care Teams.  These Care Teams include your primary Cardiologist (physician) and Advanced Practice Providers (APPs -  Physician Assistants and Nurse Practitioners) who all work together to provide you with the care you need, when you need it.  We recommend signing up for the patient portal called "MyChart".  Sign up information is provided on this After Visit Summary.  MyChart is used to connect with patients for Virtual Visits (Telemedicine).  Patients are able to view lab/test results, encounter notes, upcoming appointments, etc.  Non-urgent messages can be sent to your provider as well.   To learn more about what you can do with MyChart, go to [email or web address].    Your next appointment:   6 month(s)  Provider:   Charlton Haws, MD     Referral placed on 04/15/23 to see Vein and Vascular Surgery.

## 2023-04-25 ENCOUNTER — Other Ambulatory Visit: Payer: Self-pay | Admitting: Urology

## 2023-05-23 NOTE — Progress Notes (Addendum)
COVID Vaccine received:  []  No [x]  Yes Date of any COVID positive Test in last 90 days: no PCP - Tacey Ruiz FNP Cardiologist -Charlton Haws MD   Chest x-ray -  EKG -  04/16/23 EPIC Stress Test - 2014 ECHO - 08/07/22 Epic Cardiac Cath -   Bowel Prep - [x]  No  []   Yes ______  Pacemaker / ICD device [x]  No []  Yes   Spinal Cord Stimulator:[x]  No []  Yes       History of Sleep Apnea? []  No [x]  Yes   CPAP used?- [x]  No []  Yes    Does the patient monitor blood sugar?          []  No [x]  Yes  []  N/A  Patient has: []  NO Hx DM   []  Pre-DM                 []  DM1  [x]   DM2 Does patient have a [employer] or Dexacom? []  No []  Yes   Fasting Blood Sugar Ranges- 140's Checks Blood Sugar __1___ times a week  GLP1 agonist / usual dose - no GLP1 instructions:  SGLT-2 inhibitors / usual dose - no SGLT-2 instructions:   Blood Thinner / Instructions:no Aspirin Instructions:ASA 81 mg will continue to take per MD  Comments:   Activity level: Patient is able  to climb a flight of stairs without difficulty; [x]  No CP  [x]  No SOB,    Patient can /  perform ADLs without assistance.   Anesthesia review: HTN, DM,CABG 2008, murmur  Patient denies shortness of breath, fever, cough and chest pain at PAT appointment.  Patient verbalized understanding and agreement to the Pre-Surgical Instructions that were given to them at this PAT appointment. Patient was also educated of the need to review these PAT instructions again prior to his/her surgery.I reviewed the appropriate phone numbers to call if they have any and questions or concerns.

## 2023-05-23 NOTE — Patient Instructions (Signed)
SURGICAL WAITING ROOM VISITATION  Patients having surgery or a procedure may have no more than 2 support people in the waiting area - these visitors may rotate.    Children under the age of 80 must have an adult with them who is not the patient.  Due to an increase in RSV and influenza rates and associated hospitalizations, children ages 69 and under may not visit patients in [facility] hospitals.  If the patient needs to stay at the hospital during part of their recovery, the visitor guidelines for inpatient rooms apply. Pre-op nurse will coordinate an appropriate time for 1 support person to accompany patient in pre-op.  This support person may not rotate.    Please refer to the [facility] website for the visitor guidelines for Inpatients (after your surgery is over and you are in a regular room).       Your procedure is scheduled on: 05/28/23   Report to [facility] Main Entrance    Report to admitting at 8:30 AM   Call this number if you have problems the morning of surgery [phone]   Do not eat food  or drink liquids :After Midnight.         Oral Hygiene is also important to reduce your risk of infection.                                    Remember - BRUSH YOUR TEETH THE MORNING OF SURGERY WITH YOUR REGULAR TOOTHPASTE  DENTURES WILL BE REMOVED PRIOR TO SURGERY PLEASE DO NOT APPLY "Poly grip" OR ADHESIVES!!!   Stop all vitamins and herbal supplements 7 days before surgery.   Take these medicines the morning of surgery with A SIP OF WATER: Singulair  DO NOT TAKE ANY ORAL DIABETIC MEDICATIONS DAY OF YOUR SURGERY HOLD METFORMIN THE DAY OF SURGERY. DO NOT TAKE GLIMEPIRIDE THE DAY OF SURGERY HOLD JARDIANCE FOR 72 HOURS PRIOR TO SURGERY.              You may not have any metal on your body including hair pins, jewelry, and body piercing             Do not wear make-up, lotions, powders, perfumes/cologne, or deodorant              Men may shave face and  neck.   Do not bring valuables to the hospital. [facility] IS NOT             RESPONSIBLE   FOR VALUABLES.   Contacts, glasses, dentures or bridgework may not be worn into surgery.  DO NOT BRING YOUR HOME MEDICATIONS TO THE HOSPITAL. PHARMACY WILL DISPENSE MEDICATIONS LISTED ON YOUR MEDICATION LIST TO YOU DURING YOUR ADMISSION IN THE HOSPITAL!    Patients discharged on the day of surgery will not be allowed to drive home.  Someone NEEDS to stay with you for the first 24 hours after anesthesia.   Special Instructions: Bring a copy of your healthcare power of attorney and living will documents the day of surgery if you haven't scanned them before.              Please read over the following fact sheets you were given: IF YOU HAVE QUESTIONS ABOUT YOUR PRE-OP INSTRUCTIONS PLEASE CALL [phone]   If you received a COVID test during your pre-op visit  it is requested that you wear a mask when  out in public, stay away from anyone that may not be feeling well and notify your surgeon if you develop symptoms. If you test positive for Covid or have been in contact with anyone that has tested positive in the last 10 days please notify you surgeon.     - Preparing for Surgery Before surgery, you can play an important role.  Because skin is not sterile, your skin needs to be as free of germs as possible.  You can reduce the number of germs on your skin by washing with CHG (chlorahexidine gluconate) soap before surgery.  CHG is an antiseptic cleaner which kills germs and bonds with the skin to continue killing germs even after washing. Please DO NOT use if you have an allergy to CHG or antibacterial soaps.  If your skin becomes reddened/irritated stop using the CHG and inform your nurse when you arrive at Short Stay. Do not shave (including legs and underarms) for at least 48 hours prior to the first CHG shower.  You may shave your face/neck.  Please follow these instructions carefully:  1.   Shower with CHG Soap the night before surgery and the  morning of surgery.  2.  If you choose to wash your hair, wash your hair first as usual with your normal  shampoo.  3.  After you shampoo, rinse your hair and body thoroughly to remove the shampoo.                             4.  Use CHG as you would any other liquid soap.  You can apply chg directly to the skin and wash.  Gently with a scrungie or clean washcloth.  5.  Apply the CHG Soap to your body ONLY FROM THE NECK DOWN.   Do   not use on face/ open                           Wound or open sores. Avoid contact with eyes, ears mouth and   genitals (private parts).                       Wash face,  Genitals (private parts) with your normal soap.             6.  Wash thoroughly, paying special attention to the area where your    surgery  will be performed.  7.  Thoroughly rinse your body with warm water from the neck down.  8.  DO NOT shower/wash with your normal soap after using and rinsing off the CHG Soap.                9.  Pat yourself dry with a clean towel.            10.  Wear clean pajamas.            11.  Place clean sheets on your bed the night of your first shower and do not  sleep with pets. Day of Surgery : Do not apply any lotions/deodorants the morning of surgery.  Please wear clean clothes to the hospital/surgery center.  FAILURE TO FOLLOW THESE INSTRUCTIONS MAY RESULT IN THE CANCELLATION OF YOUR SURGERY  PATIENT SIGNATURE_________________________________  NURSE SIGNATURE__________________________________  ________________________________________________________________________

## 2023-05-24 ENCOUNTER — Encounter (HOSPITAL_COMMUNITY)
Admission: RE | Admit: 2023-05-24 | Discharge: 2023-05-24 | Disposition: A | Discharge status: Home / Self Care | Payer: Medicare HMO | Source: Ambulatory Visit | Attending: Urology | Admitting: Urology

## 2023-05-24 ENCOUNTER — Encounter (HOSPITAL_COMMUNITY): Payer: Self-pay

## 2023-05-24 ENCOUNTER — Other Ambulatory Visit: Payer: Self-pay

## 2023-05-24 VITALS — BP 166/61 | HR 71 | Temp 98.0°F | Resp 16 | Ht 67.0 in | Wt 160.0 lb

## 2023-05-24 DIAGNOSIS — D494 Neoplasm of unspecified behavior of bladder: Secondary | ICD-10-CM | POA: Diagnosis not present

## 2023-05-24 DIAGNOSIS — I10 Essential (primary) hypertension: Secondary | ICD-10-CM | POA: Insufficient documentation

## 2023-05-24 DIAGNOSIS — Z87891 Personal history of nicotine dependence: Secondary | ICD-10-CM | POA: Diagnosis not present

## 2023-05-24 DIAGNOSIS — Z951 Presence of aortocoronary bypass graft: Secondary | ICD-10-CM | POA: Insufficient documentation

## 2023-05-24 DIAGNOSIS — Z01812 Encounter for preprocedural laboratory examination: Secondary | ICD-10-CM | POA: Diagnosis present

## 2023-05-24 DIAGNOSIS — I2510 Atherosclerotic heart disease of native coronary artery without angina pectoris: Secondary | ICD-10-CM | POA: Insufficient documentation

## 2023-05-24 DIAGNOSIS — E119 Type 2 diabetes mellitus without complications: Secondary | ICD-10-CM | POA: Diagnosis not present

## 2023-05-24 HISTORY — DX: Malignant (primary) neoplasm, unspecified: C80.1

## 2023-05-24 HISTORY — DX: Sleep apnea, unspecified: G47.30

## 2023-05-24 HISTORY — DX: Personal history of urinary calculi: Z87.442

## 2023-05-24 HISTORY — DX: Cardiac murmur, unspecified: R01.1

## 2023-05-24 LAB — BASIC METABOLIC PANEL
Anion gap: 9 (ref 5–15)
BUN: 11 mg/dL (ref 8–23)
CO2: 26 mmol/L (ref 22–32)
Calcium: 8.5 mg/dL — ABNORMAL LOW (ref 8.9–10.3)
Chloride: 99 mmol/L (ref 98–111)
Creatinine, Ser: 0.66 mg/dL (ref 0.61–1.24)
GFR, Estimated: >60 mL/min (ref >60)
Glucose, Bld: 135 mg/dL — ABNORMAL HIGH (ref 70–99)
Potassium: 3.4 mmol/L — ABNORMAL LOW (ref 3.5–5.1)
Sodium: 134 mmol/L — ABNORMAL LOW (ref 135–145)

## 2023-05-24 LAB — CBC
HCT: 41 % (ref 39.0–52.0)
Hemoglobin: 14 g/dL (ref 13.0–17.0)
MCH: 32.4 pg (ref 26.0–34.0)
MCHC: 34.1 g/dL (ref 30.0–36.0)
MCV: 94.9 fL (ref 80.0–100.0)
Platelets: 229 10*3/uL (ref 150–400)
RBC: 4.32 MIL/uL (ref 4.22–5.81)
RDW: 13.9 % (ref 11.5–15.5)
WBC: 7.5 10*3/uL (ref 4.0–10.5)
nRBC: 0 % (ref 0.0–0.2)

## 2023-05-24 LAB — HEMOGLOBIN A1C
Hgb A1c MFr Bld: 7.4 % — ABNORMAL HIGH (ref 4.8–5.6)
Mean Plasma Glucose: 165.68 mg/dL

## 2023-05-24 LAB — GLUCOSE, CAPILLARY: Glucose-Capillary: 166 mg/dL — ABNORMAL HIGH (ref 70–99)

## 2023-05-27 NOTE — Progress Notes (Signed)
Anesthesia Chart Review   Case: [id number] Date/Time: 05/28/23 1030   Procedure: TRANSURETHRAL RESECTION OF BLADDER TUMOR (TURBT) with GEMCITABINE   Anesthesia type: General   Pre-op diagnosis: BLADDER TUMOR   Location: [facility] / [facility]   Surgeons: Bjorn Pippin, MD       DISCUSSION:72 y.o. former smoker with h/o HTN, sleep apnea, CAD s/p CABG 2008, DM II, bladder tumor scheduled for above procedure 05/28/23 with Dr. Bjorn Pippin.   Pt last seen by cardiology 04/16/2023.  Pt OV note no angina.  In regards to carotid bruit, "1-39% bilateral plaque duplex 08/07/22 Has right subclavian velocity of 4.44cm/sec with atypical vertebral flow  on left and 5.17m/sec and atypical antegrade flow on right with 45 mm diff in BP CTA 09/30/22 with string sign right subclavian and left subclavian stenosis with reconstituted vertebral Seen by Dr Durwin Nora Ap[ril and felt stenting /PCI of subclavian and vertebrals would be difficult Would need bypass and transposition of vertebrals. Not sure this surgery is done here Refer back to [facility] Dr Durwin Nora is now retired"  Per previous notes from Dr. Edilia Bo given that pt is asymptomatic would not recommend aggressive surgical approach unless he was developing worsening vertebrobasilar sx.  He is scheduled for yearly carotid duplex studies and follow up with Dr. Edilia Bo.   VS: BP (!) 166/61   Pulse 71   Temp 36.7 C (Oral)   Resp 16   Ht 5\' 7"  (1.702 m)   Wt 72.6 kg   SpO2 100%   BMI 25.06 kg/m   PROVIDERS: Dolleschel, Irving Burton, FNP is PCP   Charlton Haws, MD is Cardiologist  LABS: Labs reviewed: Acceptable for surgery. (all labs ordered are listed, but only abnormal results are displayed)  Labs Reviewed  HEMOGLOBIN A1C - Abnormal; Notable for the following components:      Result Value   Hgb A1c MFr Bld 7.4 (*)    All other components within normal limits  BASIC METABOLIC PANEL - Abnormal; Notable for the following components:   Sodium 134 (*)    Potassium 3.4  (*)    Glucose, Bld 135 (*)    Calcium 8.5 (*)    All other components within normal limits  GLUCOSE, CAPILLARY - Abnormal; Notable for the following components:   Glucose-Capillary 166 (*)    All other components within normal limits  CBC     IMAGES:   EKG:   CV: Echo 08/07/22 1. Left ventricular ejection fraction, by estimation, is 60 to 65%. The  left ventricle has normal function. The left ventricle has no regional  wall motion abnormalities. There is mild left ventricular hypertrophy.  Left ventricular diastolic parameters  are consistent with Grade II diastolic dysfunction (pseudonormalization).   2. Right ventricular systolic function is normal. The right ventricular  size is normal. Tricuspid regurgitation signal is inadequate for assessing  PA pressure.   3. Left atrial size was mildly dilated.   4. No evidence of mitral valve regurgitation.   5. Aortic valve regurgitation is not visualized.   6. The inferior vena cava is normal in size with greater than 50%  respiratory variability, suggesting right atrial pressure of 3 mmHg.   Comparison(s): No significant change from prior study.  Past Medical History:  Diagnosis Date   Cancer [facility])    prostate   Chest pain    Coronary artery disease    Diabetes mellitus    Heart attack (HCC)    approx. 2009   Heart murmur  History of kidney stones    Hyperlipidemia    Hypertension    Malaise and fatigue    Osteoarthritis    Right foot drop    Right shoulder pain    Sleep apnea    Vision abnormalities     Past Surgical History:  Procedure Laterality Date   ANTERIOR CERVICAL DECOMP/DISCECTOMY FUSION N/A 03/26/2019   Procedure: ANTERIOR CERVICAL DECOMPRESSION/DISCECTOMY FUSION ONE LEVEL, CERVICAL FIVE - CERVICAL SIX;  Surgeon: Shirlean Kelly, MD;  Location: [facility] OR;  Service: Neurosurgery;  Laterality: N/A;  ANTERIOR CERVICAL DECOMPRESSION/DISCECTOMY FUSION ONE LEVEL, CERVICAL FIVE - CERVICAL SIX    CAD      disease status post CABG x 4 on 02/14/2007   COLONOSCOPY WITH PROPOFOL N/A 06/22/2022   Procedure: COLONOSCOPY WITH PROPOFOL;  Surgeon: Kerin Salen, MD;  Location: [facility];  Service: Gastroenterology;  Laterality: N/A;   KNEE SURGERY     POLYPECTOMY  06/22/2022   Procedure: POLYPECTOMY;  Surgeon: Kerin Salen, MD;  Location: [facility];  Service: Gastroenterology;;    MEDICATIONS:  aspirin EC 81 MG tablet   empagliflozin (JARDIANCE) 25 MG TABS tablet   glimepiride (AMARYL) 4 MG tablet   lisinopril (ZESTRIL) 10 MG tablet   lisinopril (ZESTRIL) 20 MG tablet   metFORMIN (GLUCOPHAGE-XR) 500 MG 24 hr tablet   montelukast (SINGULAIR) 10 MG tablet   nitroGLYCERIN (NITROSTAT) 0.4 MG SL tablet   pravastatin (PRAVACHOL) 40 MG tablet   vitamin B-12 (CYANOCOBALAMIN) 500 MCG tablet   No current facility-administered medications for this encounter.   Jodell Cipro Ward, PA-C [facility] [phone]

## 2023-05-27 NOTE — H&P (Signed)
I have prostate cancer.  HPI: Jason Bailey is a 72 year-old male established patient who is here evaluation for treatment of prostate cancer.  His prostate cancer was diagnosed approximately 01/09/2009. His most recent PSA is <0.015.   He does not have urinary incontinence. He does not have problems with erectile dysfunction. He has not recently had unwanted weight loss. He is not having pain in new locations.   04/24/23: Toree returns today in f/u. He had a CT stone study that showed a small left renal stone but no ureteral or bladder stones.There is a possible metallic clip or seed in the RLP. THe prostate seeds are present. He has some bilateral low back pain. He has some residual burning. He has had no gross hematuria. He has some urgency.   04/09/23: He has a history of a seed implant on 07/21/09 for Gleason 6 prostate cancer. His PSA was <0.015 at his last visit. He will get labs today. He is voiding well with an IPSS of 10. His stream is occasionally weak. He has had no hematuria but did have some rectal bleeding over the past year and was found to have a diverticular bleed. He has some increased issues with constipation and has been needing a laxative at times.   He'll have occasional SUI that is mild with strenuous activity only and he has rare UUI if he is not careful. Nocturia 1. He doesn't need a pad.   03/06/23: Mr. Roger Kill is a 72 year old man who presents today with concerns of difficulty voiding. This is also associated with dysuria, and low back pain. He does endorse some straining on urination. Urinalysis without concerning parameters.   04/09/2023: 72 year old man with a past medical history as noted above presents today for follow-up. He endorses frequency of urination, dysuria and urgency. At last office visit imaging did not any concerning ureteral stones or hydronephrosis. He continues to endorse that this feels like a potential stone event. He tried tamsulosin but reports  dizziness so stopped this medication.     AUA Symptom Score: He never has the sensation of not emptying his bladder completely after finishing urinating. 50% of the time he has to urinate again fewer than two hours after he has finished urinating. Less than 20% of the time he has to start and stop again several times when he urinates. 50% of the time he finds it difficult to postpone urination. Less than 50% of the time he has a weak urinary stream. He never has to push or strain to begin urination. He has to get up to urinate 1 time from the time he goes to bed until the time he gets up in the morning.   Calculated AUA Symptom Score: 10    ALLERGIES: Lipitor TABS Toprol XL TB24    MEDICATIONS: Flomax 0.4 mg capsule 1 capsule PO Q HS  Aspirin 81 MG TABS Oral  B-12 TABS Oral  Jardiance  Lisinopril 10 mg tablet Oral  Metformin Hcl Er 500 mg tablet, extended release 24 hr 0 Oral  Pravastatin Sodium 40 mg tablet Oral  Testosterone Cypionate 200 mg/ml vial 0.5 ml IM Q1WK     GU PSH: No GU PSH      PSH Notes: Heart Surgery, Shoulder Surgery, Coronary Artery Quadruple Venous Bypass Graft   NON-GU PSH: Back Surgery (Unspecified) - 2020 Cabg; Vein; Four - 2010 Shoulder Surgery (Unspecified), Right - 2023 Visit Complexity (formerly GPC1X) - 09/10/2022     GU PMH: Dysuria - 04/09/2023 History  of prostate cancer - 04/09/2023, - 03/06/2023, His last PSA was undetectible and I will get that again today. f/u 1 years. , - 09/10/2022, He is doing well and his PSA remained undetectible at last check. I will get labs today and when he returns in a year. , - 2023, He is doing well and his psa's have been undetectible. He will get labs today. f/u in 6 months. , - 2022, He is doing well and his last PSA was undectable. I will get labs today and at f/u in 6 months. , - 2022, He is doing well and his PSA's have remained undetectible. He will have that done today and when he returns in 6 months. , - 2021  (Stable), His last PSA was undetectible and will be repeated today. , - 2020, He is doing well without new complaints. PSA today. Return in 6 months., - 2019, History of malignant neoplasm of prostate, - 2017, Prostate Cancer, - 2014 Urinary Frequency - 04/09/2023 Low back pain - 03/06/2023 Renal calculus - 03/06/2023 Urge incontinence - 03/06/2023, - 09/10/2022, He has occasional issues but is not bothered by it. , - 2022, Urge Incontinence Of Urine, - 2014 ED due to arterial insufficiency, He is functional but is having increased issues but isn't interested in therapy. - 09/10/2022, He continues to do well with the TRT. , - 2023, He is functional. , - 2022, Erectile dysfunction due to arterial insufficiency, - 2017 Primary hypogonadism, I have refilled the med and will check labs. - 09/10/2022, He continues to do well on TRT. Med refilled. , - 2023, He is doing well on TRT and had a script sent in last month when his appointment was delayed., - 2022, Labs today. he got a reflill in January between visits. , - 2022, His testosterone was refilled on 6/28. He just got it so the labs today will be a trough level. I will get a testosterone and H&H. , - 2021 (Stable), I have refilled the testosterone and will get labs. , - 2020, T and H&H today. Med refilled. , - 2019 (Stable), The testosterone and H&H were normal at the last visit and will be checked to day. Testosterone refilled. , - 2019, - 2018 (Stable), He is doing well on TRT but may want to shorten the interval. He had a borderline Hgb on his last visit. I will check that today and discussed blood draw. I have refill the testosterone. , - 2018 (Stable), - 2017, Hypogonadism, testicular, - 2017 Stress Incontinence, He has stable SUI with some urgency and UUI. - 09/10/2022, He has stable mild SUI. , - 2023, He will leak a little if he coughs. , - 2022 Prostate Cancer - 2018 Elevated PSA, PSA,Elevated - 2014 Nocturia, Nocturia - 2014 Prostate nodule w/o LUTS,  Prostate Hard Area Or Nodule - 2014      PMH Notes:  2009-06-08 16:03:13 - Note: Acute Myocardial Infarction  2009-06-08 16:03:13 - Note: Arthritis   NON-GU PMH: Encounter for general adult medical examination without abnormal findings, Encounter for preventive health examination - 2017 Generalized enlarged lymph nodes, Lymphadenopathy - 2014 Personal history of other diseases of the circulatory system, History of cardiac disorder - 2014 Personal history of other endocrine, nutritional and metabolic disease, History of diabetes mellitus - 2014 Personal history of other specified conditions, History of urinary frequency - 2014    FAMILY HISTORY: Breast Cancer - Mother Death In The Family Father - Father Death In The Family Mother - Mother  Family Health Status Number - Runs In Family liver cancer - Father   SOCIAL HISTORY: Marital Status: Married Preferred Language: English; Ethnicity: Not Hispanic Or Latino; Race: White Current Smoking Status: Patient does not smoke anymore.   Tobacco Use Assessment Completed: Used Tobacco in last 30 days? Social Drinker.  Drinks 4+ caffeinated drinks per day.     Notes: Tobacco use, Former smoker, Occupation:, Alcohol Use, Caffeine Use, Marital History - Currently Married   REVIEW OF SYSTEMS:    GU Review Male:   Patient reports frequent urination, burning/ pain with urination, and get up at night to urinate. Patient denies hard to postpone urination, leakage of urine, stream starts and stops, trouble starting your stream, have to strain to urinate , erection problems, and penile pain.  Gastrointestinal (Upper):   Patient denies nausea, vomiting, and indigestion/ heartburn.  Gastrointestinal (Lower):   Patient denies diarrhea and constipation.  Constitutional:   Patient denies fever, night sweats, weight loss, and fatigue.  Skin:   Patient denies skin rash/ lesion and itching.  Eyes:   Patient denies blurred vision and double vision.  Ears/ Nose/  Throat:   Patient denies sore throat and sinus problems.  Hematologic/Lymphatic:   Patient denies swollen glands and easy bruising.  Cardiovascular:   Patient denies leg swelling and chest pains.  Respiratory:   Patient denies cough and shortness of breath.  Endocrine:   Patient denies excessive thirst.  Musculoskeletal:   Patient reports back pain. Patient denies joint pain.  Neurological:   Patient denies headaches and dizziness.  Psychologic:   Patient denies depression and anxiety.   VITAL SIGNS: None   MULTI-SYSTEM PHYSICAL EXAMINATION:    Constitutional: Well-nourished. No physical deformities. Normally developed. Good grooming.  Respiratory: Normal breath sounds. No labored breathing, no use of accessory muscles.   Cardiovascular: Regular rate and rhythm. No murmur, no gallop.      Complexity of Data:  Urine Test Review:   Urinalysis  X-Ray Review: [facility] Protocol: Reviewed Films. Reviewed Report. Discussed With Patient.     09/10/22 09/06/21 03/10/21 08/17/20 12/21/19 05/22/19 11/17/18 05/19/18  PSA  Total PSA <0.015 ng/mL <0.015 ng/mL <0.015 ng/mL <0.015 ng/mL <0.015 ng/mL <0.015 ng/mL <0.015 ng/mL <0.015 ng/mL    09/10/22 09/06/21 03/10/21 08/17/20 12/21/19 05/22/19 11/17/18 05/19/18  Hormones  Testosterone, Total 139.8 ng/dL 098.1 ng/dL 19.1 ng/dL 478.2 ng/dL 956.2 ng/dL 13.0 ng/dL 865.7 ng/dL 84.6 ng/dL    PROCEDURES:         Flexible Cystoscopy - 52000  Risks, benefits, and some of the potential complications of the procedure were discussed. He was prepped with betadine and the urethral was instilled with 2% lidocaine jelly. Cipro 500mg  given for antibiotic prophylaxis.     Meatus:  Normal size. Normal location. Normal condition.  Urethra:  No strictures.  External Sphincter:  Normal.  Verumontanum:  Normal.  Prostate:  Borderline obstructing. Mild hyperplasia. radiation blanching of the mucosa.   Bladder Neck:  Non-obstructing.  Ureteral Orifices:  Normal  location. Normal size. Normal shape. Effluxed clear urine.  Bladder:  Mild trabeculation. there is a small papillary lesion superior to the right trigone with some associated velvety mucosa extending posteriorly for about 1.5 - 2cm . No stones.      The procedure was well tolerated and there were no complications.         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 I will get him set up for cystoscopy with a TURBT and with gemcitabine instillation. I have reviewed the risks of the procedure including bleeding, infection, bladder and ureteral injury, incontinence, chemical cystitis, thrombotic events and anesthetic complications.   2   Microscopic hematuria - R31.21 Minor, Resolved  3   History of prostate cancer - Z85.46 Chronic, Stable   PLAN:           Schedule Return Visit/Planned Activity: Keep Scheduled Appointment - Schedule Surgery  Procedure: Unspecified Date - Cystoscopy TURBT 2-5 cm - 16109

## 2023-05-27 NOTE — Anesthesia Preprocedure Evaluation (Addendum)
Anesthesia Evaluation  Patient identified by MRN, date of birth, ID band Patient awake    Reviewed: Allergy & Precautions, H&P , NPO status , Patient's Chart, lab work & pertinent test results  Airway Mallampati: III  TM Distance: >3 FB Neck ROM: Full    Dental no notable dental hx. (+) Teeth Intact, Dental Advisory Given   Pulmonary sleep apnea , former smoker   Pulmonary exam normal breath sounds clear to auscultation       Cardiovascular hypertension, Pt. on medications + CAD, + Past MI and + CABG   Rhythm:Regular Rate:Normal     Neuro/Psych TIA negative psych ROS   GI/Hepatic negative GI ROS, Neg liver ROS,,,  Endo/Other  diabetes, Type 2, Oral Hypoglycemic Agents    Renal/GU negative Renal ROS  negative genitourinary   Musculoskeletal  (+) Arthritis , Osteoarthritis,    Abdominal   Peds  Hematology  (+) Blood dyscrasia, anemia   Anesthesia Other Findings   Reproductive/Obstetrics negative OB ROS                             Anesthesia Physical Anesthesia Plan  ASA: 3  Anesthesia Plan: General   Post-op Pain Management: Tylenol PO (pre-op)*   Induction: Intravenous  PONV Risk Score and Plan: 3 and Ondansetron, Dexamethasone and Treatment may vary due to age or medical condition  Airway Management Planned: Oral ETT  Additional Equipment:   Intra-op Plan:   Post-operative Plan: Extubation in OR  Informed Consent: I have reviewed the patients History and Physical, chart, labs and discussed the procedure including the risks, benefits and alternatives for the proposed anesthesia with the patient or authorized representative who has indicated his/her understanding and acceptance.     Dental advisory given  Plan Discussed with: CRNA  Anesthesia Plan Comments: (See PAT note 05/24/23)       Anesthesia Quick Evaluation

## 2023-05-28 ENCOUNTER — Ambulatory Visit (HOSPITAL_COMMUNITY)
Admission: RE | Admit: 2023-05-28 | Discharge: 2023-05-28 | Disposition: A | Discharge status: Home / Self Care | Payer: Medicare HMO | Attending: Urology | Admitting: Urology

## 2023-05-28 ENCOUNTER — Ambulatory Visit (HOSPITAL_COMMUNITY): Payer: Self-pay | Admitting: Certified Registered"

## 2023-05-28 ENCOUNTER — Encounter (HOSPITAL_COMMUNITY)
Admission: RE | Disposition: A | Discharge status: Home / Self Care | Payer: Self-pay | Source: Home / Self Care | Attending: Urology

## 2023-05-28 ENCOUNTER — Other Ambulatory Visit: Payer: Self-pay | Admitting: *Deleted

## 2023-05-28 ENCOUNTER — Encounter (HOSPITAL_COMMUNITY): Payer: Self-pay | Admitting: Urology

## 2023-05-28 ENCOUNTER — Ambulatory Visit (HOSPITAL_BASED_OUTPATIENT_CLINIC_OR_DEPARTMENT_OTHER): Payer: Self-pay | Admitting: Certified Registered"

## 2023-05-28 DIAGNOSIS — C61 Malignant neoplasm of prostate: Secondary | ICD-10-CM | POA: Insufficient documentation

## 2023-05-28 DIAGNOSIS — I771 Stricture of artery: Secondary | ICD-10-CM

## 2023-05-28 DIAGNOSIS — I2510 Atherosclerotic heart disease of native coronary artery without angina pectoris: Secondary | ICD-10-CM | POA: Diagnosis not present

## 2023-05-28 DIAGNOSIS — Z87891 Personal history of nicotine dependence: Secondary | ICD-10-CM | POA: Diagnosis not present

## 2023-05-28 DIAGNOSIS — Z951 Presence of aortocoronary bypass graft: Secondary | ICD-10-CM | POA: Diagnosis not present

## 2023-05-28 DIAGNOSIS — G4730 Sleep apnea, unspecified: Secondary | ICD-10-CM | POA: Diagnosis not present

## 2023-05-28 DIAGNOSIS — N3030 Trigonitis without hematuria: Secondary | ICD-10-CM | POA: Diagnosis not present

## 2023-05-28 DIAGNOSIS — M1990 Unspecified osteoarthritis, unspecified site: Secondary | ICD-10-CM | POA: Diagnosis not present

## 2023-05-28 DIAGNOSIS — Z7984 Long term (current) use of oral hypoglycemic drugs: Secondary | ICD-10-CM | POA: Diagnosis not present

## 2023-05-28 DIAGNOSIS — N393 Stress incontinence (female) (male): Secondary | ICD-10-CM | POA: Diagnosis not present

## 2023-05-28 DIAGNOSIS — D649 Anemia, unspecified: Secondary | ICD-10-CM | POA: Insufficient documentation

## 2023-05-28 DIAGNOSIS — I10 Essential (primary) hypertension: Secondary | ICD-10-CM | POA: Diagnosis not present

## 2023-05-28 DIAGNOSIS — C679 Malignant neoplasm of bladder, unspecified: Secondary | ICD-10-CM | POA: Diagnosis not present

## 2023-05-28 DIAGNOSIS — E119 Type 2 diabetes mellitus without complications: Secondary | ICD-10-CM | POA: Diagnosis not present

## 2023-05-28 DIAGNOSIS — R3912 Poor urinary stream: Secondary | ICD-10-CM | POA: Insufficient documentation

## 2023-05-28 DIAGNOSIS — C678 Malignant neoplasm of overlapping sites of bladder: Secondary | ICD-10-CM | POA: Insufficient documentation

## 2023-05-28 DIAGNOSIS — Z923 Personal history of irradiation: Secondary | ICD-10-CM | POA: Insufficient documentation

## 2023-05-28 DIAGNOSIS — I252 Old myocardial infarction: Secondary | ICD-10-CM | POA: Diagnosis not present

## 2023-05-28 LAB — GLUCOSE, CAPILLARY: Glucose-Capillary: 168 mg/dL — ABNORMAL HIGH (ref 70–99)

## 2023-05-28 SURGERY — TURBT, WITH CHEMOTHERAPEUTIC AGENT INSTILLATION INTO BLADDER
Anesthesia: General

## 2023-05-28 MED ORDER — ONDANSETRON HCL 4 MG/2ML IJ SOLN
INTRAMUSCULAR | Status: AC
Start: 2023-05-28 — End: ?
  Filled 2023-05-28: qty 2

## 2023-05-28 MED ORDER — 0.9 % SODIUM CHLORIDE (POUR BTL) OPTIME
TOPICAL | Status: DC | PRN
  Administered 2023-05-28: 1000 mL

## 2023-05-28 MED ORDER — HYDROCODONE-ACETAMINOPHEN 5-325 MG PO TABS: 1.0000 | ORAL_TABLET | Freq: Four times a day (QID) | ORAL | 0 refills | Status: DC | PRN

## 2023-05-28 MED ORDER — SODIUM CHLORIDE 0.9% FLUSH: 3.0000 mL | INTRAVENOUS | Status: DC | PRN

## 2023-05-28 MED ORDER — LACTATED RINGERS IV SOLN: INTRAVENOUS | Status: DC

## 2023-05-28 MED ORDER — ACETAMINOPHEN 325 MG PO TABS: 650.0000 mg | ORAL_TABLET | ORAL | Status: DC | PRN

## 2023-05-28 MED ORDER — LIDOCAINE 2% (20 MG/ML) 5 ML SYRINGE
INTRAMUSCULAR | Status: DC | PRN
  Administered 2023-05-28: 40 mg via INTRAVENOUS

## 2023-05-28 MED ORDER — ACETAMINOPHEN 650 MG RE SUPP: 650.0000 mg | RECTAL | Status: DC | PRN

## 2023-05-28 MED ORDER — FENTANYL CITRATE (PF) 100 MCG/2ML IJ SOLN
INTRAMUSCULAR | Status: AC
  Filled 2023-05-28: qty 2

## 2023-05-28 MED ORDER — OXYCODONE HCL 5 MG PO TABS: 5.0000 mg | ORAL_TABLET | ORAL | Status: DC | PRN

## 2023-05-28 MED ORDER — SODIUM CHLORIDE 0.9 % IV SOLN: 250.0000 mL | INTRAVENOUS | Status: DC | PRN

## 2023-05-28 MED ORDER — PROPOFOL 10 MG/ML IV BOLUS
INTRAVENOUS | Status: AC
  Filled 2023-05-28: qty 20

## 2023-05-28 MED ORDER — ONDANSETRON HCL 4 MG/2ML IJ SOLN
INTRAMUSCULAR | Status: DC | PRN
  Administered 2023-05-28: 4 mg via INTRAVENOUS

## 2023-05-28 MED ORDER — ESMOLOL HCL 100 MG/10ML IV SOLN
INTRAVENOUS | Status: DC | PRN
  Administered 2023-05-28: 30 mg via INTRAVENOUS

## 2023-05-28 MED ORDER — FENTANYL CITRATE (PF) 100 MCG/2ML IJ SOLN
INTRAMUSCULAR | Status: DC | PRN
  Administered 2023-05-28 (×3): 50 ug via INTRAVENOUS

## 2023-05-28 MED ORDER — ORAL CARE MOUTH RINSE: 15.0000 mL | Freq: Once | OROMUCOSAL | Status: AC

## 2023-05-28 MED ORDER — MORPHINE SULFATE (PF) 2 MG/ML IV SOLN: 2.0000 mg | INTRAVENOUS | Status: DC | PRN

## 2023-05-28 MED ORDER — ROCURONIUM BROMIDE 10 MG/ML (PF) SYRINGE
PREFILLED_SYRINGE | INTRAVENOUS | Status: DC | PRN
  Administered 2023-05-28: 50 mg via INTRAVENOUS

## 2023-05-28 MED ORDER — CHLORHEXIDINE GLUCONATE 0.12 % MT SOLN
15.0000 mL | Freq: Once | OROMUCOSAL | Status: AC
  Administered 2023-05-28: 15 mL via OROMUCOSAL

## 2023-05-28 MED ORDER — PROPOFOL 10 MG/ML IV BOLUS
INTRAVENOUS | Status: DC | PRN
  Administered 2023-05-28: 20 mg via INTRAVENOUS
  Administered 2023-05-28: 40 mg via INTRAVENOUS
  Administered 2023-05-28: 140 mg via INTRAVENOUS

## 2023-05-28 MED ORDER — DEXAMETHASONE SODIUM PHOSPHATE 10 MG/ML IJ SOLN
INTRAMUSCULAR | Status: AC
  Filled 2023-05-28: qty 1

## 2023-05-28 MED ORDER — FENTANYL CITRATE PF 50 MCG/ML IJ SOSY: 25.0000 ug | PREFILLED_SYRINGE | INTRAMUSCULAR | Status: DC | PRN

## 2023-05-28 MED ORDER — SUGAMMADEX SODIUM 200 MG/2ML IV SOLN
INTRAVENOUS | Status: DC | PRN
  Administered 2023-05-28: 50 mg via INTRAVENOUS
  Administered 2023-05-28: 150 mg via INTRAVENOUS

## 2023-05-28 MED ORDER — SODIUM CHLORIDE 0.9% FLUSH: 3.0000 mL | Freq: Two times a day (BID) | INTRAVENOUS | Status: DC

## 2023-05-28 MED ORDER — DEXAMETHASONE SODIUM PHOSPHATE 10 MG/ML IJ SOLN
INTRAMUSCULAR | Status: DC | PRN
  Administered 2023-05-28: 4 mg via INTRAVENOUS

## 2023-05-28 MED ORDER — SODIUM CHLORIDE 0.9 % IR SOLN
Status: DC | PRN
  Administered 2023-05-28 (×3): 3000 mL via INTRAVESICAL

## 2023-05-28 MED ORDER — CEFAZOLIN SODIUM-DEXTROSE 2-4 GM/100ML-% IV SOLN
2.0000 g | INTRAVENOUS | Status: AC
  Administered 2023-05-28: 2 g via INTRAVENOUS
  Filled 2023-05-28: qty 100

## 2023-05-28 SURGICAL SUPPLY — 20 items
BAG URINE DRAIN 2000ML AR STRL (UROLOGICAL SUPPLIES) IMPLANT
BAG URO CATCHER STRL LF (MISCELLANEOUS) ×1 IMPLANT
CATH FOLEY 2WAY SLVR 5CC 18FR (CATHETERS) IMPLANT
CATH FOLEY 3WAY 30CC 22FR (CATHETERS) IMPLANT
CATH URETL OPEN 5X70 (CATHETERS) IMPLANT
DRAPE FOOT SWITCH (DRAPES) ×1 IMPLANT
ELECT REM PT RETURN 15FT ADLT (MISCELLANEOUS) ×1 IMPLANT
GLOVE SURG SS PI 8.0 STRL IVOR (GLOVE) ×1 IMPLANT
GOWN STRL REUS W/ TWL XL LVL3 (GOWN DISPOSABLE) ×1 IMPLANT
HOLDER FOLEY CATH W/STRAP (MISCELLANEOUS) IMPLANT
KIT TURNOVER KIT A (KITS) IMPLANT
LOOP CUT BIPOLAR 24F LRG (ELECTROSURGICAL) IMPLANT
MANIFOLD NEPTUNE II (INSTRUMENTS) ×1 IMPLANT
PACK CYSTO (CUSTOM PROCEDURE TRAY) ×1 IMPLANT
SUT ETHILON 3 0 PS 1 (SUTURE) IMPLANT
SYR 30ML LL (SYRINGE) IMPLANT
SYR TOOMEY IRRIG 70ML (MISCELLANEOUS) IMPLANT
SYRINGE TOOMEY IRRIG 70ML (MISCELLANEOUS) IMPLANT
TUBING CONNECTING 10 (TUBING) ×1 IMPLANT
TUBING UROLOGY SET (TUBING) ×1 IMPLANT

## 2023-05-28 NOTE — Transfer of Care (Signed)
Immediate Anesthesia Transfer of Care Note  Patient: Jason Bailey  Procedure(s) Performed: TRANSURETHRAL RESECTION OF BLADDER TUMOR (TURBT)  Patient Location: PACU  Anesthesia Type:General  Level of Consciousness: awake, alert , and patient cooperative  Airway & Oxygen Therapy: Patient Spontanous Breathing and Patient connected to face mask oxygen  Post-op Assessment: Report given to RN and Post -op Vital signs reviewed and stable  Post vital signs: Reviewed and stable  Last Vitals:  Vitals Value Taken Time  BP 117/63 05/28/23 1151  Temp    Pulse 62 05/28/23 1153  Resp 13 05/28/23 1153  SpO2 100 % 05/28/23 1153  Vitals shown include unfiled device data.  Last Pain:  Vitals:   05/28/23 0911  TempSrc:   PainSc: 0-No pain         Complications:  Encounter Notable Events  Notable Event Outcome Phase Comment  Difficult to intubate - unexpected  Intraprocedure Filed from anesthesia note documentation.

## 2023-05-28 NOTE — Interval H&P Note (Signed)
History and Physical Interval Note:  05/28/2023 10:38 AM  Jason Bailey  has presented today for surgery, with the diagnosis of BLADDER TUMOR.  The various methods of treatment have been discussed with the patient and family. After consideration of risks, benefits and other options for treatment, the patient has consented to  Procedure(s): TRANSURETHRAL RESECTION OF BLADDER TUMOR (TURBT) with GEMCITABINE (N/A) as a surgical intervention.  The patient's history has been reviewed, patient examined, no change in status, stable for surgery.  I have reviewed the patient's chart and labs.  Questions were answered to the patient's satisfaction.     Bjorn Pippin

## 2023-05-28 NOTE — Anesthesia Procedure Notes (Signed)
Procedure Name: Intubation Date/Time: 05/28/2023 10:56 AM  Performed by: Sindy Guadeloupe, CRNAPre-anesthesia Checklist: Patient identified, Emergency Drugs available, Suction available, Patient being monitored and Timeout performed Patient Re-evaluated:Patient Re-evaluated prior to induction Oxygen Delivery Method: Circle system utilized Preoxygenation: Pre-oxygenation with 100% oxygen Induction Type: IV induction Ventilation: Mask ventilation without difficulty Laryngoscope Size: Glidescope and 4 Grade View: Grade II Tube type: Oral Number of attempts: 2 Airway Equipment and Method: Stylet Placement Confirmation: ETT inserted through vocal cords under direct vision, positive ETCO2 and breath sounds checked- equal and bilateral Secured at: 23 cm Tube secured with: Tape Dental Injury: Teeth and Oropharynx as per pre-operative assessment  Difficulty Due To: Difficulty was unanticipated and Difficult Airway- due to anterior larynx Comments: IV induction, easy mask airway, DVL x 1 with view of epiglottis only, Glidescope x1 with partial view of vocal cords. Atraumatic intubation, +/= BBS confirmed.

## 2023-05-28 NOTE — Anesthesia Postprocedure Evaluation (Signed)
Anesthesia Post Note  Patient: Jason Bailey  Procedure(s) Performed: TRANSURETHRAL RESECTION OF BLADDER TUMOR (TURBT)     Patient location during evaluation: PACU Anesthesia Type: General Level of consciousness: awake and alert Pain management: pain level controlled Vital Signs Assessment: post-procedure vital signs reviewed and stable Respiratory status: spontaneous breathing, nonlabored ventilation and respiratory function stable Cardiovascular status: blood pressure returned to baseline and stable Postop Assessment: no apparent nausea or vomiting Anesthetic complications: yes  Encounter Notable Events  Notable Event Outcome Phase Comment  Difficult to intubate - unexpected  Intraprocedure Filed from anesthesia note documentation.    Last Vitals:  Vitals:   05/28/23 1230 05/28/23 1255  BP: (!) 148/60 (!) 157/63  Pulse: (!) 59 64  Resp: 13 14  Temp:  36.4 C  SpO2: 99% 98%    Last Pain:  Vitals:   05/28/23 1255  TempSrc:   PainSc: 0-No pain                 Laia Wiley,W. EDMOND

## 2023-05-28 NOTE — Op Note (Signed)
Procedure: 1.  Cystoscopy with transurethral resection of 3 x 5 cm bladder tumor of overlapping sites. 2.  Diagnostic right ureteroscopy.  Preop diagnosis: Bladder tumor of the right lateral wall.  Postop diagnosis: Bladder tumor of overlapping sites including the right bladder neck right trigone and right posterior lateral wall.  Surgeon: Dr. Bjorn Pippin.  Anesthesia: General.  Specimen: 1.  Resection chip of the right ureteral orifice. 2.  Bladder tumor chips.  Drains: 18 French Foley catheter.  EBL: 5 mL.  Complications: None.  Indications: Jason Bailey is a 72 year old male with a history of prostate cancer treated with radiation and bladder cancer that been treated with local measures.  He was recently found on surveillance cystoscopy to have papillary recurrence on the right lateral wall with some associated mucosal changes adjacent.  He is to undergo cystoscopy with transurethral resection of the the bladder tumor today and possible gemcitabine instillation.  Procedure: He was taken the operating room where he was given Ancef.  A general anesthetic was induced.  He was placed in lithotomy position and fitted with PAS hose.  His perineum and genitalia were prepped with Betadine solution and draped in usual sterile fashion.  Cystoscopy was performed using the 21 [place] scope and 30 degree lens but urethral dilation to 103 [place] was required to pass the scope.  The urethra was otherwise normal.  The external sphincter was intact.  The prostatic urethra short without obstruction and with mucosal blanching consistent with his prior prostate cancer.  Examination of the bladder demonstrated mild trabeculation.  On the right posterior lateral wall there was some velvety mucosa suggestive of carcinoma in situ and at the junction of the lateral right trigone and lateral wall there was a small papillary component approximately 1 cm in size.  Between the right ureteral orifice and the bladder neck there  was some additional velvety mucosa concerning for carcinoma in situ and there was some papillary strands coming out of the ureteral meatus.  The left ureteral orifice was unremarkable.  The 4.5 French single-lumen semirigid ureteroscope was then passed and I was able to successfully cannulate the right ureteral meatus.  Inspection demonstrated very limited abnormal mucosa right at the meatus with normal mucosa approximately 2 to 3 mm farther on and proximally.  The resectoscope was then inserted with the aid of the visual obturator and fitted with an Wandra Scot handle with a bipolar loop and 30 degree lens.  Saline was used as the irrigant.  The ureteral orifice was then resected on cut to remove the concerning tissue and was collected to be sent as a separate specimen.  Very minimal coagulation was performed on the very periphery of the resection margins avoiding the orifice itself which appeared widely patent.  I then resected and fulgurated a 3 x 5 cm patch including the papillary tumor and the associated abnormal mucosa.  This was extended across the lateral trigone to include the abnormal tissue at the right bladder neck and then also medial to the ureteral orifice.  Remaining concerning mucosa was then generously fulgurated.  The tumor chips were then evacuated from the bladder and final hemostasis was achieved.  Inspection revealed muscular fibers in the bed of the resection with some visible fat but no perforation but with the need to resect the orifice I felt gemcitabine was not prudent.  The resectoscope was removed and an 44 French Foley catheter was inserted.  The balloon was filled with 10 mL of sterile fluid and the catheter  was irrigated with an Ase[place] syringe with clear return.  The cath was placed to straight drainage.  He was taken down from lithotomy position, his anesthetic was reversed and he was moved recovery in stable condition.  There were no complications.

## 2023-05-28 NOTE — Discharge Instructions (Addendum)
You may remove the catheter at home in the morning.  Please hold the aspirin if you have held it for another 72hrs.  No vigorous physical activity for a week.

## 2023-05-29 LAB — SURGICAL PATHOLOGY

## 2023-06-10 NOTE — Progress Notes (Signed)
 Patient name: Jason Bailey MRN: [id number] DOB: [birth year] Sex: male  REASON FOR CONSULT: Increase in subclavian steal symptoms, dizziness  HPI: Jason Bailey is a 72 y.o. male, with history CAD, diabetes, hypertension, hyperlipidemia that presents for further evaluation of reported worsening subclavian steal symptoms.  Patient states he is actually here just to establish care since Dr. Eliza retired.  He states that his dizziness is no worse.  States he gets 1 or 2 episodes of dizziness per week that usually lasts a second.  They do not interfere with his daily activities.  Knows to take his blood pressure in the left arm.  Previously seen by Dr. 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  He was offered a potential vertebral artery to common carotid artery transposition if he had worsening symptoms due to significant right subclavian stenosis and significant right vertebral artery stenosis.  His left vertebral artery is occluded and Dr. Eliza suggested he should check his blood pressure in the left arm.  Past Medical History:  Diagnosis Date   Cancer [facility]    prostate   Chest pain    Coronary artery disease    Diabetes mellitus    Heart attack (HCC)    approx. 2009   Heart murmur    History of kidney stones    Hyperlipidemia    Hypertension    Malaise and fatigue    Osteoarthritis    Right foot drop    Right shoulder pain    Sleep apnea    Vision abnormalities     Past Surgical History:  Procedure Laterality Date   ANTERIOR CERVICAL DECOMP/DISCECTOMY FUSION N/A 03/26/2019   Procedure: ANTERIOR CERVICAL DECOMPRESSION/DISCECTOMY FUSION ONE LEVEL, CERVICAL FIVE - CERVICAL SIX;  Surgeon: Alix Charleston, MD;  Location: [facility] OR;  Service: Neurosurgery;  Laterality: N/A;  ANTERIOR CERVICAL DECOMPRESSION/DISCECTOMY FUSION ONE LEVEL, CERVICAL FIVE - CERVICAL SIX    CAD     disease status post CABG x 4 on 02/14/2007    COLONOSCOPY WITH PROPOFOL  N/A 06/22/2022   Procedure: COLONOSCOPY WITH PROPOFOL ;  Surgeon: Saintclair Jasper, MD;  Location: [facility] ENDOSCOPY;  Service: Gastroenterology;  Laterality: N/A;   KNEE SURGERY     POLYPECTOMY  06/22/2022   Procedure: POLYPECTOMY;  Surgeon: Saintclair Jasper, MD;  Location: [facility] ENDOSCOPY;  Service: Gastroenterology;;    Family History  Problem Relation Age of Onset   Breast cancer Mother    Liver cancer Father    Renal cancer Sister    Diabetes Maternal Grandfather     SOCIAL HISTORY: Social History   Socioeconomic History   Marital status: Married    Spouse name: Not on file   Number of children: Not on file   Years of education: Not on file   Highest education level: Not on file  Occupational History   Not on file  Tobacco Use   Smoking status: Former   Smokeless tobacco: Never   Tobacco comments:    quit 4 yrs ago  Vaping Use   Vaping status: Never Used  Substance and Sexual Activity   Alcohol use: No   Drug use: No   Sexual activity: Not on file  Other Topics Concern   Not on file  Social History Narrative   Not on file   Social Drivers of Health   Financial Resource Strain: Not on file  Food Insecurity: No Food Insecurity (06/20/2022)   Hunger Vital Sign    Worried About  Running Out of Food in the Last Year: Never true    Ran Out of Food in the Last Year: Never true  Transportation Needs: No Transportation Needs (06/20/2022)   PRAPARE - Administrator, Civil Service (Medical): No    Lack of Transportation (Non-Medical): No  Physical Activity: Not on file  Stress: Not on file  Social Connections: Not on file  Intimate Partner Violence: Not At Risk (06/20/2022)   Humiliation, Afraid, Rape, and Kick questionnaire    Fear of Current or Ex-Partner: No    Emotionally Abused: No    Physically Abused: No    Sexually Abused: No    No Known Allergies  Current Outpatient Medications  Medication Sig Dispense Refill   aspirin  EC 81 MG tablet  Take 1 tablet (81 mg total) by mouth daily.     empagliflozin  (JARDIANCE ) 25 MG TABS tablet Take 12.5 mg by mouth in the morning.     glimepiride (AMARYL) 4 MG tablet Take 4 mg by mouth at bedtime.     HYDROcodone -acetaminophen  (NORCO/VICODIN) 5-325 MG tablet Take 1 tablet by mouth every 6 (six) hours as needed. 8 tablet 0   lisinopril  (ZESTRIL ) 10 MG tablet Take 10 mg by mouth at bedtime. 10 MG + 20 MG=30 MG     lisinopril  (ZESTRIL ) 20 MG tablet Take 20 mg by mouth at bedtime. 20 MG + 10 MG=30 MG     metFORMIN  (GLUCOPHAGE -XR) 500 MG 24 hr tablet Take 2 tablets (1,000 mg total) by mouth 2 (two) times daily. 120 tablet 1   montelukast  (SINGULAIR ) 10 MG tablet Take 10 mg by mouth in the morning.     nitroGLYCERIN  (NITROSTAT ) 0.4 MG SL tablet Place 1 tablet (0.4 mg total) under the tongue every 5 (five) minutes as needed for chest pain. 25 tablet 3   pravastatin  (PRAVACHOL ) 40 MG tablet Take 1 tablet (40 mg total) by mouth daily. (Patient taking differently: Take 40 mg by mouth at bedtime.) 30 tablet 1   vitamin B-12 (CYANOCOBALAMIN ) 500 MCG tablet Take 500 mcg by mouth in the morning.     No current facility-administered medications for this visit.    REVIEW OF SYSTEMS:  [X]  denotes positive finding, [ ]  denotes negative finding Cardiac  Comments:  Chest pain or chest pressure:    Shortness of breath upon exertion:    Short of breath when lying flat:    Irregular heart rhythm:        Vascular    Pain in calf, thigh, or hip brought on by ambulation:    Pain in feet at night that wakes you up from your sleep:     Blood clot in your veins:    Leg swelling:         Pulmonary    Oxygen at home:    Productive cough:     Wheezing:         Neurologic    Sudden weakness in arms or legs:     Sudden numbness in arms or legs:     Sudden onset of difficulty speaking or slurred speech:    Temporary loss of vision in one eye:     Problems with dizziness:         Gastrointestinal    Blood in  stool:     Vomited blood:         Genitourinary    Burning when urinating:     Blood in urine:  Psychiatric    Major depression:         Hematologic    Bleeding problems:    Problems with blood clotting too easily:        Skin    Rashes or ulcers:        Constitutional    Fever or chills:      PHYSICAL EXAM: There were no vitals filed for this visit.  GENERAL: The patient is a well-nourished male, in no acute distress. The vital signs are documented above. CARDIAC: There is a regular rate and rhythm.  VASCULAR:  Left radial pulse palpable Right radial pulse weak PULMONARY: No respiratory distress. ABDOMEN: Soft and non-tender. MUSCULOSKELETAL: There are no major deformities or cyanosis. NEUROLOGIC: No focal weakness or paresthesias are detected.  Cranial nerves II through XII grossly intact. SKIN: There are no ulcers or rashes noted. PSYCHIATRIC: The patient has a normal affect.  DATA:   CTA neck 09/26/22  MPRESSION: 1. Positive for Severely abnormal BILATERAL proximal Subclavian And Vertebral arteries: - Near Occlusion, RADIOGRAPHIC STRING SIGN STENOSIS right subclavian artery origin. - Severe stenosis throughout the proximal right vertebral V1 segment due to bulky calcified plaque. - short segment High-grade (71%) stenosis of the Left Subclavian Artery, which continues to and OCCLUDES the Left Vertebral Artery Origin. - reconstituted Left Vertebral Artery in the V2 segment.   2. Superimposed distal left vertebral artery up to moderate atherosclerotic plaque and stenosis. But left PICA and vertebrobasilar junction remains patent.   3. Widespread bilateral Carotid Artery atherosclerosis, although no hemodynamically significant carotid stenosis in the neck. Partially visible at least moderate bilateral cavernous ICA calcified plaque.   4.  Aortic Atherosclerosis (ICD10-I70.0).  Previous CABG.   Electronically Signed: By: VEAR Hurst M.D. On: 09/30/2022  07:47  Assessment/Plan:  72 y.o. male, with history CAD, diabetes, hypertension, hyperlipidemia that presents for further evaluation of reported worsening subclavian steal symptoms.  Patient reports he is actually here to just establish care.  He feels his dizziness is at baseline since he saw Dr. Eliza earlier this year on 10/04/2022.  He reports 1 or 2 brief episodes a week that does not usually interfere with his activities.  This usually lasts less than a second.  Dr. Eliza previously discussed if he had worsening symptoms would need right vertebral artery to common carotid transposition.  Fortunately I do not think he needs any intervention at this time as he has no worsening symptoms.  He understands any surgical intervention would be risky with associated stroke.  I do have some concerns about transposing his right vertebral artery given the amount of calcification and disease in the proximal portion of the artery but we can reevaluate that in the future if and when he does need a surgical intervention.  Carotid duplex otherwise shows minimal 1 to 39% stenosis bilaterally.  Will repeat in 1 year with follow-up.   Lonni DOROTHA Gaskins, MD [facility] Office: [phone]

## 2023-06-11 ENCOUNTER — Ambulatory Visit (HOSPITAL_COMMUNITY)
Admission: RE | Admit: 2023-06-11 | Discharge: 2023-06-11 | Disposition: A | Discharge status: Home / Self Care | Payer: Medicare HMO | Source: Ambulatory Visit | Attending: Vascular Surgery | Admitting: Vascular Surgery

## 2023-06-11 ENCOUNTER — Encounter: Payer: Self-pay | Admitting: Vascular Surgery

## 2023-06-11 ENCOUNTER — Ambulatory Visit: Payer: Medicare HMO | Admitting: Vascular Surgery

## 2023-06-11 VITALS — BP 135/62 | HR 64 | Temp 97.9°F | Resp 18 | Ht 67.0 in | Wt 156.0 lb

## 2023-06-11 DIAGNOSIS — I771 Stricture of artery: Secondary | ICD-10-CM | POA: Diagnosis present

## 2023-06-11 DIAGNOSIS — R42 Dizziness and giddiness: Secondary | ICD-10-CM | POA: Insufficient documentation

## 2023-06-17 ENCOUNTER — Telehealth: Payer: Self-pay

## 2023-06-17 NOTE — Telephone Encounter (Signed)
 Patient spouse calling to check status on referral that was sent on 06/11/23. Would like to get patient schedule asap

## 2023-06-25 ENCOUNTER — Other Ambulatory Visit: Payer: Self-pay

## 2023-06-25 DIAGNOSIS — I771 Stricture of artery: Secondary | ICD-10-CM

## 2023-08-15 ENCOUNTER — Ambulatory Visit: Payer: Medicare Other | Admitting: Endocrinology

## 2023-08-15 ENCOUNTER — Encounter: Payer: Self-pay | Admitting: Endocrinology

## 2023-08-15 VITALS — BP 122/66 | HR 85 | Ht 67.0 in | Wt 159.0 lb

## 2023-08-15 DIAGNOSIS — E1169 Type 2 diabetes mellitus with other specified complication: Secondary | ICD-10-CM

## 2023-08-15 DIAGNOSIS — E1165 Type 2 diabetes mellitus with hyperglycemia: Secondary | ICD-10-CM

## 2023-08-15 DIAGNOSIS — Z7984 Long term (current) use of oral hypoglycemic drugs: Secondary | ICD-10-CM

## 2023-08-15 MED ORDER — METFORMIN HCL ER 500 MG PO TB24: 1000.0000 mg | ORAL_TABLET | Freq: Two times a day (BID) | ORAL | 3 refills | Status: AC

## 2023-08-15 MED ORDER — EMPAGLIFLOZIN 25 MG PO TABS: 25.0000 mg | ORAL_TABLET | Freq: Every morning | ORAL | 3 refills | Status: AC

## 2023-08-15 NOTE — Progress Notes (Signed)
 Outpatient Endocrinology Note [place] Ediel Unangst, MD   Patient's Name: Jason Bailey    DOB: [birth year]    MRN: [id number]                                                    REASON OF VISIT: New consult for type 2 diabetes mellitus  REFERRING PROVIDER: Bjorn Pippin, MD  PCP: Tacey Ruiz, FNP  HISTORY OF PRESENT ILLNESS:   Jason Bailey is a 73 y.o. old male with past medical history listed below, is here for new consult for type 2 diabetes mellitus.   Pertinent Diabetes History: Patient was previously seen in this clinic by Dr. Everardo All was last time seen in August 2020, after that patient was following with local endocrinologist, who moved, and referred to this clinic for establishing type 2 diabetes care.  He was diagnosed with type 2 diabetes mellitus in 1994. Patient has uncontrolled diabetes mellitus type 2, hemoglobin A1c mostly in the range of 7 to 7.8% range.  Patient was diagnosed with bladder cancer in 2024, following with oncology/urology.  History of malignant neoplasm of prostate 2017, prostate cancer 2014.  Chronic Diabetes Complications : Retinopathy: no. Last ophthalmology exam was done on annually reportedly, following with ophthalmology regularly.  Nephropathy: no, on ACE/ARB /Lisinopril Peripheral neuropathy: yes Coronary artery disease: yes s/p CABG in 2008 Stroke: yes TIA  Relevant comorbidities and cardiovascular risk factors: Obesity: no Body mass index is 24.9 kg/m.  Hypertension: Yes  Hyperlipidemia : Yes, on statin   Current / Home Diabetic regimen includes:  Metformin extended release 1000 mg 2 times a day. Jardiance 25 mg daily. Glimepiride ?  2 or 4 mg daily.  Not sure about the dose today.  Prior diabetic medications: Januvia in the past. Repaglinide in the past.  Glycemic data He has glucometer and checking blood sugar occasionally at home, reports this morning blood sugar at 137.  He reports mostly blood sugar in the low 100-160 range.     Hypoglycemia: Patient has no hypoglycemic episodes. Patient has hypoglycemia awareness.  Factors modifying glucose control: 1.  Diabetic diet assessment: 2 meals a day, he snacks in between and drinks regular soda sometimes.  2.  Staying active or exercising:   3.  Medication compliance: compliant all of the time.  # Hypogonadism on testosterone therapy, currently on hold due to bladder cancer, patient has history of prostate cancer, testosterone therapy is managed by urology.  Interval history  Patient presented to establish diabetes care.  Diabetes regimen as reviewed above.  He is not sure about the dose of glimepiride.  Hemoglobin A1c in December was 7.4%.  Denies numbness and ting of the feet.  No vision problem.  No other complaints today.  REVIEW OF SYSTEMS As per history of present illness.   PAST MEDICAL HISTORY: Past Medical History:  Diagnosis Date   Cancer [facility])    prostate   Chest pain    Coronary artery disease    Diabetes mellitus    Heart attack (HCC)    approx. 2009   Heart murmur    History of kidney stones    Hyperlipidemia    Hypertension    Malaise and fatigue    Osteoarthritis    Right foot drop    Right shoulder pain    Sleep apnea  Vision abnormalities     PAST SURGICAL HISTORY: Past Surgical History:  Procedure Laterality Date   ANTERIOR CERVICAL DECOMP/DISCECTOMY FUSION N/A 03/26/2019   Procedure: ANTERIOR CERVICAL DECOMPRESSION/DISCECTOMY FUSION ONE LEVEL, CERVICAL FIVE - CERVICAL SIX;  Surgeon: Shirlean Kelly, MD;  Location: [facility] OR;  Service: Neurosurgery;  Laterality: N/A;  ANTERIOR CERVICAL DECOMPRESSION/DISCECTOMY FUSION ONE LEVEL, CERVICAL FIVE - CERVICAL SIX    CAD     disease status post CABG x 4 on 02/14/2007   COLONOSCOPY WITH PROPOFOL N/A 06/22/2022   Procedure: COLONOSCOPY WITH PROPOFOL;  Surgeon: Kerin Salen, MD;  Location: [facility];  Service: Gastroenterology;  Laterality: N/A;   KNEE SURGERY     POLYPECTOMY   06/22/2022   Procedure: POLYPECTOMY;  Surgeon: Kerin Salen, MD;  Location: [facility];  Service: Gastroenterology;;    ALLERGIES: No Known Allergies  FAMILY HISTORY:  Family History  Problem Relation Age of Onset   Breast cancer Mother    Liver cancer Father    Renal cancer Sister    Diabetes Maternal Grandfather     SOCIAL HISTORY: Social History   Socioeconomic History   Marital status: Married    Spouse name: Not on file   Number of children: Not on file   Years of education: Not on file   Highest education level: Not on file  Occupational History   Not on file  Tobacco Use   Smoking status: Former   Smokeless tobacco: Never   Tobacco comments:    quit 4 yrs ago  Vaping Use   Vaping status: Never Used  Substance and Sexual Activity   Alcohol use: No   Drug use: No   Sexual activity: Not on file  Other Topics Concern   Not on file  Social History Narrative   Not on file   Social Drivers of Health   Financial Resource Strain: Not on file  Food Insecurity: No Food Insecurity (07/04/2023)   Received from [facility]   Hunger Vital Sign    Worried About Running Out of Food in the Last Year: Never true    Ran Out of Food in the Last Year: Never true  Transportation Needs: No Transportation Needs (07/04/2023)   Received from [facility]   PRAPARE - Transportation    Lack of Transportation (Medical): No    Lack of Transportation (Non-Medical): No  Physical Activity: Not on file  Stress: Not on file  Social Connections: Not on file    MEDICATIONS:  Current Outpatient Medications  Medication Sig Dispense Refill   aspirin EC 81 MG tablet Take 1 tablet (81 mg total) by mouth daily.     cholecalciferol (VITAMIN D3) 25 MCG (1000 UNIT) tablet Take 1,000 Units by mouth daily.     lisinopril (ZESTRIL) 20 MG tablet Take 20 mg by mouth at bedtime.     nitroGLYCERIN (NITROSTAT) 0.4 MG SL tablet Place 1 tablet (0.4 mg total) under the tongue every 5 (five)  minutes as needed for chest pain. 25 tablet 3   pravastatin (PRAVACHOL) 40 MG tablet Take 1 tablet (40 mg total) by mouth daily. (Patient taking differently: Take 40 mg by mouth at bedtime.) 30 tablet 1   vitamin B-12 (CYANOCOBALAMIN) 500 MCG tablet Take 500 mcg by mouth in the morning.     empagliflozin (JARDIANCE) 25 MG TABS tablet Take 1 tablet (25 mg total) by mouth in the morning. 90 tablet 3   glimepiride (AMARYL) 4 MG tablet Take 4 mg by mouth at bedtime. (  Patient not taking: Reported on 08/15/2023)     HYDROcodone-acetaminophen (NORCO/VICODIN) 5-325 MG tablet Take 1 tablet by mouth every 6 (six) hours as needed. (Patient not taking: Reported on 08/15/2023) 8 tablet 0   lisinopril (ZESTRIL) 10 MG tablet Take 10 mg by mouth at bedtime. 10 MG + 20 MG=30 MG (Patient not taking: Reported on 08/15/2023)     metFORMIN (GLUCOPHAGE-XR) 500 MG 24 hr tablet Take 2 tablets (1,000 mg total) by mouth 2 (two) times daily. 360 tablet 3   montelukast (SINGULAIR) 10 MG tablet Take 10 mg by mouth in the morning. (Patient not taking: Reported on 08/15/2023)     Testosterone Cypionate 200 MG/ML KIT Inject into the muscle. (Patient not taking: Reported on 08/15/2023)     No current facility-administered medications for this visit.    PHYSICAL EXAM: Vitals:   08/15/23 0838  BP: 122/66  Pulse: 85  SpO2: 98%  Weight: 159 lb (72.1 kg)  Height: 5\' 7"  (1.702 m)   Body mass index is 24.9 kg/m.  Wt Readings from Last 3 Encounters:  08/15/23 159 lb (72.1 kg)  06/11/23 156 lb (70.8 kg)  05/28/23 160 lb (72.6 kg)    General: Well developed, well nourished male in no apparent distress.  HEENT: AT/[place], no external lesions.  Eyes: Conjunctiva clear and no icterus. Neck: Neck supple  Lungs: Respirations not labored Neurologic: Alert, oriented, normal speech Extremities / Skin: Dry. No sores or rashes noted. Psychiatric: Does not appear depressed or anxious  Diabetic Foot Exam - Simple   No data filed     LABS  Reviewed Lab Results  Component Value Date   HGBA1C 7.4 (H) 05/24/2023   HGBA1C 7.7 (H) 06/21/2022   HGBA1C 7.8 (A) 01/27/2019   No results found for: "FRUCTOSAMINE" Lab Results  Component Value Date   CHOL 156 06/14/2017   HDL 26 (L) 06/14/2017   LDLCALC 89 06/14/2017   TRIG 206 (H) 06/14/2017   CHOLHDL 6.0 06/14/2017   No results found for: "MICRALBCREAT" Lab Results  Component Value Date   CREATININE 0.66 05/24/2023   No results found for: "GFR"  ASSESSMENT / PLAN  1. Uncontrolled type 2 diabetes mellitus with hyperglycemia (HCC)   2. Type 2 diabetes mellitus with other specified complication, without long-term current use of insulin (HCC)     Diabetes Mellitus type 2, complicated by CAD - Diabetic status / severity: Uncontrolled.  Lab Results  Component Value Date   HGBA1C 7.4 (H) 05/24/2023    - Hemoglobin A1c goal : <6.5-7%  Patient has mildly uncontrolled diabetes mellitus.  Discussed in detail about regulation of diet with avoiding frequently snacks and stopping regular soda drinks.  Advised to increase physical activity and exercise.  - Medications: No change in medications.  I) continue metformin extended release 1000 mg 2 times a day. II) continue Jardiance 25 mg daily. III) continue glimepiride on the current dose 2 or 4 mg daily.  Patient is not sure about the dose he is currently taking at home, patient is asked to send message or call our clinic to verify after he is able to check at home.  - Home glucose testing: In the morning fasting and occasionally at bedtime.  Asked to bring glucometer in the follow-up visit.  He reports he has glucometer and test supplies. - Discussed/ Gave Hypoglycemia treatment plan.  # Consult : not required at this time.   # Annual urine for microalbuminuria/ creatinine ratio, no microalbuminuria currently, continue ACE/ARB /lisinopril.  He had normal urine protein and creatinine ratio in July 2024. Last No results found  for: "MICRALBCREAT"  # Foot check nightly.  # Annual dilated diabetic eye exams.   - Diet: Make healthy diabetic food choices - Life style / activity / exercise: Discussed.  2. Blood pressure  -  BP Readings from Last 1 Encounters:  08/15/23 122/66    - Control is in target.  - No change in current plans.  3. Lipid status / Hyperlipidemia - Last  Lab Results  Component Value Date   LDLCALC 89 06/14/2017   - Continue pravastatin 40 mg daily.  Managed by primary care provider.  Rollyn was seen today for establish care.  Diagnoses and all orders for this visit:  Uncontrolled type 2 diabetes mellitus with hyperglycemia (HCC)  Type 2 diabetes mellitus with other specified complication, without long-term current use of insulin (HCC) -     metFORMIN (GLUCOPHAGE-XR) 500 MG 24 hr tablet; Take 2 tablets (1,000 mg total) by mouth 2 (two) times daily. -     empagliflozin (JARDIANCE) 25 MG TABS tablet; Take 1 tablet (25 mg total) by mouth in the morning.    DISPOSITION Follow up in clinic in 6 weeks suggested.   All questions answered and patient verbalized understanding of the plan.  [place] Crixus Mcaulay, MD [facility] Endocrinology [facility] [address] [place], [address] [place], [place] [address] Phone # [phone]  At least part of this note was generated using voice recognition software. Inadvertent word errors may have occurred, which were not recognized during the proofreading process.

## 2023-09-23 ENCOUNTER — Encounter: Payer: Self-pay | Admitting: Endocrinology

## 2023-09-23 ENCOUNTER — Ambulatory Visit: Admitting: Endocrinology

## 2023-09-23 VITALS — BP 108/50 | HR 71 | Resp 20 | Ht 67.0 in | Wt 160.4 lb

## 2023-09-23 DIAGNOSIS — E1169 Type 2 diabetes mellitus with other specified complication: Secondary | ICD-10-CM

## 2023-09-23 DIAGNOSIS — Z7984 Long term (current) use of oral hypoglycemic drugs: Secondary | ICD-10-CM | POA: Diagnosis not present

## 2023-09-23 LAB — POCT GLYCOSYLATED HEMOGLOBIN (HGB A1C): Hemoglobin A1C: 7.4 % — AB (ref 4.0–5.6)

## 2023-09-23 NOTE — Progress Notes (Signed)
 Outpatient Endocrinology Note Jason Ezri Landers, MD   Patient's Name: Jason Bailey    DOB: February 09, 1951    MRN: [id number]                                                    REASON OF VISIT: Follow-up for type 2 diabetes mellitus  REFERRING PROVIDER: Bjorn Pippin, MD  PCP: Tacey Ruiz, FNP  HISTORY OF PRESENT ILLNESS:   Jason Bailey is a 73 y.o. old male with past medical history listed below, is here for follow-up for type 2 diabetes mellitus.   Pertinent Diabetes History: Patient was previously seen in this clinic by Dr. Everardo All was last time seen in August 2020, after that patient was following with local endocrinologist, who moved, and referred to this clinic for establishing type 2 diabetes care, was initially seen in March 2025.  He was diagnosed with type 2 diabetes mellitus in 1994. Patient has uncontrolled diabetes mellitus type 2, hemoglobin A1c mostly in the range of 7 to 7.8% range.  Patient was diagnosed with bladder cancer in 2024, following with oncology/urology.  History of malignant neoplasm of prostate 2017, prostate cancer 2014.  Chronic Diabetes Complications : Retinopathy: no. Last ophthalmology exam was done on annually reportedly, following with ophthalmology regularly.  Nephropathy: no, on ACE/ARB /Lisinopril Peripheral neuropathy: yes Coronary artery disease: yes s/p CABG in 2008 Stroke: yes TIA  Relevant comorbidities and cardiovascular risk factors: Obesity: no Body mass index is 25.12 kg/m.  Hypertension: Yes  Hyperlipidemia : Yes, on statin   Current / Home Diabetic regimen includes:  Metformin extended release 1000 mg 2 times a day. Jardiance 25 mg daily. Glimepiride 4 mg daily.  Not sure about the dose today.  Prior diabetic medications: Januvia in the past. Repaglinide in the past.  Glycemic data Not able to download glucometer in the clinic today.  Some of the blood sugars are as follows.  Mostly acceptable fasting blood sugar and  hyperglycemia up to 293 range however he reports these numbers are within 2 hours of eating.   140,204,160, 273, 151, 234, 190, 123, 217, 293,  280, 269.   Hypoglycemia: Patient has no hypoglycemic episodes. Patient has hypoglycemia awareness.  Factors modifying glucose control: 1.  Diabetic diet assessment: 2 meals a day, he snacks in between and drinks regular soda sometimes.  2.  Staying active or exercising:   3.  Medication compliance: compliant all of the time.  # Hypogonadism on testosterone therapy, currently on hold due to bladder cancer, patient has history of prostate cancer, testosterone therapy is managed by urology.  Interval history  We are committed to test report above.  Postprandial hyperglycemia.  Acceptable blood sugar in the morning fasting.  Hemoglobin A1c 7.4%.  He reports he completed chemotherapy for prostate cancer.  No other complaints today.  REVIEW OF SYSTEMS As per history of present illness.   PAST MEDICAL HISTORY: Past Medical History:  Diagnosis Date   Cancer [facility])    prostate   Chest pain    Coronary artery disease    Diabetes mellitus    Heart attack (HCC)    approx. 2009   Heart murmur    History of kidney stones    Hyperlipidemia    Hypertension    Malaise and fatigue    Osteoarthritis    Right foot  drop    Right shoulder pain    Sleep apnea    Vision abnormalities     PAST SURGICAL HISTORY: Past Surgical History:  Procedure Laterality Date   ANTERIOR CERVICAL DECOMP/DISCECTOMY FUSION N/A 03/26/2019   Procedure: ANTERIOR CERVICAL DECOMPRESSION/DISCECTOMY FUSION ONE LEVEL, CERVICAL FIVE - CERVICAL SIX;  Surgeon: Yvonna Herder, MD;  Location: [facility] OR;  Service: Neurosurgery;  Laterality: N/A;  ANTERIOR CERVICAL DECOMPRESSION/DISCECTOMY FUSION ONE LEVEL, CERVICAL FIVE - CERVICAL SIX    CAD     disease status post CABG x 4 on 02/14/2007   COLONOSCOPY WITH PROPOFOL N/A 06/22/2022   Procedure: COLONOSCOPY WITH PROPOFOL;  Surgeon:  Genell Ken, MD;  Location: [facility];  Service: Gastroenterology;  Laterality: N/A;   KNEE SURGERY     POLYPECTOMY  06/22/2022   Procedure: POLYPECTOMY;  Surgeon: Genell Ken, MD;  Location: [facility];  Service: Gastroenterology;;    ALLERGIES: No Known Allergies  FAMILY HISTORY:  Family History  Problem Relation Age of Onset   Breast cancer Mother    Liver cancer Father    Renal cancer Sister    Diabetes Maternal Grandfather     SOCIAL HISTORY: Social History   Socioeconomic History   Marital status: Married    Spouse name: Not on file   Number of children: Not on file   Years of education: Not on file   Highest education level: Not on file  Occupational History   Not on file  Tobacco Use   Smoking status: Former   Smokeless tobacco: Never   Tobacco comments:    quit 4 yrs ago  Vaping Use   Vaping status: Never Used  Substance and Sexual Activity   Alcohol use: No   Drug use: No   Sexual activity: Not on file  Other Topics Concern   Not on file  Social History Narrative   Not on file   Social Drivers of Health   Financial Resource Strain: Not on file  Food Insecurity: No Food Insecurity (07/04/2023)   Received [facility]   Hunger Vital Sign    Worried About Running Out of Food in the Last Year: Never true    Ran Out of Food in the Last Year: Never true  Transportation Needs: No Transportation Needs (07/04/2023)   Received from [facility]   PRAPARE - Transportation    Lack of Transportation (Medical): No    Lack of Transportation (Non-Medical): No  Physical Activity: Not on file  Stress: Not on file  Social Connections: Not on file    MEDICATIONS:  Current Outpatient Medications  Medication Sig Dispense Refill   aspirin EC 81 MG tablet Take 1 tablet (81 mg total) by mouth daily.     cholecalciferol (VITAMIN D3) 25 MCG (1000 UNIT) tablet Take 1,000 Units by mouth daily.     empagliflozin (JARDIANCE) 25 MG TABS tablet Take 1 tablet  (25 mg total) by mouth in the morning. 90 tablet 3   glimepiride (AMARYL) 4 MG tablet Take 4 mg by mouth at bedtime.     HYDROcodone-acetaminophen (NORCO/VICODIN) 5-325 MG tablet Take 1 tablet by mouth every 6 (six) hours as needed. 8 tablet 0   lisinopril (ZESTRIL) 10 MG tablet Take 10 mg by mouth at bedtime. 10 MG + 20 MG=30 MG     lisinopril (ZESTRIL) 20 MG tablet Take 20 mg by mouth at bedtime.     metFORMIN (GLUCOPHAGE-XR) 500 MG 24 hr tablet Take 2 tablets (1,000 mg total) by  mouth 2 (two) times daily. 360 tablet 3   montelukast (SINGULAIR) 10 MG tablet Take 10 mg by mouth in the morning.     nitroGLYCERIN (NITROSTAT) 0.4 MG SL tablet Place 1 tablet (0.4 mg total) under the tongue every 5 (five) minutes as needed for chest pain. 25 tablet 3   vitamin B-12 (CYANOCOBALAMIN) 500 MCG tablet Take 500 mcg by mouth in the morning.     pravastatin (PRAVACHOL) 40 MG tablet Take 1 tablet (40 mg total) by mouth daily. (Patient not taking: Reported on 09/23/2023) 30 tablet 1   Testosterone Cypionate 200 MG/ML KIT Inject into the muscle. (Patient not taking: Reported on 09/23/2023)     No current facility-administered medications for this visit.    PHYSICAL EXAM: Vitals:   09/23/23 1429  BP: (!) 108/50  Pulse: 71  Resp: 20  SpO2: 97%  Weight: 160 lb 6.4 oz (72.8 kg)  Height: 5\' 7"  (1.702 m)    Body mass index is 25.12 kg/m.  Wt Readings from Last 3 Encounters:  09/23/23 160 lb 6.4 oz (72.8 kg)  08/15/23 159 lb (72.1 kg)  06/11/23 156 lb (70.8 kg)    General: Well developed, well nourished male in no apparent distress.  HEENT: AT/[place], no external lesions.  Eyes: Conjunctiva clear and no icterus. Neck: Neck supple  Lungs: Respirations not labored Neurologic: Alert, oriented, normal speech Extremities / Skin: Dry.  Psychiatric: Does not appear depressed or anxious  Diabetic Foot Exam - Simple   No data filed     LABS Reviewed Lab Results  Component Value Date   HGBA1C 7.4 (A)  09/23/2023   HGBA1C 7.4 (H) 05/24/2023   HGBA1C 7.7 (H) 06/21/2022   No results found for: "FRUCTOSAMINE" Lab Results  Component Value Date   CHOL 156 06/14/2017   HDL 26 (L) 06/14/2017   LDLCALC 89 06/14/2017   TRIG 206 (H) 06/14/2017   CHOLHDL 6.0 06/14/2017   No results found for: "MICRALBCREAT" Lab Results  Component Value Date   CREATININE 0.66 05/24/2023   No results found for: "GFR"  ASSESSMENT / PLAN  1. Type 2 diabetes mellitus with other specified complication, without long-term current use of insulin (HCC)     Diabetes Mellitus type 2, complicated by CAD - Diabetic status / severity: Uncontrolled.  Lab Results  Component Value Date   HGBA1C 7.4 (A) 09/23/2023    - Hemoglobin A1c goal : <6.5-7%  Discussed about limiting carbohydrate and portion control to avoid hyperglycemia.  - Medications: No change in medications.  I) continue metformin extended release 1000 mg 2 times a day. II) continue Jardiance 25 mg daily. III) continue glimepiride  4 mg daily.  - Home glucose testing: In the morning fasting and occasionally at bedtime.  - Discussed/ Gave Hypoglycemia treatment plan.  # Consult : not required at this time.   # Annual urine for microalbuminuria/ creatinine ratio, no microalbuminuria currently, continue ACE/ARB /lisinopril.  He had normal urine protein and creatinine ratio in July 2024. Last No results found for: "MICRALBCREAT"  # Foot check nightly.  # Annual dilated diabetic eye exams.   - Diet: Make healthy diabetic food choices - Life style / activity / exercise: Discussed.  2. Blood pressure  -  BP Readings from Last 1 Encounters:  09/23/23 (!) 108/50    - Control is in target.  - No change in current plans.  3. Lipid status / Hyperlipidemia - Last  Lab Results  Component Value Date   [facility]  89 06/14/2017   - Continue pravastatin 40 mg daily.  Managed by primary care provider.  Diagnoses and all orders for this  visit:  Type 2 diabetes mellitus with other specified complication, without long-term current use of insulin (HCC) -     POCT glycosylated hemoglobin (Hb A1C)     DISPOSITION Follow up in clinic in 3 months suggested.   All questions answered and patient verbalized understanding of the plan.  Jason Kambre Messner, MD [facility] Endocrinology [facility] [address] [place], [address] [place], [place] [address] Phone # [phone]  At least part of this note was generated using voice recognition software. Inadvertent word errors may have occurred, which were not recognized during the proofreading process.

## 2023-09-23 NOTE — Patient Instructions (Signed)
 Metformin extended release 1000 mg 2 times a day. Jardiance 25 mg daily. Glimepiride 4 mg daily.

## 2023-11-12 ENCOUNTER — Other Ambulatory Visit: Payer: Self-pay | Admitting: Urology

## 2023-12-05 NOTE — Progress Notes (Addendum)
 COVID Vaccine received:  []  No [x]  Yes Date of any COVID positive Test in last 90 days:  None  PCP - Damien Coupe  FNP at [facility] [phone]  Fax) [phone] Cardiologist - Maude Emmer, MD  Endocrinology- [place] Thapa, MD   Chest x-ray -03-25-2019  2v  Epic  EKG -  04-16-2023  Epic Stress Test - 01-20-2013  Epic ECHO - 08-07-2022  Epic Cardiac Cath -  CT Coronary Calcium score:   Bowel Prep - [x]  No  []   Yes ______  Pacemaker / ICD device [x]  No []  Yes   Spinal Cord Stimulator:[x]  No []  Yes       History of Sleep Apnea? [x]  No []  Yes   CPAP used?- [x]  No []  Yes    Does the patient monitor blood sugar?   []  N/A   []  No [x]  Yes  Patient has: []  NO Hx DM   []  Pre-DM   []  DM1  [x]   DM2   Last A1c was:  7.4  on   09-23-2023     Does patient have a [employer] or Dexcom? [x]  No []  Yes   Fasting Blood Sugar Ranges- 130-180 Checks Blood Sugar 1-2_ times a week  Metformin -  Hold DOS Glimepiride- Day before take at Lunch no HS, DOS: do not take Empagliflozin  (Jardiance )  Hold x 72 hours  Last dose: Friday 12-06-23  Blood Thinner / Instructions:None Aspirin  Instructions:  ASA 81 mg  hold x 1 week, patient aware  ERAS Protocol Ordered: [x]  No  []  Yes Patient is to be NPO after: MN Prior  Dental hx: []  Dentures:  [x]  N/A      []  Bridge or Partial:                   []  Loose or Damaged teeth:   Comments: TAKE BP IN PATIENT'S LEFT ARM d/t subclavian steal syndrome.   Patient had recent URI and was seen by his PCP on 11-01-23. Per patient, he doesn't have any residual cough or chest congestion.   Activity level: Able to walk up 2 flights of stairs without becoming significantly short of breath or having chest pain?  [x]  No    []    Yes   Anesthesia review: CAD- CABG x 4 (2008), uncontrolled DM2, HTN, subclavian steal syn, Carotid and vertebral artery stenosis (sees Dr. Gretta), Hx TIA, ACDF  C5-6 (2020)  Patient denies shortness of breath, fever, cough and chest  pain at PAT appointment.  Patient verbalized understanding and agreement to the Pre-Surgical Instructions that were given to them at this PAT appointment. Patient was also educated of the need to review these PAT instructions again prior to his surgery.I reviewed the appropriate phone numbers to call if they have any and questions or concerns.

## 2023-12-05 NOTE — Patient Instructions (Addendum)
 SURGICAL WAITING ROOM VISITATION Patients having surgery or a procedure may have no more than 2 support people in the waiting area - these visitors may rotate in the visitor waiting room.   If the patient needs to stay at the hospital during part of their recovery, the visitor guidelines for inpatient rooms apply.  PRE-OP VISITATION  Pre-op nurse will coordinate an appropriate time for 1 support person to accompany the patient in pre-op.  This support person may not rotate.  This visitor will be contacted when the time is appropriate for the visitor to come back in the pre-op area.  Please refer to the [facility] website for the visitor guidelines for Inpatients (after your surgery is over and you are in a regular room).  You are not required to quarantine at this time prior to your surgery. However, you must do this: Hand Hygiene often Do NOT share personal items Notify your provider if you are in close contact with someone who has COVID or you develop fever 100.4 or greater, new onset of sneezing, cough, sore throat, shortness of breath or body aches.  If you test positive for Covid or have been in contact with anyone that has tested positive in the last 10 days please notify you surgeon.    Your procedure is scheduled on:  TUESDAY  December 10, 2023  Report to [facility] Main Entrance: [address] entrance where the [employer] is available.   Report to admitting at: 05:15 AM  Call this number if you have any questions or problems the morning of surgery [phone]  DO NOT EAT OR DRINK ANYTHING AFTER MIDNIGHT THE NIGHT PRIOR TO YOUR SURGERY / PROCEDURE.   FOLLOW  ANY ADDITIONAL PRE OP INSTRUCTIONS YOU RECEIVED FROM YOUR SURGEON'S OFFICE!!!   Oral Hygiene is also important to reduce your risk of infection.        Remember - BRUSH YOUR TEETH THE MORNING OF SURGERY WITH YOUR REGULAR TOOTHPASTE  Do NOT smoke after Midnight the night before surgery.  Metformin -  Day before, take  as usual.  DO NOT TAKE the morning of your surgery Glimepiride- Day before take at Lunch no HS, DO NOT TAKE the morning of your surgery Empagliflozin  (Jardiance )  stop x  72 hours before surgery.   Last dose will be taken on  Friday 12-06-23  STOP TAKING all Vitamins, Herbs and supplements 1 week before your surgery.   ASPIRIN -  Stop taking 5-7 days before surgery.   Take ONLY these medicines the morning of surgery with A SIP OF WATER: Montelukast  and you may use your Albuterol inhaler if needed. Please bring your inhaler with you on the day of surgery.                   You may not have any metal on your body including jewelry, and body piercing  Do not wear  lotions, powders, cologne, or deodorant  Men may shave face and neck.  Contacts, Hearing Aids, dentures or bridgework may not be worn into surgery. DENTURES WILL BE REMOVED PRIOR TO SURGERY PLEASE DO NOT APPLY Poly grip OR ADHESIVES!!!  Patients discharged on the day of surgery will not be allowed to drive home.  Someone NEEDS to stay with you for the first 24 hours after anesthesia.  Do not bring your home medications to the hospital. The Pharmacy will dispense medications listed on your medication list to you during your admission in the Hospital.  Please read over the  following fact sheets you were given: IF YOU HAVE QUESTIONS ABOUT YOUR PRE-OP INSTRUCTIONS, PLEASE CALL [phone].   [facility] - Preparing for Surgery Before surgery, you can play an important role.  Because skin is not sterile, your skin needs to be as free of germs as possible.  You can reduce the number of germs on your skin by washing with CHG (chlorahexidine gluconate) soap before surgery.  CHG is an antiseptic cleaner which kills germs and bonds with the skin to continue killing germs even after washing. Please DO NOT use if you have an allergy to CHG or antibacterial soaps.  If your skin becomes reddened/irritated stop using the CHG and inform your nurse  when you arrive at Short Stay. Do not shave (including legs and underarms) for at least 48 hours prior to the first CHG shower.  You may shave your face/neck.  Please follow these instructions carefully:  1.  Shower with CHG Soap the night before surgery and the  morning of surgery.  2.  If you choose to wash your hair, wash your hair first as usual with your normal  shampoo.  3.  After you shampoo, rinse your hair and body thoroughly to remove the shampoo.                             4.  Use CHG as you would any other liquid soap.  You can apply chg directly to the skin and wash.  Gently with a scrungie or clean washcloth.  5.  Apply the CHG Soap to your body ONLY FROM THE NECK DOWN.   Do not use on face/ open                           Wound or open sores. Avoid contact with eyes, ears mouth and genitals (private parts).                       Wash face,  Genitals (private parts) with your normal soap.             6.  Wash thoroughly, paying special attention to the area where your  surgery  will be performed.  7.  Thoroughly rinse your body with warm water from the neck down.  8.  DO NOT shower/wash with your normal soap after using and rinsing off the CHG Soap.            9.  Pat yourself dry with a clean towel.            10.  Wear clean pajamas.            11.  Place clean sheets on your bed the night of your first shower and do not  sleep with pets.  ON THE DAY OF SURGERY : Do not apply any lotions/deodorants the morning of surgery.  Please wear clean clothes to the hospital/surgery center.    FAILURE TO FOLLOW THESE INSTRUCTIONS MAY RESULT IN THE CANCELLATION OF YOUR SURGERY  PATIENT SIGNATURE_________________________________  NURSE SIGNATURE__________________________________  ________________________________________________________________________

## 2023-12-06 ENCOUNTER — Other Ambulatory Visit: Payer: Self-pay

## 2023-12-06 ENCOUNTER — Encounter (HOSPITAL_COMMUNITY)
Admission: RE | Admit: 2023-12-06 | Discharge: 2023-12-06 | Disposition: A | Discharge status: Home / Self Care | Source: Ambulatory Visit | Attending: Urology | Admitting: Urology

## 2023-12-06 ENCOUNTER — Encounter (HOSPITAL_COMMUNITY): Payer: Self-pay

## 2023-12-06 VITALS — BP 133/58 | HR 72 | Temp 98.0°F | Resp 14 | Ht 67.0 in | Wt 160.0 lb

## 2023-12-06 DIAGNOSIS — Z01812 Encounter for preprocedural laboratory examination: Secondary | ICD-10-CM | POA: Diagnosis present

## 2023-12-06 DIAGNOSIS — E119 Type 2 diabetes mellitus without complications: Secondary | ICD-10-CM | POA: Diagnosis not present

## 2023-12-06 DIAGNOSIS — Z87891 Personal history of nicotine dependence: Secondary | ICD-10-CM | POA: Insufficient documentation

## 2023-12-06 DIAGNOSIS — Z951 Presence of aortocoronary bypass graft: Secondary | ICD-10-CM | POA: Diagnosis not present

## 2023-12-06 DIAGNOSIS — Z7984 Long term (current) use of oral hypoglycemic drugs: Secondary | ICD-10-CM | POA: Insufficient documentation

## 2023-12-06 DIAGNOSIS — Z01818 Encounter for other preprocedural examination: Secondary | ICD-10-CM

## 2023-12-06 DIAGNOSIS — I10 Essential (primary) hypertension: Secondary | ICD-10-CM | POA: Insufficient documentation

## 2023-12-06 DIAGNOSIS — G4730 Sleep apnea, unspecified: Secondary | ICD-10-CM | POA: Insufficient documentation

## 2023-12-06 DIAGNOSIS — I2510 Atherosclerotic heart disease of native coronary artery without angina pectoris: Secondary | ICD-10-CM | POA: Diagnosis not present

## 2023-12-06 DIAGNOSIS — C671 Malignant neoplasm of dome of bladder: Secondary | ICD-10-CM | POA: Insufficient documentation

## 2023-12-06 HISTORY — DX: Pneumonia, unspecified organism: J18.9

## 2023-12-06 HISTORY — DX: Other transient cerebral ischemic attacks and related syndromes: G45.8

## 2023-12-06 LAB — BASIC METABOLIC PANEL WITH GFR
Anion gap: 8 (ref 5–15)
BUN: 12 mg/dL (ref 8–23)
CO2: 25 mmol/L (ref 22–32)
Calcium: 9.5 mg/dL (ref 8.9–10.3)
Chloride: 107 mmol/L (ref 98–111)
Creatinine, Ser: 0.56 mg/dL — ABNORMAL LOW (ref 0.61–1.24)
GFR, Estimated: >60 mL/min (ref >60)
Glucose, Bld: 187 mg/dL — ABNORMAL HIGH (ref 70–99)
Potassium: 4.7 mmol/L (ref 3.5–5.1)
Sodium: 140 mmol/L (ref 135–145)

## 2023-12-06 LAB — CBC
HCT: 42.5 % (ref 39.0–52.0)
Hemoglobin: 14 g/dL (ref 13.0–17.0)
MCH: 32 pg (ref 26.0–34.0)
MCHC: 32.9 g/dL (ref 30.0–36.0)
MCV: 97 fL (ref 80.0–100.0)
Platelets: 277 10*3/uL (ref 150–400)
RBC: 4.38 MIL/uL (ref 4.22–5.81)
RDW: 13.6 % (ref 11.5–15.5)
WBC: 7.9 10*3/uL (ref 4.0–10.5)
nRBC: 0 % (ref 0.0–0.2)

## 2023-12-06 LAB — HEMOGLOBIN A1C
Hgb A1c MFr Bld: 7.7 % — ABNORMAL HIGH (ref 4.8–5.6)
Mean Plasma Glucose: 174.29 mg/dL

## 2023-12-06 LAB — GLUCOSE, CAPILLARY: Glucose-Capillary: 232 mg/dL — ABNORMAL HIGH (ref 70–99)
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
## 2023-12-09 NOTE — Anesthesia Preprocedure Evaluation (Addendum)
 Anesthesia Evaluation  Patient identified by MRN, date of birth, ID band Patient awake    Reviewed: Allergy & Precautions, H&P , NPO status , Patient's Chart, lab work & pertinent test results  Airway Mallampati: III  TM Distance: <3 FB Neck ROM: Full    Dental  (+) Dental Advisory Given, Teeth Intact   Pulmonary sleep apnea , pneumonia, former smoker   Pulmonary exam normal breath sounds clear to auscultation       Cardiovascular hypertension, Pt. on medications + CAD, + Past MI and + CABG  + Valvular Problems/Murmurs  Rhythm:Regular Rate:Normal + Systolic murmurs Echo 07/2022  1. Left ventricular ejection fraction, by estimation, is 60 to 65%. The left ventricle has normal function. The left ventricle has no regional wall motion abnormalities. There is mild left ventricular hypertrophy. Left ventricular diastolic parameters are consistent with Grade II diastolic dysfunction (pseudonormalization).   2. Right ventricular systolic function is normal. The right ventricular size is normal. Tricuspid regurgitation signal is inadequate for assessing PA pressure.   3. Left atrial size was mildly dilated.   4. No evidence of mitral valve regurgitation.   5. Aortic valve regurgitation is not visualized.   6. The inferior vena cava is normal in size with greater than 50% respiratory variability, suggesting right atrial pressure of 3 mmHg.   Comparison(s): No significant change from prior study.     Neuro/Psych TIA negative psych ROS   GI/Hepatic negative GI ROS, Neg liver ROS,,,  Endo/Other  diabetes, Type 2, Oral Hypoglycemic Agents    Renal/GU negative Renal ROS     Musculoskeletal  (+) Arthritis , Osteoarthritis,    Abdominal   Peds  Hematology  (+) Blood dyscrasia, anemia   Anesthesia Other Findings   Reproductive/Obstetrics                             Anesthesia Physical Anesthesia Plan  ASA:  3  Anesthesia Plan: General   Post-op Pain Management: Tylenol  PO (pre-op)*   Induction: Intravenous  PONV Risk Score and Plan: 3 and Ondansetron , Dexamethasone  and Treatment may vary due to age or medical condition  Airway Management Planned: Oral ETT and Video Laryngoscope Planned  Additional Equipment:   Intra-op Plan:   Post-operative Plan: Extubation in OR  Informed Consent: I have reviewed the patients History and Physical, chart, labs and discussed the procedure including the risks, benefits and alternatives for the proposed anesthesia with the patient or authorized representative who has indicated his/her understanding and acceptance.     Dental advisory given  Plan Discussed with: CRNA  Anesthesia Plan Comments: (See PAT note 12/06/2023)       Anesthesia Quick Evaluation

## 2023-12-09 NOTE — H&P (Signed)
 1. History of prostate cancer s/p seeds in 2011. PSA undetectible.   2. Hypogonadism. He is off his injections currently.   3. 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   11/06/23: Jason Bailey returns today in f/u for his history of bladder cancer. He completed BCG on 08/02/23 for HG NMIBC. He had some symptoms with the last couple. His PSA remains undetectible <0.015 but his T is only 64. He stopped the testosterone injections in November. UA has 2+ glucose.   12/06/2023: Patient with history of bladder cancer presents today for preoperative history and physical exam in anticipation of undergoing TURBT on 12/10/2023 with Dr. Watt. Today he is doing well and without complaints. He denies gross hematuria, dysuria, fever/chills, flank pain, nausea/vomiting, chest pain, shortness of breath, headaches.     ALLERGIES: Lipitor TABS Toprol XL TB24    MEDICATIONS: Aspirin  81 MG TABS Oral  B-12 TABS Oral  Jardiance   Lisinopril  10 MG Oral Tablet Oral  metFORMIN  HCl ER 500 MG Tablet Extended Release 24 Hour 0 Oral  Pravastatin  Sodium 40 MG Tablet Oral  Testosterone Cypionate 200 MG/ML Solution 0.5 ml IM Q1WK     GU PSH: Bladder Instill AntiCA Agent - 08/02/2023, 07/26/2023, 07/19/2023, 07/12/2023, 07/05/2023, 06/28/2023 Cystoscopy - 11/06/2023, 04/24/2023       PSH Notes: Heart Surgery, Shoulder Surgery, Coronary Artery Quadruple Venous Bypass Graft   NON-GU PSH: Back Surgery (Unspecified) - 2020 Cabg; Vein; Four - 2010 Shoulder Surgery (Unspecified), Right - 2023 Visit Complexity (formerly GPC1X) - 06/07/2023, 09/10/2022     GU PMH: Bladder Cancer Dome - 11/06/2023 History of bladder cancer, He appears to have a recurrent tumor on the bladder dome. I will get him set up for cystoscopy with bil RTG's, TURBT and gemcitabine  instillation. I have reviewed the risks in detail. - 11/06/2023 History of prostate cancer, PSA remains low. - 11/06/2023, - 04/24/2023, -  04/09/2023, - 03/06/2023, His last PSA was undetectible and I will get that again today. f/u 1 years. , - 09/10/2022, He is doing well and his PSA remained undetectible at last check. I will get labs today and when he returns in a year. , - 2023, He is doing well and his psa's have been undetectible. He will get labs today. f/u in 6 months. , - 2022, He is doing well and his last PSA was undectable. I will get labs today and at f/u in 6 months. , - 2022, He is doing well and his PSA's have remained undetectible. He will have that done today and when he returns in 6 months. , - 2021 (Stable), His last PSA was undetectible and will be repeated today. , - 2020, He is doing well without new complaints. PSA today. Return in 6 months., - 2019, History of malignant neoplasm of prostate, - 2017, Prostate Cancer, - 2014 Primary hypogonadism, His T is very low off of therapy. I have recommended he resume the TRT. - 11/06/2023, I have refilled the med and will check labs. , - 09/10/2022, He continues to do well on TRT. Med refilled. , - 2023, He is doing well on TRT and had a script sent in last month when his appointment was delayed., - 2022, Labs today. he got a reflill in January between visits. , - 2022, His testosterone was refilled on 6/28. He just got it so the labs today will be a trough level. I will get a testosterone and H&H. , - 2021 (Stable), I  have refilled the testosterone and will get labs. , - 2020, T and H&H today. Med refilled. , - 2019 (Stable), The testosterone and H&H were normal at the last visit and will be checked to day. Testosterone refilled. , - 2019, - 2018 (Stable), He is doing well on TRT but may want to shorten the interval. He had a borderline Hgb on his last visit. I will check that today and discussed blood draw. I have refill the testosterone. , - 2018 (Stable), - 2017, Hypogonadism, testicular, - 2017 Stress Incontinence - 11/06/2023, He has stable SUI with some urgency and UUI. , - 09/10/2022, He  has stable mild SUI. , - 2023, He will leak a little if he coughs. , - 2022 Bladder Cancer Lateral - 08/02/2023, - 07/26/2023, - 07/19/2023, - 07/12/2023, - 07/05/2023, - 06/28/2023, I will get him started on BCG weekly for 6 weeks and then he will need cystoscopy in 3-4 mo. I reviewed the reason for BCG and maintenance, the procedure and the side effects and risks as well as the alternatives. 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 I will get him set up for cystoscopy with a TURBT and with gemcitabine  instillation. I have reviewed the risks of the procedure including bleeding, infection, bladder and ureteral injury, incontinence, chemical cystitis, thrombotic events and anesthetic complications. - 04/24/2023 Microscopic hematuria - 04/24/2023 Dysuria - 04/09/2023 Urinary Frequency - 04/09/2023 Low back pain - 03/06/2023 Renal calculus - 03/06/2023 Urge incontinence - 03/06/2023, - 09/10/2022, He has occasional issues but is not bothered by it. , - 2022, Urge Incontinence Of Urine, - 2014 ED due to arterial insufficiency, He is functional but is having increased issues but isn't interested in therapy. - 09/10/2022, He continues to do well with the TRT. , - 2023, He is functional. , - 2022, Erectile dysfunction due to arterial insufficiency, - 2017 Prostate Cancer - 2018 Elevated PSA, PSA,Elevated - 2014 Nocturia, Nocturia - 2014 Prostate nodule w/o LUTS, Prostate Hard Area Or Nodule - 2014      PMH Notes:  2009-06-08 16:03:13 - Note: Acute Myocardial Infarction  2009-06-08 16:03:13 - Note: Arthritis   NON-GU PMH: Encounter for general adult medical examination without abnormal findings, Encounter for preventive health examination - 2017 Generalized enlarged lymph nodes, Lymphadenopathy - 2014 Personal history of other diseases of the circulatory system, History of cardiac disorder - 2014 Personal history of other endocrine, nutritional  and metabolic disease, History of diabetes mellitus - 2014 Personal history of other specified conditions, History of urinary frequency - 2014    FAMILY HISTORY: Breast Cancer - Mother Death In The Family Father - Father Death In The Family Mother - Mother Family Health Status Number - Runs In Family liver cancer - Father   SOCIAL HISTORY: Marital Status: Married Preferred Language: English; Ethnicity: Not Hispanic Or Latino; Race: White Current Smoking Status: Patient does not smoke anymore.   Tobacco Use Assessment Completed: Used Tobacco in last 30 days? Social Drinker.  Drinks 4+ caffeinated drinks per day.     Notes: Tobacco use, Former smoker, Occupation:, Alcohol Use, Caffeine Use, Marital History - Currently Married   REVIEW OF SYSTEMS:    GU Review Male:   Patient denies frequent urination, hard to postpone urination, burning/ pain with urination, get up at night to urinate, leakage of urine, stream starts and stops, trouble starting your stream, have to strain to urinate , erection problems, and penile pain.  Gastrointestinal (Upper):   Patient denies nausea, vomiting, and indigestion/ heartburn.  Gastrointestinal (Lower):   Patient denies diarrhea and constipation.  Constitutional:   Patient denies fever, night sweats, weight loss, and fatigue.  Skin:   Patient denies skin rash/ lesion and itching.  Eyes:   Patient denies blurred vision and double vision.  Ears/ Nose/ Throat:   Patient denies sore throat and sinus problems.  Hematologic/Lymphatic:   Patient denies swollen glands and easy bruising.  Cardiovascular:   Patient denies leg swelling and chest pains.  Respiratory:   Patient denies cough and shortness of breath.  Endocrine:   Patient denies excessive thirst.  Musculoskeletal:   Patient denies back pain and joint pain.  Neurological:   Patient denies headaches and dizziness.  Psychologic:   Patient denies depression and anxiety.   VITAL SIGNS:      12/06/2023  10:35 AM  BP 143/69 mmHg  Pulse 65 /min  Temperature 97.5 F / 36.3 C   MULTI-SYSTEM PHYSICAL EXAMINATION:    Constitutional: Well-nourished. No physical deformities. Normally developed. Good grooming.  Neck: Neck symmetrical, not swollen. Normal tracheal position.  Respiratory: No labored breathing, no use of accessory muscles.   Cardiovascular: Normal temperature, normal extremity pulses, no swelling, no varicosities.  Skin: No paleness, no jaundice, no cyanosis. No lesion, no ulcer, no rash.  Neurologic / Psychiatric: Oriented to time, oriented to place, oriented to person. No depression, no anxiety, no agitation.  Gastrointestinal: No mass, no tenderness, no rigidity, non obese abdomen.  Eyes: Normal conjunctivae. Normal eyelids.  Musculoskeletal: Normal gait and station of head and neck.     Complexity of Data:  Source Of History:  Patient, Family/Caregiver, Medical Record Summary  Lab Test Review:   PSA  Records Review:   Pathology Reports, Previous Doctor Records, Previous Patient Records  Urine Test Review:   Urinalysis   10/23/23 09/10/22 09/06/21 03/10/21 08/17/20 12/21/19 05/22/19 11/17/18  PSA  Total PSA <0.015 ng/mL <0.015 ng/mL <0.015 ng/mL <0.015 ng/mL <0.015 ng/mL <0.015 ng/mL <0.015 ng/mL <0.015 ng/mL    12/06/23  Urinalysis  Urine Appearance Clear   Urine Color Yellow   Urine Glucose 3+ mg/dL  Urine Bilirubin Neg mg/dL  Urine Ketones Neg mg/dL  Urine Specific Gravity 1.015   Urine Blood Neg ery/uL  Urine pH <=5.0   Urine Protein Neg mg/dL  Urine Urobilinogen 0.2 mg/dL  Urine Nitrites Neg   Urine Leukocyte Esterase Neg leu/uL   PROCEDURES:          Visit Complexity - G2211          Urinalysis Dipstick Dipstick Cont'd  Color: Yellow Bilirubin: Neg mg/dL  Appearance: Clear Ketones: Neg mg/dL  Specific Gravity: 8.984 Blood: Neg ery/uL  pH: <=5.0 Protein: Neg mg/dL  Glucose: 3+ mg/dL Urobilinogen: 0.2 mg/dL    Nitrites: Neg    Leukocyte Esterase: Neg  leu/uL    ASSESSMENT:      ICD-10 Details  1 GU:   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 Pt is scheduled to undergo TURBT on  12/10/2023.   All questions were answered to the best of my ability.         Next Appointment:      Next Appointment: 12/10/2023 07:30 AM    Appointment Type: Surgery     Location: [facility] [phone]    Provider: Norleen Seltzer, M.D.    Reason for Visit: OP---[facility]--CY, BIL RTG, TURBT, GEMCITABINE 

## 2023-12-09 NOTE — Progress Notes (Signed)
 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 former smoker with h/o HTN, subclavian artery stenosis, sleep apnea, DM II, CAD s/p CABG 2008, bladder caner scheduled for above procedure 12/10/2023 with Dr. Rush Watt.   Pt reports he can climb a flight of stairs without difficulty, no chest pain or shortness of breath.  Reports he is active and works daily.  Walks.   Pt last seen by cardiology 04/16/2023.  Pt OV note no angina.  In regards to carotid bruit, 1-39% bilateral plaque duplex 08/07/22 Has right subclavian velocity of 4.44cm/sec with atypical vertebral flow  on left and 5.35m/sec and atypical antegrade flow on right with 45 mm diff in BP CTA 09/30/22 with string sign right subclavian and left subclavian stenosis with reconstituted vertebral Seen by Dr Melvenia Ap[ril and felt stenting /PCI of subclavian and vertebrals would be difficult Would need bypass and transposition of vertebrals. Not sure this surgery is done here Refer back to [facility] Dr Melvenia is now retired   Per previous notes from Dr. Eliza given that pt is asymptomatic would not recommend aggressive surgical approach unless he was developing worsening vertebrobasilar sx.  He is scheduled for yearly carotid duplex studies and follow up with Dr. Eliza. Last seen by Vascular 06/11/2023. Per notes no intervention at this time given no worsening sx.  He will follow up with vascular in 1 year.   S/p TURBT 05/28/2023. Glidescope used. Per anesthesia notes:   Difficulty Due To: Difficulty was unanticipated and Difficult Airway- due to  anterior larynx Comments: IV induction, easy mask airway, DVL x 1 with view of epiglottis only, Glidescope x1 with partial view of vocal cords. Atraumatic intubation, +/= BBS confirmed.  VS: BP (!) 133/58 Comment: Left arm sitting  Pulse 72   Temp 36.7 C (Oral)   Resp 14   Ht 5' 7 (1.702 m)   Wt 72.6 kg   SpO2 99%   BMI 25.06 kg/m   PROVIDERS: Dolleschel, Damien, FNP is PCP    LABS: Labs reviewed: Acceptable for surgery. (all labs ordered are listed, but only abnormal results are displayed)  Labs Reviewed  HEMOGLOBIN A1C - Abnormal; Notable for the following components:      Result Value   Hgb A1c MFr Bld 7.7 (*)    All other components within normal limits  BASIC METABOLIC PANEL WITH GFR - Abnormal; Notable for the following components:   Glucose, Bld 187 (*)    Creatinine, Ser 0.56 (*)    All other components within normal limits  GLUCOSE, CAPILLARY - Abnormal; Notable for the following components:   Glucose-Capillary 232 (*)    All other components within normal limits  CBC     IMAGES: Carotid US  06/11/2023 Summary:  Right Carotid: Velocities in the right ICA are consistent with a 1-39%  stenosis.                The ECA appears >50% stenosed.   Left Carotid: Velocities in the left ICA are consistent with a 1-39%  stenosis.  The ECA appears >50% stenosed.   EKG:   CV: Echo 08/07/2022 1. Left ventricular ejection fraction, by estimation, is 60 to 65%. The  left ventricle has normal function. The left ventricle has no regional  wall motion abnormalities. There is mild left ventricular hypertrophy.  Left ventricular diastolic parameters  are consistent with Grade II diastolic dysfunction (pseudonormalization).   2. Right ventricular systolic function is normal. The right ventricular  size is normal. Tricuspid regurgitation signal is inadequate for assessing  PA pressure.   3. Left atrial size was mildly dilated.   4. No evidence of mitral valve  regurgitation.   5. Aortic valve regurgitation is not visualized.   6. The inferior vena cava is normal in size with greater than 50%  respiratory variability, suggesting right atrial pressure of 3 mmHg.  Past Medical History:  Diagnosis Date   Cancer [facility])    prostate   Chest pain    Coronary artery disease    Diabetes mellitus    Heart attack (HCC)    approx. 2009   Heart murmur    History of kidney stones    Hyperlipidemia    Hypertension    Malaise and fatigue    Osteoarthritis    Pneumonia    Right foot drop    Right shoulder pain    Sleep apnea    Subclavian steal syndrome    ONLY DO BPs IN THE LEFT ARM   Vision abnormalities     Past Surgical History:  Procedure Laterality Date   ANTERIOR CERVICAL DECOMP/DISCECTOMY FUSION N/A 03/26/2019   Procedure: ANTERIOR CERVICAL DECOMPRESSION/DISCECTOMY FUSION ONE LEVEL, CERVICAL FIVE - CERVICAL SIX;  Surgeon: Alix Charleston, MD;  Location: [facility];  Service: Neurosurgery;  Laterality: N/A;  ANTERIOR CERVICAL DECOMPRESSION/DISCECTOMY FUSION ONE LEVEL, CERVICAL FIVE - CERVICAL SIX    COLONOSCOPY WITH PROPOFOL  N/A 06/22/2022   Procedure: COLONOSCOPY WITH PROPOFOL ;  Surgeon: Saintclair Jasper, MD;  Location: [facility];  Service: Gastroenterology;  Laterality: N/A;   CORONARY ARTERY BYPASS GRAFT  02/14/2007   x 4  at [facility]   FRACTURE SURGERY Right 1995   ORIF of shoulder ,    later had hardware removed.   KNEE SURGERY Right 1995   ORIF after tree fell on him   POLYPECTOMY  06/22/2022   Procedure: POLYPECTOMY;  Surgeon: Saintclair Jasper, MD;  Location: [facility];  Service: Gastroenterology;;   TONSILLECTOMY      MEDICATIONS:  albuterol (VENTOLIN HFA) 108 (90 Base) MCG/ACT inhaler   aspirin  EC 81 MG tablet   cholecalciferol (VITAMIN D3) 25 MCG (1000 UNIT) tablet   empagliflozin  (JARDIANCE ) 25 MG TABS tablet   glimepiride (AMARYL) 4 MG tablet   HYDROcodone -acetaminophen  (NORCO/VICODIN) 5-325 MG tablet   lisinopril  (ZESTRIL ) 20 MG  tablet   metFORMIN  (GLUCOPHAGE -XR) 500 MG 24 hr tablet   montelukast  (SINGULAIR ) 10 MG tablet   nitroGLYCERIN  (NITROSTAT ) 0.4 MG SL tablet   pravastatin  (PRAVACHOL ) 40 MG tablet   testosterone cypionate (DEPOTESTOSTERONE CYPIONATE) 200 MG/ML injection   vitamin B-12 (CYANOCOBALAMIN ) 500 MCG tablet   No current facility-administered medications for this encounter.   Harlene Hoots Ward, PA-C [facility] [phone]

## 2023-12-10 ENCOUNTER — Encounter (HOSPITAL_COMMUNITY)
Admission: RE | Disposition: A | Discharge status: Home / Self Care | Payer: Self-pay | Source: Home / Self Care | Attending: Urology

## 2023-12-10 ENCOUNTER — Ambulatory Visit (HOSPITAL_COMMUNITY)

## 2023-12-10 ENCOUNTER — Ambulatory Visit (HOSPITAL_BASED_OUTPATIENT_CLINIC_OR_DEPARTMENT_OTHER): Admitting: Certified Registered Nurse Anesthetist

## 2023-12-10 ENCOUNTER — Encounter (HOSPITAL_COMMUNITY): Payer: Self-pay | Admitting: Urology

## 2023-12-10 ENCOUNTER — Ambulatory Visit (HOSPITAL_COMMUNITY)
Admission: RE | Admit: 2023-12-10 | Discharge: 2023-12-10 | Disposition: A | Discharge status: Home / Self Care | Attending: Urology | Admitting: Urology

## 2023-12-10 ENCOUNTER — Encounter (HOSPITAL_COMMUNITY): Admitting: Physician Assistant

## 2023-12-10 DIAGNOSIS — Z951 Presence of aortocoronary bypass graft: Secondary | ICD-10-CM | POA: Insufficient documentation

## 2023-12-10 DIAGNOSIS — Z8546 Personal history of malignant neoplasm of prostate: Secondary | ICD-10-CM | POA: Diagnosis not present

## 2023-12-10 DIAGNOSIS — I2510 Atherosclerotic heart disease of native coronary artery without angina pectoris: Secondary | ICD-10-CM | POA: Insufficient documentation

## 2023-12-10 DIAGNOSIS — N3289 Other specified disorders of bladder: Secondary | ICD-10-CM | POA: Diagnosis not present

## 2023-12-10 DIAGNOSIS — I252 Old myocardial infarction: Secondary | ICD-10-CM | POA: Insufficient documentation

## 2023-12-10 DIAGNOSIS — Z87891 Personal history of nicotine dependence: Secondary | ICD-10-CM | POA: Diagnosis not present

## 2023-12-10 DIAGNOSIS — Z7984 Long term (current) use of oral hypoglycemic drugs: Secondary | ICD-10-CM | POA: Insufficient documentation

## 2023-12-10 DIAGNOSIS — C671 Malignant neoplasm of dome of bladder: Secondary | ICD-10-CM | POA: Diagnosis present

## 2023-12-10 DIAGNOSIS — Z01818 Encounter for other preprocedural examination: Secondary | ICD-10-CM

## 2023-12-10 DIAGNOSIS — C679 Malignant neoplasm of bladder, unspecified: Secondary | ICD-10-CM | POA: Diagnosis not present

## 2023-12-10 DIAGNOSIS — E119 Type 2 diabetes mellitus without complications: Secondary | ICD-10-CM | POA: Insufficient documentation

## 2023-12-10 DIAGNOSIS — I10 Essential (primary) hypertension: Secondary | ICD-10-CM | POA: Insufficient documentation

## 2023-12-10 HISTORY — PX: CYSTOSCOPY W/ RETROGRADES: SHX1426

## 2023-12-10 LAB — GLUCOSE, CAPILLARY
Glucose-Capillary: 109 mg/dL — ABNORMAL HIGH (ref 70–99)
Glucose-Capillary: 140 mg/dL — ABNORMAL HIGH (ref 70–99)

## 2023-12-10 SURGERY — TURBT, WITH CHEMOTHERAPEUTIC AGENT INSTILLATION INTO BLADDER
Anesthesia: General | Site: Bladder

## 2023-12-10 MED ORDER — SODIUM CHLORIDE 0.9% FLUSH: 3.0000 mL | Freq: Two times a day (BID) | INTRAVENOUS | Status: DC

## 2023-12-10 MED ORDER — ORAL CARE MOUTH RINSE: 15.0000 mL | Freq: Once | OROMUCOSAL | Status: AC

## 2023-12-10 MED ORDER — GEMCITABINE CHEMO FOR BLADDER INSTILLATION 2000 MG
2000.0000 mg | Freq: Once | INTRAVENOUS | Status: DC
  Administered 2023-12-10: 2000 mg via INTRAVESICAL

## 2023-12-10 MED ORDER — IOHEXOL 300 MG/ML  SOLN
INTRAMUSCULAR | Status: DC | PRN
  Administered 2023-12-10: 10 mL via URETHRAL

## 2023-12-10 MED ORDER — SODIUM CHLORIDE 0.9 % IR SOLN
Status: DC | PRN
  Administered 2023-12-10: 3000 mL

## 2023-12-10 MED ORDER — GEMCITABINE CHEMO FOR BLADDER INSTILLATION 2000 MG: 2000.0000 mg | Freq: Once | INTRAVENOUS | Status: DC

## 2023-12-10 MED ORDER — LACTATED RINGERS IV SOLN: INTRAVENOUS | Status: DC

## 2023-12-10 MED ORDER — CEFAZOLIN SODIUM-DEXTROSE 2-4 GM/100ML-% IV SOLN
2.0000 g | INTRAVENOUS | Status: AC
  Administered 2023-12-10: 2 g via INTRAVENOUS
  Filled 2023-12-10: qty 100

## 2023-12-10 MED ORDER — SODIUM CHLORIDE 0.9% FLUSH
3.0000 mL | INTRAVENOUS | Status: DC | PRN
Start: 2023-12-10 — End: 2023-12-10

## 2023-12-10 MED ORDER — FENTANYL CITRATE (PF) 100 MCG/2ML IJ SOLN
INTRAMUSCULAR | Status: AC
  Filled 2023-12-10: qty 2

## 2023-12-10 MED ORDER — FENTANYL CITRATE (PF) 250 MCG/5ML IJ SOLN
INTRAMUSCULAR | Status: DC | PRN
  Administered 2023-12-10 (×2): 50 ug via INTRAVENOUS

## 2023-12-10 MED ORDER — FENTANYL CITRATE PF 50 MCG/ML IJ SOSY: 25.0000 ug | PREFILLED_SYRINGE | INTRAMUSCULAR | Status: DC | PRN

## 2023-12-10 MED ORDER — MIDAZOLAM HCL 2 MG/2ML IJ SOLN
INTRAMUSCULAR | Status: DC | PRN
  Administered 2023-12-10: 1 mg via INTRAVENOUS

## 2023-12-10 MED ORDER — INSULIN ASPART 100 UNIT/ML IJ SOLN: 0.0000 [IU] | INTRAMUSCULAR | Status: DC | PRN

## 2023-12-10 MED ORDER — LIDOCAINE HCL (PF) 2 % IJ SOLN
INTRAMUSCULAR | Status: DC | PRN
  Administered 2023-12-10: 70 mg via INTRADERMAL

## 2023-12-10 MED ORDER — MORPHINE SULFATE (PF) 2 MG/ML IV SOLN: 2.0000 mg | INTRAVENOUS | Status: DC | PRN

## 2023-12-10 MED ORDER — ACETAMINOPHEN 500 MG PO TABS
1000.0000 mg | ORAL_TABLET | Freq: Once | ORAL | Status: AC
  Administered 2023-12-10: 1000 mg via ORAL
  Filled 2023-12-10: qty 2

## 2023-12-10 MED ORDER — DROPERIDOL 2.5 MG/ML IJ SOLN
0.6250 mg | Freq: Once | INTRAMUSCULAR | Status: DC | PRN
Start: 2023-12-10 — End: 2023-12-10

## 2023-12-10 MED ORDER — SODIUM CHLORIDE 0.9 % IV SOLN
250.0000 mL | INTRAVENOUS | Status: DC | PRN
Start: 2023-12-10 — End: 2023-12-10

## 2023-12-10 MED ORDER — GEMCITABINE CHEMO FOR BLADDER INSTILLATION 2000 MG
2000.0000 mg | Freq: Once | INTRAVENOUS | Status: DC
  Filled 2023-12-10: qty 52.6

## 2023-12-10 MED ORDER — DEXAMETHASONE SODIUM PHOSPHATE 10 MG/ML IJ SOLN
INTRAMUSCULAR | Status: DC | PRN
  Administered 2023-12-10: 10 mg via INTRAVENOUS

## 2023-12-10 MED ORDER — PHENYLEPHRINE 80 MCG/ML (10ML) SYRINGE FOR IV PUSH (FOR BLOOD PRESSURE SUPPORT)
PREFILLED_SYRINGE | INTRAVENOUS | Status: DC | PRN
  Administered 2023-12-10: 80 ug via INTRAVENOUS

## 2023-12-10 MED ORDER — CHLORHEXIDINE GLUCONATE 0.12 % MT SOLN
15.0000 mL | Freq: Once | OROMUCOSAL | Status: AC
  Administered 2023-12-10: 15 mL via OROMUCOSAL

## 2023-12-10 MED ORDER — SUGAMMADEX SODIUM 200 MG/2ML IV SOLN
INTRAVENOUS | Status: DC | PRN
  Administered 2023-12-10: 300 mg via INTRAVENOUS

## 2023-12-10 MED ORDER — ONDANSETRON HCL 4 MG/2ML IJ SOLN
INTRAMUSCULAR | Status: DC | PRN
  Administered 2023-12-10: 4 mg via INTRAVENOUS

## 2023-12-10 MED ORDER — PROPOFOL 10 MG/ML IV BOLUS
INTRAVENOUS | Status: DC | PRN
  Administered 2023-12-10: 110 mg via INTRAVENOUS
  Administered 2023-12-10: 20 mg via INTRAVENOUS

## 2023-12-10 MED ORDER — PROPOFOL 10 MG/ML IV BOLUS
INTRAVENOUS | Status: AC
Start: 2023-12-10 — End: 2023-12-10
  Filled 2023-12-10: qty 20

## 2023-12-10 MED ORDER — MIDAZOLAM HCL 2 MG/2ML IJ SOLN
INTRAMUSCULAR | Status: AC
  Filled 2023-12-10: qty 2

## 2023-12-10 MED ORDER — ROCURONIUM BROMIDE 10 MG/ML (PF) SYRINGE
PREFILLED_SYRINGE | INTRAVENOUS | Status: DC | PRN
  Administered 2023-12-10: 40 mg via INTRAVENOUS

## 2023-12-10 MED ORDER — OXYCODONE HCL 5 MG PO TABS: 5.0000 mg | ORAL_TABLET | ORAL | Status: DC | PRN

## 2023-12-10 MED ORDER — ACETAMINOPHEN 650 MG RE SUPP
650.0000 mg | RECTAL | Status: DC | PRN
Start: 2023-12-10 — End: 2023-12-10

## 2023-12-10 MED ORDER — ACETAMINOPHEN 325 MG PO TABS: 650.0000 mg | ORAL_TABLET | ORAL | Status: DC | PRN

## 2023-12-10 SURGICAL SUPPLY — 21 items
BAG URINE DRAIN 2000ML AR STRL (UROLOGICAL SUPPLIES) IMPLANT
BAG URO CATCHER STRL LF (MISCELLANEOUS) ×2 IMPLANT
CATH FOLEY 3WAY 30CC 22FR (CATHETERS) IMPLANT
CATH TIEMANN FOLEY 18FR 5CC (CATHETERS) IMPLANT
CATH URETL OPEN 5X70 (CATHETERS) IMPLANT
CLOTH BEACON ORANGE TIMEOUT ST (SAFETY) ×2 IMPLANT
DRAPE FOOT SWITCH (DRAPES) ×2 IMPLANT
ELECT REM PT RETURN 15FT ADLT (MISCELLANEOUS) ×2 IMPLANT
GLOVE SURG SS PI 8.0 STRL IVOR (GLOVE) ×2 IMPLANT
GOWN STRL SURGICAL XL XLNG (GOWN DISPOSABLE) ×2 IMPLANT
GUIDEWIRE STR DUAL SENSOR (WIRE) ×2 IMPLANT
HOLDER FOLEY CATH W/STRAP (MISCELLANEOUS) IMPLANT
KIT TURNOVER KIT A (KITS) ×2 IMPLANT
LOOP CUT BIPOLAR 24F LRG (ELECTROSURGICAL) IMPLANT
MANIFOLD NEPTUNE II (INSTRUMENTS) ×2 IMPLANT
PACK CYSTO (CUSTOM PROCEDURE TRAY) ×2 IMPLANT
SUT ETHILON 3 0 PS 1 (SUTURE) IMPLANT
SYR 30ML LL (SYRINGE) IMPLANT
SYRINGE TOOMEY IRRIG 70ML (MISCELLANEOUS) IMPLANT
TUBING CONNECTING 10 (TUBING) ×2 IMPLANT
TUBING UROLOGY SET (TUBING) ×2 IMPLANT

## 2023-12-10 NOTE — Anesthesia Procedure Notes (Signed)
 Procedure Name: Intubation Date/Time: 12/10/2023 7:40 AM  Performed by: Cena Epps, CRNAPre-anesthesia Checklist: Patient identified, Emergency Drugs available, Suction available and Patient being monitored Patient Re-evaluated:Patient Re-evaluated prior to induction Oxygen Delivery Method: Circle System Utilized Preoxygenation: Pre-oxygenation with 100% oxygen Induction Type: IV induction Ventilation: Mask ventilation without difficulty Laryngoscope Size: Glidescope and 4 (expected difficult airway) Grade View: Grade II Tube type: Oral Tube size: 7.5 mm Number of attempts: 1 Airway Equipment and Method: Stylet and Oral airway Placement Confirmation: ETT inserted through vocal cords under direct vision, positive ETCO2 and breath sounds checked- equal and bilateral Secured at: 23 cm Tube secured with: Tape Dental Injury: Teeth and Oropharynx as per pre-operative assessment

## 2023-12-10 NOTE — Discharge Instructions (Addendum)
You may remove the catheter in the morning.

## 2023-12-10 NOTE — Transfer of Care (Signed)
 Immediate Anesthesia Transfer of Care Note  Patient: Jason Bailey  Procedure(s) Performed: TURBT, WITH CHEMOTHERAPEUTIC AGENT INSTILLATION INTO BLADDER (Bladder) CYSTOSCOPY, WITH RETROGRADE PYELOGRAM (Bilateral: Bladder)  Patient Location: PACU  Anesthesia Type:General  Level of Consciousness: awake, alert , and oriented  Airway & Oxygen Therapy: Patient Spontanous Breathing and Patient connected to nasal cannula oxygen  Post-op Assessment: Report given to RN and Post -op Vital signs reviewed and stable  Post vital signs: Reviewed and stable  Last Vitals:  Vitals Value Taken Time  BP 158/59 12/10/23 08:26  Temp    Pulse 70 12/10/23 08:27  Resp 23 12/10/23 08:27  SpO2 100 % 12/10/23 08:27  Vitals shown include unfiled device data.  Last Pain:  Vitals:   12/10/23 0546  TempSrc: Oral  PainSc: 0-No pain         Complications: No notable events documented.

## 2023-12-10 NOTE — Op Note (Signed)
 Procedure: 1.  Cystoscopy with bilateral retrograde pyelograms and interpretation. 2.  Transurethral resection of 6 mm bladder tumor from the dome. 3.  Application of fluoroscopy. 4.  Instillation of gemcitabine  in PACU.  Pre-Op diagnosis: Bladder tumor of the right dome.  Postop diagnosis: Same.  Surgeon: Dr. Norleen Seltzer.  Anesthesia: General.  Specimen: Bladder tumor chip.  Drain: 18 [place] Foley catheter.  EBL: None.  Complications: None.  Indications: Patient is a 73 year old male with a history of bladder tumors he was found on recent surveillance cystoscopy to have a recurrent lesion on the right dome.  It was felt that bilateral retrograde pyelography, tumor resection instillation of gemcitabine  were indicated.  Procedure: He was taken operating room was given antibiotic.  A general anesthetic was induced.  He was placed in lithotomy position and fitted with PAS hose.  His perineum and genitalia were prepped with Betadine solution and draped in usual sterile fashion.  Cystoscopy was performed using a 21 [place] scope and 30 degree lens.  Examination revealed a normal urethra.  There was a mild membranous stricture secondary to radiation for his prior prostate cancer.  Prostatic urethra short with mucosal blanching and a slightly high bladder neck.  Examination the bladder demonstrated some radiation changes in the trigone area.  The left ureteral orifice was unremarkable.  The right ureteral orifice was somewhat lateral and patulous from prior resection.  There was mild bladder wall trabeculation.  On the right dome there was a lesion approximately 6 mm in size it was pale but papillary and concerning for recurrent tumor.  No other lesions were identified.  Bilateral retrograde pyelography was performed with a 5 [place] open-ended catheter and Omnipaque .  The left retrograde pyelogram demonstrated normal ureter and intrarenal collecting system without filling defects.  The right  retrograde pyelogram demonstrated a normal ureter and intrarenal collecting system without filling defects.  The cystoscope was then removed and the urethra is calibrated to 32 [place] with [employer].  A continuous-flow resectoscope sheath was then placed with the aid of visual obturator.  The visual obturator was replaced and the [facility] handle with a bipolar loop and a 30 degree lens.  Saline was used the irrigant.  The lesion on the right dome was then resected and the chip was collected.  The resection site was then fulgurated with a margin of surrounding mucosa.  The lesion was approximately 6 mm.  Inspection of the resection bed demonstrated a small amount of fat but no perforation.  It was felt a Foley catheter was indicated.  Once hemostasis was secured, the resectoscope was removed and an 78 French Foley catheter was inserted without difficulty.  Balloon was filled with 10 mL of sterile fluid.  Cath was placed to straight drainage.  Was taken down from lithotomy position, his anesthetic was reversed and he was moved recovery in stable condition.  In the recovery room his bladder was instilled with 2000 mg of gemcitabine  and 50 mL of diluent .  This was left indwelling for 1 hour and the bladder was then drained.  He will be discharged home with the Foley with instructions to remove it in the morning.  There were no complications.

## 2023-12-10 NOTE — Interval H&P Note (Signed)
 History and Physical Interval Note:  12/10/2023 7:18 AM  Jason Bailey  has presented today for surgery, with the diagnosis of BLADDER CANCER.  The various methods of treatment have been discussed with the patient and family. After consideration of risks, benefits and other options for treatment, the patient has consented to  Procedure(s) with comments: TURBT, WITH CHEMOTHERAPEUTIC AGENT INSTILLATION INTO BLADDER (N/A) - INSTLL GEMCITABINE  CYSTOSCOPY, WITH RETROGRADE PYELOGRAM (Bilateral) as a surgical intervention.  The patient's history has been reviewed, patient examined, no change in status, stable for surgery.  I have reviewed the patient's chart and labs.  Questions were answered to the patient's satisfaction.     Chasady Longwell

## 2023-12-11 ENCOUNTER — Encounter (HOSPITAL_COMMUNITY): Payer: Self-pay | Admitting: Urology

## 2023-12-11 LAB — SURGICAL PATHOLOGY

## 2023-12-11 NOTE — Anesthesia Postprocedure Evaluation (Signed)
 Anesthesia Post Note  Patient: Jason Bailey  Procedure(s) Performed: TURBT, WITH CHEMOTHERAPEUTIC AGENT INSTILLATION INTO BLADDER (Bladder) CYSTOSCOPY, WITH RETROGRADE PYELOGRAM (Bilateral: Bladder)     Patient location during evaluation: PACU Anesthesia Type: General Level of consciousness: sedated and patient cooperative Pain management: pain level controlled Vital Signs Assessment: post-procedure vital signs reviewed and stable Respiratory status: spontaneous breathing Cardiovascular status: stable Anesthetic complications: no   No notable events documented.  Last Vitals:  Vitals:   12/10/23 0930 12/10/23 1001  BP:  130/78  Pulse:  66  Resp:  17  Temp: 36.7 C   SpO2:  100%    Last Pain:  Vitals:   12/10/23 1001  TempSrc:   PainSc: 0-No pain                 Norleen Pope

## 2023-12-16 ENCOUNTER — Ambulatory Visit: Attending: Cardiovascular Disease | Admitting: Cardiovascular Disease

## 2023-12-16 ENCOUNTER — Encounter: Payer: Self-pay | Admitting: Cardiovascular Disease

## 2023-12-16 VITALS — BP 110/64 | HR 64 | Ht 67.0 in | Wt 156.6 lb

## 2023-12-16 DIAGNOSIS — R011 Cardiac murmur, unspecified: Secondary | ICD-10-CM

## 2023-12-16 DIAGNOSIS — I739 Peripheral vascular disease, unspecified: Secondary | ICD-10-CM

## 2023-12-16 DIAGNOSIS — Z951 Presence of aortocoronary bypass graft: Secondary | ICD-10-CM | POA: Diagnosis not present

## 2023-12-16 DIAGNOSIS — G458 Other transient cerebral ischemic attacks and related syndromes: Secondary | ICD-10-CM

## 2023-12-16 NOTE — Patient Instructions (Addendum)
 Medication Instructions:  Your physician recommends that you continue on your current medications as directed. Please refer to the Current Medication list given to you today.  *If you need a refill on your cardiac medications before your next appointment, please call your pharmacy*  Lab Work: If you have labs (blood work) drawn today and your tests are completely normal, you will receive your results only by: MyChart Message (if you have MyChart) OR A paper copy in the mail If you have any lab test that is abnormal or we need to change your treatment, we will call you to review the results.  Testing/Procedures: Your physician has requested that you have an ankle brachial index (ABI) and lower extremity duplex. During this test an ultrasound and blood pressure cuff are used to evaluate the arteries that supply the legs with blood. Allow thirty minutes for this exam. There are no restrictions or special instructions.  Please note: We ask at that you not bring children with you during ultrasound (echo/ vascular) testing. Due to room size and safety concerns, children are not allowed in the ultrasound rooms during exams. Our front office staff cannot provide observation of children in our lobby area while testing is being conducted. An adult accompanying a patient to their appointment will only be allowed in the ultrasound room at the discretion of the ultrasound technician under special circumstances. We apologize for any inconvenience.  Follow-Up: At [facility], you and your health needs are our priority.  As part of our continuing mission to provide you with exceptional heart care, our providers are all part of one team.  This team includes your primary Cardiologist (physician) and Advanced Practice Providers or APPs (Physician Assistants and Nurse Practitioners) who all work together to provide you with the care you need, when you need it.  Your next appointment:   6  months  Provider:   Maude Emmer, MD    We recommend signing up for the patient portal called MyChart.  Sign up information is provided on this After Visit Summary.  MyChart is used to connect with patients for Virtual Visits (Telemedicine).  Patients are able to view lab/test results, encounter notes, upcoming appointments, etc.  Non-urgent messages can be sent to your provider as well.   To learn more about what you can do with MyChart, go to [email or web address].

## 2023-12-30 ENCOUNTER — Ambulatory Visit: Payer: Self-pay | Admitting: Cardiovascular Disease

## 2023-12-30 ENCOUNTER — Ambulatory Visit (HOSPITAL_BASED_OUTPATIENT_CLINIC_OR_DEPARTMENT_OTHER)
Admission: RE | Admit: 2023-12-30 | Discharge: 2023-12-30 | Disposition: A | Discharge status: Home / Self Care | Source: Ambulatory Visit | Attending: Cardiovascular Disease | Admitting: Cardiovascular Disease

## 2023-12-30 ENCOUNTER — Ambulatory Visit (HOSPITAL_COMMUNITY)
Admission: RE | Admit: 2023-12-30 | Discharge: 2023-12-30 | Disposition: A | Discharge status: Home / Self Care | Source: Ambulatory Visit | Attending: Cardiovascular Disease | Admitting: Cardiovascular Disease

## 2023-12-30 DIAGNOSIS — I739 Peripheral vascular disease, unspecified: Secondary | ICD-10-CM

## 2023-12-30 LAB — VAS US ABI WITH/WO TBI: Right ABI: 0.66

## 2023-12-31 ENCOUNTER — Telehealth: Payer: Self-pay | Admitting: Vascular Surgery

## 2024-01-01 NOTE — Telephone Encounter (Signed)
 Pt called back to schedule appt for 09/09

## 2024-01-01 NOTE — Telephone Encounter (Signed)
  Pt is returning call, per pt to call her wife back since he is doing some errands today. Her phone number [phone]

## 2024-01-03 NOTE — Telephone Encounter (Signed)
 Patient returned RN's call regarding results.

## 2024-01-15 ENCOUNTER — Ambulatory Visit: Admitting: Endocrinology

## 2024-02-18 ENCOUNTER — Encounter: Payer: Self-pay | Admitting: Vascular Surgery

## 2024-02-18 ENCOUNTER — Ambulatory Visit: Attending: Vascular Surgery | Admitting: Vascular Surgery

## 2024-02-18 VITALS — BP 143/67 | HR 67 | Temp 98.5°F | Ht 67.0 in | Wt 160.0 lb

## 2024-02-18 DIAGNOSIS — I70219 Atherosclerosis of native arteries of extremities with intermittent claudication, unspecified extremity: Secondary | ICD-10-CM | POA: Insufficient documentation

## 2024-02-18 DIAGNOSIS — I70213 Atherosclerosis of native arteries of extremities with intermittent claudication, bilateral legs: Secondary | ICD-10-CM | POA: Diagnosis not present

## 2024-02-18 NOTE — Progress Notes (Signed)
 Patient name: Jason Bailey MRN: [id number] DOB: January 27, 1951 Sex: male  REASON FOR CONSULT: PAD  HPI: Jason Bailey is a 73 y.o. male, with history CAD, diabetes, hypertension, hyperlipidemia that presents for evaluation of PAD.  Now referred by Dr. Nishan with cardiology due to concern for worsening PAD.  He did have ABIs on 12/30/2023 that were 0.66 on the right dampened monophasic and noncompressible on the left.  States he gets pain in both legs when walking.  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  No rest pain or tissue loss.  Doing yardwork and landscaping as well without limitation.  Previously seen by Dr. 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  He was offered a potential vertebral artery to common carotid artery transposition if he had worsening symptoms due to significant right subclavian stenosis and significant right vertebral artery stenosis.  His left vertebral artery is occluded and Dr. Eliza suggested he should check his blood pressure in the left arm.  Last seen with no worsening symptoms with only 1-2 episodes of dizziness per week.  Past Medical History:  Diagnosis Date   Cancer [facility])    prostate   Chest pain    Coronary artery disease    Diabetes mellitus    Heart attack (HCC)    approx. 2009   Heart murmur    History of kidney stones    Hyperlipidemia    Hypertension    Malaise and fatigue    Osteoarthritis    Pneumonia    Right foot drop    Right shoulder pain    Sleep apnea    Subclavian steal syndrome    ONLY DO BPs IN THE LEFT ARM   Vision abnormalities     Past Surgical History:  Procedure Laterality Date   ANTERIOR CERVICAL DECOMP/DISCECTOMY FUSION N/A 03/26/2019   Procedure: ANTERIOR CERVICAL DECOMPRESSION/DISCECTOMY FUSION ONE LEVEL, CERVICAL FIVE - CERVICAL SIX;  Surgeon: Alix Charleston, MD;  Location: [facility] OR;  Service:  Neurosurgery;  Laterality: N/A;  ANTERIOR CERVICAL DECOMPRESSION/DISCECTOMY FUSION ONE LEVEL, CERVICAL FIVE - CERVICAL SIX    COLONOSCOPY WITH PROPOFOL  N/A 06/22/2022   Procedure: COLONOSCOPY WITH PROPOFOL ;  Surgeon: Saintclair Jasper, MD;  Location: [facility] ENDOSCOPY;  Service: Gastroenterology;  Laterality: N/A;   CORONARY ARTERY BYPASS GRAFT  02/14/2007   x 4  at [facility]   CYSTOSCOPY W/ RETROGRADES Bilateral 12/10/2023   Procedure: CYSTOSCOPY, WITH RETROGRADE PYELOGRAM;  Surgeon: Watt Rush, MD;  Location: [facility] ORS;  Service: Urology;  Laterality: Bilateral;   FRACTURE SURGERY Right 1995   ORIF of shoulder ,    later had hardware removed.   KNEE SURGERY Right 1995   ORIF after tree fell on him   POLYPECTOMY  06/22/2022   Procedure: POLYPECTOMY;  Surgeon: Saintclair Jasper, MD;  Location: [facility] ENDOSCOPY;  Service: Gastroenterology;;   TONSILLECTOMY      Family History  Problem Relation Age of Onset   Breast cancer Mother    Liver cancer Father    Renal cancer Sister    Diabetes Maternal Grandfather     SOCIAL HISTORY: Social History   Socioeconomic History   Marital status: Married    Spouse name: Not on file   Number of children: Not on file   Years of education: Not on file   Highest education level: Not on file  Occupational History  Not on file  Tobacco Use   Smoking status: Former   Smokeless tobacco: Never   Tobacco comments:    quit 4 yrs ago  Vaping Use   Vaping status: Never Used  Substance and Sexual Activity   Alcohol use: No   Drug use: No   Sexual activity: Not Currently  Other Topics Concern   Not on file  Social History Narrative   Not on file   Social Drivers of Health   Financial Resource Strain: Not on file  Food Insecurity: No Food Insecurity (07/04/2023)   [facility]   Hunger Vital Sign    Within the past 12 months, you worried that your food would run out before you got the money to buy more.: Never true    Within the past 12 months, the food  you bought just didn't last and you didn't have money to get more.: Never true  Transportation Needs: No Transportation Needs (07/04/2023)   [facility]   PRAPARE - Transportation    Lack of Transportation (Medical): No    Lack of Transportation (Non-Medical): No  Physical Activity: Not on file  Stress: Not on file  Social Connections: Not on file  Intimate Partner Violence: Not At Risk (06/20/2022)   Humiliation, Afraid, Rape, and Kick questionnaire    Fear of Current or Ex-Partner: No    Emotionally Abused: No    Physically Abused: No    Sexually Abused: No    No Known Allergies  Current Outpatient Medications  Medication Sig Dispense Refill   albuterol (VENTOLIN HFA) 108 (90 Base) MCG/ACT inhaler Inhale 2 puffs into the lungs every 6 (six) hours as needed for wheezing or shortness of breath.     aspirin  EC 81 MG tablet Take 1 tablet (81 mg total) by mouth daily.     cholecalciferol (VITAMIN D3) 25 MCG (1000 UNIT) tablet Take 1,000 Units by mouth daily.     empagliflozin  (JARDIANCE ) 25 MG TABS tablet Take 1 tablet (25 mg total) by mouth in the morning. 90 tablet 3   glimepiride (AMARYL) 4 MG tablet Take 4 mg by mouth at bedtime.     lisinopril  (ZESTRIL ) 20 MG tablet Take 20 mg by mouth at bedtime.     metFORMIN  (GLUCOPHAGE -XR) 500 MG 24 hr tablet Take 2 tablets (1,000 mg total) by mouth 2 (two) times daily. 360 tablet 3   montelukast  (SINGULAIR ) 10 MG tablet Take 10 mg by mouth in the morning.     nitroGLYCERIN  (NITROSTAT ) 0.4 MG SL tablet Place 1 tablet (0.4 mg total) under the tongue every 5 (five) minutes as needed for chest pain. 25 tablet 3   pravastatin  (PRAVACHOL ) 40 MG tablet Take 1 tablet (40 mg total) by mouth daily. 30 tablet 1   testosterone cypionate (DEPOTESTOSTERONE CYPIONATE) 200 MG/ML injection Inject 100 mg into the muscle every 7 (seven) days.     vitamin B-12 (CYANOCOBALAMIN ) 500 MCG tablet Take 500 mcg by mouth in the morning.     No current  facility-administered medications for this visit.    REVIEW OF SYSTEMS:  [X]  denotes positive finding, [ ]  denotes negative finding Cardiac  Comments:  Chest pain or chest pressure:    Shortness of breath upon exertion:    Short of breath when lying flat:    Irregular heart rhythm:        Vascular    Pain in calf, thigh, or hip brought on by ambulation: x  Pain in feet at night that wakes you up from your sleep:     Blood clot in your veins:    Leg swelling:         Pulmonary    Oxygen at home:    Productive cough:     Wheezing:         Neurologic    Sudden weakness in arms or legs:     Sudden numbness in arms or legs:     Sudden onset of difficulty speaking or slurred speech:    Temporary loss of vision in one eye:     Problems with dizziness:         Gastrointestinal    Blood in stool:     Vomited blood:         Genitourinary    Burning when urinating:     Blood in urine:        Psychiatric    Major depression:         Hematologic    Bleeding problems:    Problems with blood clotting too easily:        Skin    Rashes or ulcers:        Constitutional    Fever or chills:      PHYSICAL EXAM: There were no vitals filed for this visit.  GENERAL: The patient is a well-nourished male, in no acute distress. The vital signs are documented above. CARDIAC: There is a regular rate and rhythm.  VASCULAR:  Bilateral femoral pulses palpable No palpable pedal pulses PULMONARY: No respiratory distress. ABDOMEN: Soft and non-tender. MUSCULOSKELETAL: There are no major deformities or cyanosis. NEUROLOGIC: No focal weakness or paresthesias are detected.  Cranial nerves II through XII grossly intact. SKIN: There are no ulcers or rashes noted. PSYCHIATRIC: The patient has a normal affect.  DATA:   ABI 12/30/23 0.66 the right dampened monophasic and noncompressible in the left   LOWER EXTREMITY ARTERIAL DUPLEX STUDY  Patient Name:  Jason Bailey  Date of Exam:    12/30/2023 Medical Rec #: [id number]      Accession #:    [id number] Date of Birth: Feb 18, 1951      Patient Gender: M Patient Age:   31 years Exam Location:  [address] Procedure:      VAS US  LOWER EXTREMITY ARTERIAL DUPLEX Referring Phys: MAUDE EMMER   --------------------------------------------------------------------------- -----   Indications: Claudication.  High Risk Factors: Hyperlipidemia, Diabetes, past history of smoking, coronary                    artery disease.    Current ABI: 0.66/English  Performing Technologist: Garnette Rockers    Examination Guidelines: A complete evaluation includes B-mode imaging, spectral Doppler, color Doppler, and power Doppler as needed of all accessible portions of each vessel. Bilateral testing is considered an integral part of a complete examination. Limited examinations for reoccurring indications may be performed as noted.      +-----------+--------+-----+---------------+----------+--------+ RIGHT      PSV cm/sRatioStenosis       Waveform  Comments +-----------+--------+-----+---------------+----------+--------+ EIA Distal 72           30-49% stenosisbiphasic           +-----------+--------+-----+---------------+----------+--------+ CFA Prox   87           30-49% stenosisbiphasic           +-----------+--------+-----+---------------+----------+--------+ CFA Mid    93           30-49%  stenosisbiphasic           +-----------+--------+-----+---------------+----------+--------+ CFA Distal 108          30-49% stenosisbiphasic           +-----------+--------+-----+---------------+----------+--------+ DFA        437          75-99% stenosisbiphasic           +-----------+--------+-----+---------------+----------+--------+ SFA Prox   141          30-49% stenosisbiphasic           +-----------+--------+-----+---------------+----------+--------+ SFA Mid    558          75-99%  stenosismonophasic         +-----------+--------+-----+---------------+----------+--------+ SFA Distal 240          50-74% stenosismonophasic         +-----------+--------+-----+---------------+----------+--------+ POP Prox   69                          monophasic         +-----------+--------+-----+---------------+----------+--------+ POP Distal 43                          monophasic         +-----------+--------+-----+---------------+----------+--------+ ATA Distal 41                          monophasic         +-----------+--------+-----+---------------+----------+--------+ PTA Distal 34                          monophasic         +-----------+--------+-----+---------------+----------+--------+ PERO Distal35                          monophasic         +-----------+--------+-----+---------------+----------+--------+  A focal velocity elevation of 437 cm/s was obtained at [facility] Origin with a VR of 4.0. Findings are characteristic of 75-99% stenosis. A 2nd focal velocity elevation was visualized, measuring 557 cm/s at SFA Mid with a VR of 6.2. Findings are characteristic  of 75-99% stenosis. A 3rd focal velocity elevation was visualized, measuring 240 cm/s at [facility] Dist with a VR of 2.8. Findings are characteristic of 50-74% stenosis.     +-----------+--------+-----+---------------+---------+--------+ LEFT       PSV cm/sRatioStenosis       Waveform Comments +-----------+--------+-----+---------------+---------+--------+ CFA Prox   105          30-49% stenosistriphasic         +-----------+--------+-----+---------------+---------+--------+ CFA Mid    139          30-49% stenosistriphasic         +-----------+--------+-----+---------------+---------+--------+ CFA Distal 154          30-49% stenosisbiphasic          +-----------+--------+-----+---------------+---------+--------+ DFA        203          30-49%  stenosistriphasic         +-----------+--------+-----+---------------+---------+--------+ SFA Prox   106          30-49% stenosisbiphasic          +-----------+--------+-----+---------------+---------+--------+ SFA Mid    407          50-74% stenosisbiphasic          +-----------+--------+-----+---------------+---------+--------+ SFA Distal 336  50-74% stenosisbiphasic          +-----------+--------+-----+---------------+---------+--------+ POP Prox   52           30-49% stenosisbiphasic          +-----------+--------+-----+---------------+---------+--------+ POP Mid    38                          biphasic          +-----------+--------+-----+---------------+---------+--------+ ATA Distal 27                          biphasic          +-----------+--------+-----+---------------+---------+--------+ PTA Distal 64                          biphasic          +-----------+--------+-----+---------------+---------+--------+ PERO Distal41                          biphasic          +-----------+--------+-----+---------------+---------+--------+  A focal velocity elevation of 407 cm/s was obtained at [facility] Mid with a VR of 3.3. Findings are characteristic of 50-74% stenosis. A 2nd focal velocity elevation was visualized, measuring 336 cm/s at [facility] Dist with a VR of 4.67. Findings are characteristic of 50-74% stenosis.      Summary: Right: 30-49% stenosis noted in the iliac segment. 30-49% stenosis noted in the common femoral artery. 75-99% stenosis noted in the deep femoral artery. 75-99% stenosis noted in the superficial femoral artery. 50-74% stenosis noted in the superficial femoral artery and/or popliteal artery.  Left: 30-49% stenosis noted in the common femoral artery. 30-49% stenosis noted in the deep femoral artery. 50-74% stenosis noted in the superficial femoral artery. 50-74% stenosis noted in the superficial femoral  artery and/or popliteal artery. 30-49% stenosis noted in the popliteal artery.    See table(s) above for measurements and observations.  Suggest follow up study in When clinically indicated. Right ABI 0.66 with markedly dampened monophasic waveform pattern, left ABI noncompressible with severely abnormal monophasic waveform pattern and are reported separately. Suggest Peripheral Vascular Consult.  Electronically signed by Gordy Bergamo MD on 12/30/2023 at 4:42:39 PM.    Assessment/Plan:  73 y.o. male, with history CAD, diabetes, hypertension, hyperlipidemia that presents for evaluation of PAD.  Now referred by Dr. Nishan with cardiology due to concern for worsening PAD.  He did have ABIs on 12/30/2023 that were 0.66 on the right dampened monophasic and noncompressible on the left.  Symptoms are consistent with claudication based on his description.  I discussed he certainly has evidence of PAD based on his noninvasive studies.  He has a high-grade right SFA stenosis and a moderate left SFA stenosis by duplex.  That being said I discussed claudication is typically managed conservatively until he has disabling symptoms.  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  Ultimately we agreed on conservative management and I discussed walking therapies with him daily.  I offered Pletal but he wants to try exercise first and try and avoid additional medications.  I will see him in 6 months with ABIs.  If worsening symptoms in the future can pursue angiography as we discussed.   Lonni PARAS.  Gretta, MD [facility] Office: [phone]

## 2024-02-19 ENCOUNTER — Other Ambulatory Visit: Payer: Self-pay

## 2024-02-19 DIAGNOSIS — I70213 Atherosclerosis of native arteries of extremities with intermittent claudication, bilateral legs: Secondary | ICD-10-CM

## 2024-05-22 ENCOUNTER — Encounter: Payer: Self-pay | Admitting: Cardiovascular Disease

## 2024-08-18 ENCOUNTER — Encounter (HOSPITAL_COMMUNITY)

## 2024-08-18 ENCOUNTER — Ambulatory Visit: Admitting: Vascular Surgery
